# Patient Record
Sex: Male | Born: 1935
Health system: Southern US, Community
[De-identification: ages and names within clinical notes are randomized; demographics above are authoritative.]

## PROBLEM LIST (undated history)

## (undated) DIAGNOSIS — J449 Chronic obstructive pulmonary disease, unspecified: Secondary | ICD-10-CM

## (undated) DIAGNOSIS — K635 Polyp of colon: Secondary | ICD-10-CM

## (undated) DIAGNOSIS — I2510 Atherosclerotic heart disease of native coronary artery without angina pectoris: Secondary | ICD-10-CM

## (undated) DIAGNOSIS — N400 Enlarged prostate without lower urinary tract symptoms: Secondary | ICD-10-CM

## (undated) DIAGNOSIS — D649 Anemia, unspecified: Secondary | ICD-10-CM

## (undated) DIAGNOSIS — K5792 Diverticulitis of intestine, part unspecified, without perforation or abscess without bleeding: Secondary | ICD-10-CM

## (undated) DIAGNOSIS — K5790 Diverticulosis of intestine, part unspecified, without perforation or abscess without bleeding: Secondary | ICD-10-CM

## (undated) DIAGNOSIS — U071 COVID-19: Secondary | ICD-10-CM

## (undated) DIAGNOSIS — M109 Gout, unspecified: Secondary | ICD-10-CM

## (undated) DIAGNOSIS — M1990 Unspecified osteoarthritis, unspecified site: Secondary | ICD-10-CM

## (undated) DIAGNOSIS — G4730 Sleep apnea, unspecified: Secondary | ICD-10-CM

## (undated) DIAGNOSIS — K649 Unspecified hemorrhoids: Secondary | ICD-10-CM

## (undated) DIAGNOSIS — K219 Gastro-esophageal reflux disease without esophagitis: Secondary | ICD-10-CM

## (undated) DIAGNOSIS — K449 Diaphragmatic hernia without obstruction or gangrene: Secondary | ICD-10-CM

## (undated) DIAGNOSIS — Z8719 Personal history of other diseases of the digestive system: Secondary | ICD-10-CM

## (undated) DIAGNOSIS — I4891 Unspecified atrial fibrillation: Secondary | ICD-10-CM

## (undated) DIAGNOSIS — J439 Emphysema, unspecified: Secondary | ICD-10-CM

## (undated) HISTORY — DX: Personal history of other diseases of the digestive system: Z87.19

## (undated) HISTORY — DX: Gastro-esophageal reflux disease without esophagitis: K21.9

## (undated) HISTORY — DX: Unspecified hemorrhoids: K64.9

## (undated) HISTORY — PX: KIDNEY STONE SURGERY: SHX686

## (undated) HISTORY — DX: Polyp of colon: K63.5

## (undated) HISTORY — DX: Chronic obstructive pulmonary disease, unspecified: J44.9

## (undated) HISTORY — DX: Benign prostatic hyperplasia without lower urinary tract symptoms: N40.0

## (undated) HISTORY — DX: Diverticulitis of intestine, part unspecified, without perforation or abscess without bleeding: K57.92

## (undated) HISTORY — PX: HEMORROIDECTOMY: SUR656

## (undated) HISTORY — DX: Diverticulosis of intestine, part unspecified, without perforation or abscess without bleeding: K57.90

## (undated) HISTORY — DX: Atherosclerotic heart disease of native coronary artery without angina pectoris: I25.10

## (undated) HISTORY — DX: COVID-19: U07.1

## (undated) HISTORY — DX: Emphysema, unspecified: J43.9

## (undated) HISTORY — DX: Anemia, unspecified: D64.9

## (undated) HISTORY — DX: Diaphragmatic hernia without obstruction or gangrene: K44.9

## (undated) HISTORY — DX: Gout, unspecified: M10.9

## (undated) HISTORY — DX: Unspecified atrial fibrillation: I48.91

## (undated) HISTORY — DX: Unspecified osteoarthritis, unspecified site: M19.90

---

## 2011-03-05 DIAGNOSIS — J449 Chronic obstructive pulmonary disease, unspecified: Secondary | ICD-10-CM | POA: Insufficient documentation

## 2011-03-27 DIAGNOSIS — K921 Melena: Secondary | ICD-10-CM | POA: Insufficient documentation

## 2011-08-31 DIAGNOSIS — J449 Chronic obstructive pulmonary disease, unspecified: Secondary | ICD-10-CM | POA: Insufficient documentation

## 2011-08-31 DIAGNOSIS — R0600 Dyspnea, unspecified: Secondary | ICD-10-CM | POA: Insufficient documentation

## 2012-02-27 DIAGNOSIS — R21 Rash and other nonspecific skin eruption: Secondary | ICD-10-CM | POA: Insufficient documentation

## 2012-04-19 DIAGNOSIS — K222 Esophageal obstruction: Secondary | ICD-10-CM | POA: Insufficient documentation

## 2012-04-19 DIAGNOSIS — Z860100 Personal history of colon polyps, unspecified: Secondary | ICD-10-CM | POA: Insufficient documentation

## 2013-05-01 DIAGNOSIS — R109 Unspecified abdominal pain: Secondary | ICD-10-CM | POA: Insufficient documentation

## 2014-04-10 DIAGNOSIS — R1013 Epigastric pain: Secondary | ICD-10-CM | POA: Insufficient documentation

## 2014-10-19 DIAGNOSIS — K429 Umbilical hernia without obstruction or gangrene: Secondary | ICD-10-CM | POA: Insufficient documentation

## 2015-04-18 DIAGNOSIS — L309 Dermatitis, unspecified: Secondary | ICD-10-CM | POA: Insufficient documentation

## 2015-05-22 DIAGNOSIS — M1712 Unilateral primary osteoarthritis, left knee: Secondary | ICD-10-CM | POA: Insufficient documentation

## 2015-05-22 DIAGNOSIS — M7062 Trochanteric bursitis, left hip: Secondary | ICD-10-CM | POA: Insufficient documentation

## 2015-05-22 DIAGNOSIS — M19011 Primary osteoarthritis, right shoulder: Secondary | ICD-10-CM | POA: Insufficient documentation

## 2017-07-05 DIAGNOSIS — I4891 Unspecified atrial fibrillation: Secondary | ICD-10-CM | POA: Insufficient documentation

## 2017-08-26 ENCOUNTER — Ambulatory Visit (INDEPENDENT_AMBULATORY_CARE_PROVIDER_SITE_OTHER): Payer: Medicare Other | Admitting: Cardiology

## 2017-08-26 ENCOUNTER — Encounter: Payer: Self-pay | Admitting: Cardiology

## 2017-08-26 VITALS — BP 106/68 | HR 76 | Ht 65.0 in | Wt 192.0 lb

## 2017-08-26 DIAGNOSIS — I4819 Other persistent atrial fibrillation: Secondary | ICD-10-CM

## 2017-08-26 DIAGNOSIS — R079 Chest pain, unspecified: Secondary | ICD-10-CM

## 2017-08-26 DIAGNOSIS — I481 Persistent atrial fibrillation: Secondary | ICD-10-CM | POA: Diagnosis not present

## 2017-08-26 NOTE — Addendum Note (Signed)
Addended by: Baird LyonsPRICE, Renesmee Raine L on: 08/26/2017 04:06 PM   Modules accepted: Orders

## 2017-08-26 NOTE — Progress Notes (Signed)
Electrophysiology Office Note   Date:  08/26/2017   ID:  Ronnie Torres, DOB 10/12/1929, MRN [id number]  PCP:  Jim LikeWilson, Benjamin, MD  Cardiologist:   Primary Electrophysiologist:  Mihran Lebarron Jorja LoaMartin Lynnsey Barbara, MD    No chief complaint on file.    History of Present Illness: Ronnie Torres is a 84 y.o. male who is being seen today for the evaluation of atrial fibrillation at the request of [name]. Presenting today for electrophysiology evaluation.  He has a history of paroxysmal atrial fibrillation on Eliquis and amiodarone, GERD, COPD, BPH, and intermittent lower extremity edema.  His main complaint today is of weakness and fatigue.  He says that he went into atrial fibrillation a few months ago, which is when the symptoms started.  He is also been more short of breath than usual.  He has tried amiodarone, but did not stay in normal rhythm.  He is also been having some chest pain.  He has had multiple episodes in the past.  He had a Myoview which was unremarkable.  He continues to have substernal chest pain that radiates to his neck.    Today, he denies symptoms of palpitations,  orthopnea, PND, lower extremity edema, claudication, dizziness, presyncope, syncope, bleeding, or neurologic sequela. The patient is tolerating medications without difficulties.    Past Medical History:  Diagnosis Date  . Acid reflux   . Arthritis   . COPD (chronic obstructive pulmonary disease) (HCC)   . Emphysema lung (HCC)   . Hiatal hernia    History reviewed. No pertinent surgical history.   Current Outpatient Medications  Medication Sig Dispense Refill  . albuterol (PROVENTIL HFA;VENTOLIN HFA) 108 (90 Base) MCG/ACT inhaler Inhale 2 puffs into the lungs every 4 (four) hours as needed.    Marland Kitchen. apixaban (ELIQUIS) 5 MG TABS tablet Take 1 tablet by mouth 2 (two) times daily.    . budesonide-formoterol (SYMBICORT) 160-4.5 MCG/ACT inhaler Inhale 2 puffs into the lungs 2 (two) times daily.    . ferrous sulfate 325 (65  FE) MG tablet Take 1 tablet by mouth daily.    . furosemide (LASIX) 40 MG tablet Take 1 tablet by mouth daily.  1  . gabapentin (NEURONTIN) 100 MG capsule Take 1 capsule by mouth 2 (two) times daily.    . meclizine (ANTIVERT) 12.5 MG tablet Take 12.5 mg by mouth 3 (three) times daily as needed for dizziness.    . metoprolol tartrate (LOPRESSOR) 25 MG tablet Take 0.5 tablets by mouth 2 (two) times daily.    . midodrine (PROAMATINE) 2.5 MG tablet Take 1 tablet by mouth 2 (two) times daily.    Marland Kitchen. omeprazole (PRILOSEC) 20 MG capsule Take 1 capsule by mouth 2 (two) times daily.    . tamsulosin (FLOMAX) 0.4 MG CAPS capsule Take 1 capsule by mouth 2 (two) times daily.     No current facility-administered medications for this visit.     Allergies:   Shellfish allergy; Sulfa antibiotics; and Trazodone and nefazodone   Social History:  The patient  reports that he quit smoking about 44 years ago. He has never used smokeless tobacco. He reports that he does not drink alcohol or use drugs.   Family History:  The patient's family history includes Atrial fibrillation in his brother; CVA in his father; Cancer in his brother; Dementia in his sister and sister; Other in his mother; Pancreatic cancer in his brother.    ROS:  Please see the history of present illness.   Otherwise, review  of systems is positive for leg swelling, palpitations, hearing loss, shortness of breath, back pain, muscle pain, balance problems, rash, easy bruising.   All other systems are reviewed and negative.    PHYSICAL EXAM: VS:  BP 106/68   Pulse 76   Ht 5\' 5"  (1.651 m)   Wt 192 lb (87.1 kg)   SpO2 94%   BMI 31.95 kg/m  , BMI Body mass index is 31.95 kg/m. GEN: Well nourished, well developed, in no acute distress  HEENT: normal  Neck: no JVD, carotid bruits, or masses Cardiac: iRRR; no murmurs, rubs, or gallops,no edema  Respiratory:  clear to auscultation bilaterally, normal work of breathing GI: soft, nontender,  nondistended, + BS MS: no deformity or atrophy  Skin: warm and dry Neuro:  Strength and sensation are intact Psych: euthymic mood, full affect  EKG:  EKG is ordered today. Personal review of the ekg ordered shows atrial fibrillation, rate 76, PVCs multifocal    Recent Labs: No results found for requested labs within last 8760 hours.    Lipid Panel  No results found for: CHOL, TRIG, HDL, CHOLHDL, VLDL, LDLCALC, LDLDIRECT   Wt Readings from Last 3 Encounters:  08/26/17 192 lb (87.1 kg)      Other studies Reviewed: Additional studies/ records that were reviewed today include: Myoview 06/29/2017 Review of the above records today demonstrates:  No inducible ischemia No wall motion abnormalities Normal left ventricular function   ASSESSMENT AND PLAN:  1.  Persistent atrial fibrillation: Has tried amiodarone in the past which did not convert him to sinus rhythm.  At this point, it does not appear that he has had an echocardiogram.  [name] order one today.  He is also weak, fatigue, and short of breath which could be due to his atrial fibrillation.  He [name] likely require rhythm control.  We [name] try with dofetilide pending his echo.  Continue Eliquis  This patients CHA2DS2-VASc Score and unadjusted Ischemic Stroke Rate (% per year) is equal to 2.2 % stroke rate/year from a score of 2  Above score calculated as 1 point each if present [CHF, HTN, DM, Vascular=MI/PAD/Aortic Plaque, Age if 65-74, or Male] Above score calculated as 2 points each if present [Age > 75, or Stroke/TIA/TE]   2.  Chest pain: Currently unclear as to the cause.  He has had a negative Myoview but does continue to have significant symptoms that do sound to be somewhat cardiac in nature.  [name] order a coronary CT.    Current medicines are reviewed at length with the patient today.   The patient does not have concerns regarding his medicines.  The following changes were made today:  none  Labs/ tests ordered  today include:  Orders Placed This Encounter  Procedures  . CT CORONARY MORPH W/CTA COR W/SCORE W/CA W/CM &/OR WO/CM  . CT CORONARY FRACTIONAL FLOW RESERVE DATA PREP  . CT CORONARY FRACTIONAL FLOW RESERVE FLUID ANALYSIS  . EKG 12-Lead  . ECHOCARDIOGRAM COMPLETE   Case discussed with PCP  Disposition:   FU with Jaqueline Uber 3 months  Signed, Latoria Dry Jorja Loa, MD  08/26/2017 3:08 PM     [facility] [address] [place] [address] [phone] (office) [phone] (fax)

## 2017-08-26 NOTE — Addendum Note (Signed)
Addended by: Baird LyonsPRICE, Tamey Wanek L on: 08/26/2017 03:32 PM   Modules accepted: Orders

## 2017-08-26 NOTE — Patient Instructions (Addendum)
Medication Instructions:  Your physician recommends that you continue on your current medications as directed. Please refer to the Current Medication list given to you today.  * If you need a refill on your cardiac medications before your next appointment, please call your pharmacy.   Labwork: None ordered  Testing/Procedures: Your physician has requested that you have cardiac CT. Cardiac computed tomography (CT) is a painless test that uses an x-ray machine to take clear, detailed pictures of your heart. For further information please visit [email or web address]. Please follow instructions below located under special instructions area  Your physician has requested that you have an echocardiogram. Echocardiography is a painless test that uses sound waves to create images of your heart. It provides your doctor with information about the size and shape of your heart and how well your heart's chambers and valves are working. This procedure takes approximately one hour. There are no restrictions for this procedure.  Follow-Up: Your physician recommends that you schedule a follow-up appointment in: 3 months with Dr. Elberta Fortisamnitz.  *Please note that any paperwork needing to be filled out by the provider will need to be addressed at the front desk prior to seeing the provider. Please note that any FMLA, disability or other documents regarding health condition is subject to a $25.00 charge that must be received prior to completion of paperwork in the form of a money order or check.  Thank you for choosing [facility]!!   Dory HornSherri Gloriana Piltz, RN [phone]  Any Other Special Instructions Will Be Listed Below (If Applicable).   Please arrive at the [facility] main entrance of [facility] at xx:xx AM (30-45 minutes prior to test start time)  [facility] [address] [place], [place] [address] [phone]  Proceed to the [facility] Radiology Department (First  Floor).  Please follow these instructions carefully (unless otherwise directed):  Hold all erectile dysfunction medications at least 48 hours prior to test.  On the Night Before the Test: . Drink plenty of water. . Do not consume any caffeinated/decaffeinated beverages or chocolate 12 hours prior to your test. . Do not take any antihistamines 12 hours prior to your test. . If you take Metformin do not take 24 hours prior to test. . If the patient has contrast allergy: ? Patient will need a prescription for Prednisone and very clear instructions (as follows): 1. Prednisone 50 mg - take 13 hours prior to test 2. Take another Prednisone 50 mg 7 hours prior to test 3. Take another Prednisone 50 mg 1 hour prior to test 4. Take Benadryl 50 mg 1 hour prior to test . Patient must complete all four doses of above prophylactic medications. . Patient will need a ride after test due to Benadryl.  On the Day of the Test: . Drink plenty of water. Do not drink any water within one hour of the test. . Do not eat any food 4 hours prior to the test. . You may take your regular medications prior to the test. . Take 1 whole tablet (25 mg total) of Lopressor the morning of this testing. Marland Kitchen. HOLD Furosemide morning of the test.  After the Test: . Drink plenty of water. . After receiving IV contrast, you may experience a mild flushed feeling. This is normal. . On occasion, you may experience a mild rash up to 24 hours after the test. This is not dangerous. If this occurs, you can take Benadryl 25 mg and increase your fluid intake. .Marland Kitchen  If you experience trouble breathing, this can be serious. If it is severe call 911 IMMEDIATELY. If it is mild, please call our office. . If you take any of these medications: Glipizide/Metformin, Avandament, Glucavance, please do not take 48 hours after completing test.     Cardiac CT Angiogram A cardiac CT angiogram is a procedure to look at the heart and the area around  the heart. It may be done to help find the cause of chest pains or other symptoms of heart disease. During this procedure, a large X-ray machine, called a CT scanner, takes detailed pictures of the heart and the surrounding area after a dye (contrast material) has been injected into blood vessels in the area. The procedure is also sometimes called a coronary CT angiogram, coronary artery scanning, or CTA. A cardiac CT angiogram allows the health care provider to see how well blood is flowing to and from the heart. The health care provider will be able to see if there are any problems, such as:  Blockage or narrowing of the coronary arteries in the heart.  Fluid around the heart.  Signs of weakness or disease in the muscles, valves, and tissues of the heart.  Tell a health care provider about:  Any allergies you have. This is especially important if you have had a previous allergic reaction to contrast dye.  All medicines you are taking, including vitamins, herbs, eye drops, creams, and over-the-counter medicines.  Any blood disorders you have.  Any surgeries you have had.  Any medical conditions you have.  Whether you are pregnant or may be pregnant.  Any anxiety disorders, chronic pain, or other conditions you have that may increase your stress or prevent you from lying still. What are the risks? Generally, this is a safe procedure. However, problems may occur, including:  Bleeding.  Infection.  Allergic reactions to medicines or dyes.  Damage to other structures or organs.  Kidney damage from the dye or contrast that is used.  Increased risk of cancer from radiation exposure. This risk is low. Talk with your health care provider about: ? The risks and benefits of testing. ? How you can receive the lowest dose of radiation.  What happens before the procedure?  Wear comfortable clothing and remove any jewelry, glasses, dentures, and hearing aids.  Follow instructions from  your health care provider about eating and drinking. This may include: ? For 12 hours before the test - avoid caffeine. This includes tea, coffee, soda, energy drinks, and diet pills. Drink plenty of water or other fluids that do not have caffeine in them. Being well-hydrated can prevent complications. ? For 4-6 hours before the test - stop eating and drinking. The contrast dye can cause nausea, but this is less likely if your stomach is empty.  Ask your health care provider about changing or stopping your regular medicines. This is especially important if you are taking diabetes medicines, blood thinners, or medicines to treat erectile dysfunction. What happens during the procedure?  Hair on your chest may need to be removed so that small sticky patches called electrodes can be placed on your chest. These will transmit information that helps to monitor your heart during the test.  An IV tube will be inserted into one of your veins.  You might be given a medicine to control your heart rate during the test. This will help to ensure that good images are obtained.  You will be asked to lie on an exam table. This  table will slide in and out of the CT machine during the procedure.  Contrast dye will be injected into the IV tube. You might feel warm, or you may get a metallic taste in your mouth.  You will be given a medicine (nitroglycerin) to relax (dilate) the arteries in your heart.  The table that you are lying on will move into the CT machine tunnel for the scan.  The person running the machine will give you instructions while the scans are being done. You may be asked to: ? Keep your arms above your head. ? Hold your breath. ? Stay very still, even if the table is moving.  When the scanning is complete, you will be moved out of the machine.  The IV tube will be removed. The procedure may vary among health care providers and hospitals. What happens after the procedure?  You might feel  warm, or you may get a metallic taste in your mouth from the contrast dye.  You may have a headache from the nitroglycerin.  After the procedure, drink water or other fluids to wash (flush) the contrast material out of your body.  Contact a health care provider if you have any symptoms of allergy to the contrast. These symptoms include: ? Shortness of breath. ? Rash or hives. ? A racing heartbeat.  Most people can return to their normal activities right after the procedure. Ask your health care provider what activities are safe for you.  It is up to you to get the results of your procedure. Ask your health care provider, or the department that is doing the procedure, when your results will be ready. Summary  A cardiac CT angiogram is a procedure to look at the heart and the area around the heart. It may be done to help find the cause of chest pains or other symptoms of heart disease.  During this procedure, a large X-ray machine, called a CT scanner, takes detailed pictures of the heart and the surrounding area after a dye (contrast material) has been injected into blood vessels in the area.  Ask your health care provider about changing or stopping your regular medicines before the procedure. This is especially important if you are taking diabetes medicines, blood thinners, or medicines to treat erectile dysfunction.  After the procedure, drink water or other fluids to wash (flush) the contrast material out of your body. This information is not intended to replace advice given to you by your health care provider. Make sure you discuss any questions you have with your health care provider. Document Released: 12/26/2007 Document Revised: 12/02/2015 Document Reviewed: 12/02/2015 Elsevier Interactive Patient Education  2017 [employer].

## 2017-08-31 ENCOUNTER — Other Ambulatory Visit (HOSPITAL_COMMUNITY): Payer: Medicare Other

## 2017-09-03 ENCOUNTER — Other Ambulatory Visit: Payer: Self-pay | Admitting: *Deleted

## 2017-09-03 DIAGNOSIS — I4819 Other persistent atrial fibrillation: Secondary | ICD-10-CM

## 2017-09-03 DIAGNOSIS — I209 Angina pectoris, unspecified: Secondary | ICD-10-CM

## 2017-09-03 DIAGNOSIS — R072 Precordial pain: Secondary | ICD-10-CM

## 2017-09-08 ENCOUNTER — Telehealth: Payer: Self-pay | Admitting: *Deleted

## 2017-09-08 NOTE — Telephone Encounter (Signed)
lmtcb

## 2017-09-08 NOTE — Telephone Encounter (Signed)
Ronnie Torres, Ronnie Torres  Abdurahman Rugg L, RN        Schedule for 10-01-17 @ 1:30 for Ct and 3:00 for the Echo   Patient is going to need Labs - Last labs were done 7-18 [facility] Phone # [phone] /Fx [phone] Dr. Jill AlexandersBen Wilson   I told patient daughter Ms. Kizzie BaneHughes that he might need labs and you will let her know  [phone].

## 2017-09-09 NOTE — Telephone Encounter (Signed)
Informed dtr, Misty StanleyLisa, that pt needed BMET prior to CT. She will speak w/ pt about getting done at PCP soon and having result faxed to our office.

## 2017-09-10 ENCOUNTER — Other Ambulatory Visit (HOSPITAL_COMMUNITY): Payer: Medicare Other

## 2017-09-23 ENCOUNTER — Telehealth: Payer: Self-pay | Admitting: Cardiology

## 2017-09-23 NOTE — Telephone Encounter (Signed)
New Message:     Pt is going to have his blood work done on  09/28/17 at the PCP Office.

## 2017-09-24 NOTE — Telephone Encounter (Signed)
Called to follow up with dtr that I didn't need to fax any order to their office.  She tells me that I don't need to send any orders.

## 2017-10-01 ENCOUNTER — Ambulatory Visit (HOSPITAL_COMMUNITY)
Admission: RE | Admit: 2017-10-01 | Discharge: 2017-10-01 | Disposition: A | Discharge status: Home / Self Care | Payer: Medicare Other | Source: Ambulatory Visit | Attending: Cardiology | Admitting: Cardiology

## 2017-10-01 DIAGNOSIS — I481 Persistent atrial fibrillation: Secondary | ICD-10-CM | POA: Diagnosis present

## 2017-10-01 DIAGNOSIS — R072 Precordial pain: Secondary | ICD-10-CM | POA: Insufficient documentation

## 2017-10-01 DIAGNOSIS — K449 Diaphragmatic hernia without obstruction or gangrene: Secondary | ICD-10-CM | POA: Diagnosis not present

## 2017-10-01 DIAGNOSIS — I4819 Other persistent atrial fibrillation: Secondary | ICD-10-CM

## 2017-10-01 DIAGNOSIS — J449 Chronic obstructive pulmonary disease, unspecified: Secondary | ICD-10-CM | POA: Diagnosis not present

## 2017-10-01 DIAGNOSIS — I361 Nonrheumatic tricuspid (valve) insufficiency: Secondary | ICD-10-CM | POA: Insufficient documentation

## 2017-10-01 DIAGNOSIS — I700 Atherosclerosis of aorta: Secondary | ICD-10-CM | POA: Insufficient documentation

## 2017-10-01 LAB — ECHOCARDIOGRAM COMPLETE

## 2017-10-01 MED ORDER — METOPROLOL TARTRATE 5 MG/5ML IV SOLN
INTRAVENOUS | Status: AC
  Administered 2017-10-01: 5 mg
  Filled 2017-10-01: qty 20

## 2017-10-01 MED ORDER — IOPAMIDOL (ISOVUE-370) INJECTION 76%
100.0000 mL | Freq: Once | INTRAVENOUS | Status: AC | PRN
  Administered 2017-10-01: 80 mL via INTRAVENOUS

## 2017-10-01 MED ORDER — NITROGLYCERIN 0.4 MG SL SUBL
SUBLINGUAL_TABLET | SUBLINGUAL | Status: AC
  Administered 2017-10-01: 0.8 mg
  Filled 2017-10-01: qty 2

## 2017-10-01 NOTE — Progress Notes (Signed)
Ct complete. Patient denies feeling dizzy or lightheaded. BP: 84/64. Patients feet elevated. Patient eating cookies and having coffee.

## 2017-10-01 NOTE — Progress Notes (Signed)
Patient stated he feels normal. Denies any complaints. Patient d/c and taken our via wheelchair to family. Family with patient.

## 2017-10-01 NOTE — Progress Notes (Signed)
  Echocardiogram 2D Echocardiogram has been performed.  Ronnie Torres F 10/01/2017, 3:55 PM

## 2017-10-05 ENCOUNTER — Telehealth: Payer: Self-pay | Admitting: Cardiology

## 2017-10-05 NOTE — Telephone Encounter (Signed)
dtr Informed of echo & CT results (DPR on file). Scheduled to discuss further on 9/30 w/ WC

## 2017-10-05 NOTE — Telephone Encounter (Signed)
New Message   Patients daughter is calling to obtain the results of the patients echocardiogram and CT Please call

## 2017-10-25 ENCOUNTER — Encounter: Payer: Self-pay | Admitting: *Deleted

## 2017-10-25 ENCOUNTER — Ambulatory Visit (INDEPENDENT_AMBULATORY_CARE_PROVIDER_SITE_OTHER): Payer: Medicare Other | Admitting: Cardiology

## 2017-10-25 ENCOUNTER — Encounter: Payer: Self-pay | Admitting: Cardiology

## 2017-10-25 VITALS — BP 118/64 | HR 81 | Ht 65.0 in | Wt 190.0 lb

## 2017-10-25 DIAGNOSIS — I2510 Atherosclerotic heart disease of native coronary artery without angina pectoris: Secondary | ICD-10-CM | POA: Diagnosis not present

## 2017-10-25 DIAGNOSIS — Z01812 Encounter for preprocedural laboratory examination: Secondary | ICD-10-CM | POA: Diagnosis not present

## 2017-10-25 DIAGNOSIS — I4819 Other persistent atrial fibrillation: Secondary | ICD-10-CM

## 2017-10-25 DIAGNOSIS — I481 Persistent atrial fibrillation: Secondary | ICD-10-CM

## 2017-10-25 NOTE — Patient Instructions (Addendum)
Medication Instructions:  Your physician recommends that you continue on your current medications as directed. Please refer to the Current Medication list given to you today.  * If you need a refill on your cardiac medications before your next appointment, please call your pharmacy.   Labwork: Pre procedure labs today: BMET & CBC *We will only notify you of abnormal results, otherwise continue current treatment plan.  Testing/Procedures: Your physician has requested that you have a cardiac catheterization. Cardiac catheterization is used to diagnose and/or treat various heart conditions. Doctors may recommend this procedure for a number of different reasons. The most common reason is to evaluate chest pain. Chest pain can be a symptom of coronary artery disease (CAD), and cardiac catheterization can show whether plaque is narrowing or blocking your heart's arteries. This procedure is also used to evaluate the valves, as well as measure the blood flow and oxygen levels in different parts of your heart. For further information please visit [email or web address] Please review the instructions below.   Follow-Up: Your physician recommends that you schedule a follow-up appointment with Dr. Elberta Fortis after your catheterization on 11/02/17   *Please note that any paperwork needing to be filled out by the provider will need to be addressed at the front desk prior to seeing the provider. Please note that any FMLA, disability or other documents regarding health condition is subject to a $25.00 charge that must be received prior to completion of paperwork in the form of a money order or check.  Thank you for choosing [facility]!!   Dory Horn, RN [phone]  Any Other Special Instructions Will Be Listed Below (If Applicable).     [facility] CARDIOVASCULAR DIVISION [facility] ST OFFICE [address] [place], [address] [place] [address] Dept:  [phone] Loc: [phone]  Amous Crewe  10/25/2017  You are scheduled for a Cardiac Catheterization on Tuesday, October 8 with Dr. Tonny Bollman.  1. Please arrive at the [facility] (Main Entrance A) at [facility]: [address] [place], [place] [address] at 5:30 AM (This time is two hours before your procedure to ensure your preparation). Free valet parking service is available.   Special note: Every effort is made to have your procedure done on time. Please understand that emergencies sometimes delay scheduled procedures.  2. Diet: Do not eat solid foods after midnight.  The patient may have clear liquids until 5am upon the day of the procedure.  3. Labs: You will need to have blood drawn on Monday, September 30 at [facility]. [address], [place]  Open: 7:30am - 5pm    Phone: [phone]. You do not need to be fasting.  4. Medication instructions in preparation for your procedure:   Contrast Allergy: No  Stop taking Eliquis (Apixiban) on Saturday, October 5. - take your last dose that evening  Stop taking, Lasix (Furosemide)  on Tuesday, October 5.   On the morning of your procedure, take Aspirin and any morning medicines NOT listed above.  You may use sips of wayour ter.  5. Plan for one night stay--bring personal belongings. 6. Bring a current list of your medications and current insurance cards. 7. You MUST have a responsible person to drive you home. 8. Someone MUST be with you the first 24 hours after you arrive home or your discharge will be delayed. 9. Please wear clothes that are easy to get on and off and wear slip-on  shoes.  Thank you for allowing [place] to care for you!   -- [facility]

## 2017-10-25 NOTE — Addendum Note (Signed)
Addended by: Tonita Phoenix on: 10/25/2017 03:10 PM   Modules accepted: Orders

## 2017-10-25 NOTE — H&P (View-Only) (Signed)
Electrophysiology Office Note   Date:  10/25/2017   ID:  Ronnie Torres, DOB Dec 23, 1929, MRN [id number]  PCP:  Jim Like, MD  Cardiologist:   Primary Electrophysiologist:  Will Jorja Loa, MD    No chief complaint on file.    History of Present Illness: Ronnie Torres is a 84 y.o. male who is being seen today for the evaluation of atrial fibrillation at the request of Ronnie Torres. Presenting today for electrophysiology evaluation.  He has a history of paroxysmal atrial fibrillation on Eliquis and amiodarone, GERD, COPD, BPH, and intermittent lower extremity edema.  His main complaint today is of weakness and fatigue.  He says that he went into atrial fibrillation a few months ago, which is when the symptoms started.  He is also been more short of breath than usual.  He has tried amiodarone, but did not stay in normal rhythm.  He is also been having some chest pain.  He has had multiple episodes in the past.  He had a Myoview which was unremarkable.  He continues to have substernal chest pain that radiates to his neck.  Today, denies symptoms of shortness of breath, orthopnea, PND, lower extremity edema, claudication, dizziness, presyncope, syncope, bleeding, or neurologic sequela. The patient is tolerating medications without difficulties.  He continues to have chest pain and fatigue.  His chest pain is the same as it was prior to his CT scan.  CT scan shows significant coronary artery disease with occlusion of his LAD and collaterals distally.  He also continues to have fatigue that he attributes to his atrial fibrillation.  Past Medical History:  Diagnosis Date  . Acid reflux   . Arthritis   . COPD (chronic obstructive pulmonary disease) (HCC)   . Emphysema lung (HCC)   . Hiatal hernia    History reviewed. No pertinent surgical history.   Current Outpatient Medications  Medication Sig Dispense Refill  . albuterol (PROVENTIL HFA;VENTOLIN HFA) 108 (90 Base) MCG/ACT inhaler Inhale  2 puffs into the lungs every 4 (four) hours as needed.    Marland Kitchen apixaban (ELIQUIS) 5 MG TABS tablet Take 1 tablet by mouth 2 (two) times daily.    . budesonide-formoterol (SYMBICORT) 160-4.5 MCG/ACT inhaler Inhale 2 puffs into the lungs 2 (two) times daily.    . ferrous sulfate 325 (65 FE) MG tablet Take 1 tablet by mouth daily.    . furosemide (LASIX) 40 MG tablet Take 1 tablet by mouth daily.  1  . gabapentin (NEURONTIN) 100 MG capsule Take 1 capsule by mouth 2 (two) times daily.    . meclizine (ANTIVERT) 12.5 MG tablet Take 12.5 mg by mouth 3 (three) times daily as needed for dizziness.    . metoprolol tartrate (LOPRESSOR) 25 MG tablet Take 0.5 tablets by mouth 2 (two) times daily.    . midodrine (PROAMATINE) 2.5 MG tablet Take 1 tablet by mouth 2 (two) times daily.    Marland Kitchen omeprazole (PRILOSEC) 20 MG capsule Take 1 capsule by mouth 2 (two) times daily.    . tamsulosin (FLOMAX) 0.4 MG CAPS capsule Take 1 capsule by mouth 2 (two) times daily.     No current facility-administered medications for this visit.     Allergies:   Shellfish allergy; Tape; Sulfa antibiotics; and Trazodone and nefazodone   Social History:  The patient  reports that he quit smoking about 44 years ago. He has never used smokeless tobacco. He reports that he does not drink alcohol or use drugs.   Family  History:  The patient's family history includes Atrial fibrillation in his brother; CVA in his father; Cancer in his brother; Dementia in his sister and sister; Other in his mother; Pancreatic cancer in his brother.   ROS:  Please see the history of present illness.   Otherwise, review of systems is positive for none.   All other systems are reviewed and negative.   PHYSICAL EXAM: VS:  BP 118/64   Pulse 81   Ht 5\' 5"  (1.651 m)   Wt 190 lb (86.2 kg)   SpO2 96%   BMI 31.62 kg/m  , BMI Body mass index is 31.62 kg/m. GEN: Well nourished, well developed, in no acute distress  HEENT: normal  Neck: no JVD, carotid bruits, or  masses Cardiac: iRRR; no murmurs, rubs, or gallops,no edema  Respiratory:  clear to auscultation bilaterally, normal work of breathing GI: soft, nontender, nondistended, + BS MS: no deformity or atrophy  Skin: warm and dry Neuro:  Strength and sensation are intact Psych: euthymic mood, full affect  EKG:  EKG is not ordered today. Personal review of the ekg ordered 08/26/17 shows AF, PVCs   Recent Labs: No results found for requested labs within last 8760 hours.    Lipid Panel  No results found for: CHOL, TRIG, HDL, CHOLHDL, VLDL, LDLCALC, LDLDIRECT   Wt Readings from Last 3 Encounters:  10/25/17 190 lb (86.2 kg)  08/26/17 192 lb (87.1 kg)      Other studies Reviewed: Additional studies/ records that were reviewed today include: Myoview 06/29/2017 Review of the above records today demonstrates:  No inducible ischemia No wall motion abnormalities Normal left ventricular function  TTE 10/01/17 - Left ventricle: The cavity size was normal. There was mild   concentric hypertrophy. Systolic function was mildly reduced. The   estimated ejection fraction was in the range of 45% to 50%.   Diffuse hypokinesis. - Aortic valve: Transvalvular velocity was within the normal range.   There was no stenosis. There was no regurgitation. - Mitral valve: Moderately calcified annulus. Calcification.   Transvalvular velocity was within the normal range. There was no   evidence for stenosis. There was trivial regurgitation. - Left atrium: The atrium was moderately dilated. - Right ventricle: The cavity size was normal. Wall thickness was   normal. Systolic function was normal. - Tricuspid valve: There was mild regurgitation. - Pulmonary arteries: Systolic pressure was within the normal   range. PA peak pressure: 18 mm Hg (S).  Coronary CTA 1. Coronary artery calcium score 284 Agatston units, placing the patient in the 35th percentile for age and gender.  2. The proximal LAD is occluded after  2 small diagonals. There is some filling of the distal LAD by collaterals.  ASSESSMENT AND PLAN:  1.  Persistent atrial fibrillation: Echo shows a moderately dilated left atrium.  He is currently on Eliquis.  He will likely require admission for Tikosyn in the future.  He does have coronary disease and are planning left heart catheterization.  Will plan for admission once he is back on his anticoagulation.  This patients CHA2DS2-VASc Score and unadjusted Ischemic Stroke Rate (% per year) is equal to 2.2 % stroke rate/year from a score of 2  Above score calculated as 1 point each if present [CHF, HTN, DM, Vascular=MI/PAD/Aortic Plaque, Age if 65-74, or Male] Above score calculated as 2 points each if present [Age > 75, or Stroke/TIA/TE]   2.  Coronary artery disease: Found on coronary CT with an occluded LAD  after 2 small diagonals with some filling of the distal LAD by collaterals.  At this point, I feel that he needs left heart catheterization.  Risks and benefits were discussed and the patient has agreed to the procedure.  The patient understands that risks include but are not limited to stroke (1 in 1000), death (1 in 1000), kidney failure [usually temporary] (1 in 500), bleeding (1 in 200), allergic reaction [possibly serious] (1 in 200), and agrees to proceed.    Current medicines are reviewed at length with the patient today.   The patient does not have concerns regarding his medicines.  The following changes were made today:  none  Labs/ tests ordered today include:  Orders Placed This Encounter  Procedures  . Basic metabolic panel  . CBC     Disposition:   FU with Will [name] 1.5 months  Signed, Will Jorja Loa, MD  10/25/2017 2:58 PM     [facility] [address] [place] [address] [phone] (office) [phone] (fax)

## 2017-10-25 NOTE — Progress Notes (Signed)
 Electrophysiology Office Note   Date:  10/25/2017   ID:  Ronnie Torres, DOB 01/07/1930, MRN [id number]  PCP:  Wilson, Benjamin, MD  Cardiologist:   Primary Electrophysiologist:  Carneshia Raker Martin Iyanla Eilers, MD    No chief complaint on file.    History of Present Illness: Ronnie Torres is a 84 y.o. male who is being seen today for the evaluation of atrial fibrillation at the request of Laura Glick. Presenting today for electrophysiology evaluation.  He has a history of paroxysmal atrial fibrillation on Eliquis and amiodarone, GERD, COPD, BPH, and intermittent lower extremity edema.  His main complaint today is of weakness and fatigue.  He says that he went into atrial fibrillation a few months ago, which is when the symptoms started.  He is also been more short of breath than usual.  He has tried amiodarone, but did not stay in normal rhythm.  He is also been having some chest pain.  He has had multiple episodes in the past.  He had a Myoview which was unremarkable.  He continues to have substernal chest pain that radiates to his neck.  Today, denies symptoms of shortness of breath, orthopnea, PND, lower extremity edema, claudication, dizziness, presyncope, syncope, bleeding, or neurologic sequela. The patient is tolerating medications without difficulties.  He continues to have chest pain and fatigue.  His chest pain is the same as it was prior to his CT scan.  CT scan shows significant coronary artery disease with occlusion of his LAD and collaterals distally.  He also continues to have fatigue that he attributes to his atrial fibrillation.  Past Medical History:  Diagnosis Date  . Acid reflux   . Arthritis   . COPD (chronic obstructive pulmonary disease) (HCC)   . Emphysema lung (HCC)   . Hiatal hernia    History reviewed. No pertinent surgical history.   Current Outpatient Medications  Medication Sig Dispense Refill  . albuterol (PROVENTIL HFA;VENTOLIN HFA) 108 (90 Base) MCG/ACT inhaler Inhale  2 puffs into the lungs every 4 (four) hours as needed.    . apixaban (ELIQUIS) 5 MG TABS tablet Take 1 tablet by mouth 2 (two) times daily.    . budesonide-formoterol (SYMBICORT) 160-4.5 MCG/ACT inhaler Inhale 2 puffs into the lungs 2 (two) times daily.    . ferrous sulfate 325 (65 FE) MG tablet Take 1 tablet by mouth daily.    . furosemide (LASIX) 40 MG tablet Take 1 tablet by mouth daily.  1  . gabapentin (NEURONTIN) 100 MG capsule Take 1 capsule by mouth 2 (two) times daily.    . meclizine (ANTIVERT) 12.5 MG tablet Take 12.5 mg by mouth 3 (three) times daily as needed for dizziness.    . metoprolol tartrate (LOPRESSOR) 25 MG tablet Take 0.5 tablets by mouth 2 (two) times daily.    . midodrine (PROAMATINE) 2.5 MG tablet Take 1 tablet by mouth 2 (two) times daily.    . omeprazole (PRILOSEC) 20 MG capsule Take 1 capsule by mouth 2 (two) times daily.    . tamsulosin (FLOMAX) 0.4 MG CAPS capsule Take 1 capsule by mouth 2 (two) times daily.     No current facility-administered medications for this visit.     Allergies:   Shellfish allergy; Tape; Sulfa antibiotics; and Trazodone and nefazodone   Social History:  The patient  reports that he quit smoking about 44 years ago. He has never used smokeless tobacco. He reports that he does not drink alcohol or use drugs.   Family   History:  The patient's family history includes Atrial fibrillation in his brother; CVA in his father; Cancer in his brother; Dementia in his sister and sister; Other in his mother; Pancreatic cancer in his brother.   ROS:  Please see the history of present illness.   Otherwise, review of systems is positive for none.   All other systems are reviewed and negative.   PHYSICAL EXAM: VS:  BP 118/64   Pulse 81   Ht 5' 5" (1.651 m)   Wt 190 lb (86.2 kg)   SpO2 96%   BMI 31.62 kg/m  , BMI Body mass index is 31.62 kg/m. GEN: Well nourished, well developed, in no acute distress  HEENT: normal  Neck: no JVD, carotid bruits, or  masses Cardiac: iRRR; no murmurs, rubs, or gallops,no edema  Respiratory:  clear to auscultation bilaterally, normal work of breathing GI: soft, nontender, nondistended, + BS MS: no deformity or atrophy  Skin: warm and dry Neuro:  Strength and sensation are intact Psych: euthymic mood, full affect  EKG:  EKG is not ordered today. Personal review of the ekg ordered 08/26/17 shows AF, PVCs   Recent Labs: No results found for requested labs within last 8760 hours.    Lipid Panel  No results found for: CHOL, TRIG, HDL, CHOLHDL, VLDL, LDLCALC, LDLDIRECT   Wt Readings from Last 3 Encounters:  10/25/17 190 lb (86.2 kg)  08/26/17 192 lb (87.1 kg)      Other studies Reviewed: Additional studies/ records that were reviewed today include: Myoview 06/29/2017 Review of the above records today demonstrates:  No inducible ischemia No wall motion abnormalities Normal left ventricular function  TTE 10/01/17 - Left ventricle: The cavity size was normal. There was mild   concentric hypertrophy. Systolic function was mildly reduced. The   estimated ejection fraction was in the range of 45% to 50%.   Diffuse hypokinesis. - Aortic valve: Transvalvular velocity was within the normal range.   There was no stenosis. There was no regurgitation. - Mitral valve: Moderately calcified annulus. Calcification.   Transvalvular velocity was within the normal range. There was no   evidence for stenosis. There was trivial regurgitation. - Left atrium: The atrium was moderately dilated. - Right ventricle: The cavity size was normal. Wall thickness was   normal. Systolic function was normal. - Tricuspid valve: There was mild regurgitation. - Pulmonary arteries: Systolic pressure was within the normal   range. PA peak pressure: 18 mm Hg (S).  Coronary CTA 1. Coronary artery calcium score 284 Agatston units, placing the patient in the 35th percentile for age and gender.  2. The proximal LAD is occluded after  2 small diagonals. There is some filling of the distal LAD by collaterals.  ASSESSMENT AND PLAN:  1.  Persistent atrial fibrillation: Echo shows a moderately dilated left atrium.  He is currently on Eliquis.  He Camari Quintanilla likely require admission for Tikosyn in the future.  He does have coronary disease and are planning left heart catheterization.  Laquandra Carrillo plan for admission once he is back on his anticoagulation.  This patients CHA2DS2-VASc Score and unadjusted Ischemic Stroke Rate (% per year) is equal to 2.2 % stroke rate/year from a score of 2  Above score calculated as 1 point each if present [CHF, HTN, DM, Vascular=MI/PAD/Aortic Plaque, Age if 65-74, or Male] Above score calculated as 2 points each if present [Age > 75, or Stroke/TIA/TE]   2.  Coronary artery disease: Found on coronary CT with an occluded LAD   after 2 small diagonals with some filling of the distal LAD by collaterals.  At this point, I feel that he needs left heart catheterization.  Risks and benefits were discussed and the patient has agreed to the procedure.  The patient understands that risks include but are not limited to stroke (1 in 1000), death (1 in 1000), kidney failure [usually temporary] (1 in 500), bleeding (1 in 200), allergic reaction [possibly serious] (1 in 200), and agrees to proceed.    Current medicines are reviewed at length with the patient today.   The patient does not have concerns regarding his medicines.  The following changes were made today:  none  Labs/ tests ordered today include:  Orders Placed This Encounter  Procedures  . Basic metabolic panel  . CBC     Disposition:   FU with Nyeli Holtmeyer 1.5 months  Signed, Jazir Newey Martin Krystol Rocco, MD  10/25/2017 2:58 PM     [facility] [address] [place] [address] [phone] (office) [phone] (fax) 

## 2017-10-26 LAB — CBC
HEMATOCRIT: 42.1 % (ref 37.5–51.0)
HEMOGLOBIN: 13.8 g/dL (ref 13.0–17.7)
MCH: 27.4 pg (ref 26.6–33.0)
MCHC: 32.8 g/dL (ref 31.5–35.7)
MCV: 84 fL (ref 79–97)
Platelets: 128 10*3/uL — ABNORMAL LOW (ref 150–450)
RBC: 5.03 x10E6/uL (ref 4.14–5.80)
RDW: 14.7 % (ref 12.3–15.4)
WBC: 7.7 10*3/uL (ref 3.4–10.8)

## 2017-10-26 LAB — BASIC METABOLIC PANEL
BUN/Creatinine Ratio: 16 (ref 10–24)
BUN: 20 mg/dL (ref 8–27)
CALCIUM: 9.8 mg/dL (ref 8.6–10.2)
CO2: 26 mmol/L (ref 20–29)
CREATININE: 1.22 mg/dL (ref 0.76–1.27)
Chloride: 106 mmol/L (ref 96–106)
GFR calc Af Amer: 62 mL/min/{1.73_m2} (ref >59)
GFR, EST NON AFRICAN AMERICAN: 53 mL/min/{1.73_m2} — AB (ref >59)
Glucose: 82 mg/dL (ref 65–99)
Potassium: 4.2 mmol/L (ref 3.5–5.2)
Sodium: 145 mmol/L — ABNORMAL HIGH (ref 134–144)

## 2017-11-01 ENCOUNTER — Telehealth: Payer: Self-pay | Admitting: *Deleted

## 2017-11-01 NOTE — Telephone Encounter (Signed)
Pt contacted pre-catheterization scheduled at [facility] for: Tuesday November 02, 2017 7:30 AM Verified arrival time and place: [facility] Main Entrance A at: 5:30 AM  No solid food after midnight prior to cath, clear liquids until 5 AM day of procedure. Verified allergies in Epic-shellfish allergy listed, verified pt has had and tolerated contrast in the past.  Hold: Eliquis-10/31/17 until post procedure. Furosemide-day before and day of procedure.  Except hold medications AM meds can be  taken pre-cath with sip of water including: ASA 81 mg  Confirmed patient has responsible person to drive home post procedure and for 24 hours after you arrive home: yes  I discussed instructions with patient's daughter, [name]), she verbalized understanding, thanked me for call.

## 2017-11-02 ENCOUNTER — Encounter (HOSPITAL_COMMUNITY)
Admission: RE | Disposition: A | Discharge status: Home / Self Care | Payer: Self-pay | Source: Ambulatory Visit | Attending: Cardiovascular Disease

## 2017-11-02 ENCOUNTER — Ambulatory Visit (HOSPITAL_COMMUNITY)
Admission: RE | Admit: 2017-11-02 | Discharge: 2017-11-02 | Disposition: A | Discharge status: Home / Self Care | Payer: Medicare Other | Source: Ambulatory Visit | Attending: Cardiovascular Disease | Admitting: Cardiovascular Disease

## 2017-11-02 ENCOUNTER — Encounter (HOSPITAL_COMMUNITY): Payer: Self-pay | Admitting: Cardiovascular Disease

## 2017-11-02 ENCOUNTER — Telehealth: Payer: Self-pay | Admitting: Cardiology

## 2017-11-02 DIAGNOSIS — Z882 Allergy status to sulfonamides status: Secondary | ICD-10-CM | POA: Diagnosis not present

## 2017-11-02 DIAGNOSIS — Z87891 Personal history of nicotine dependence: Secondary | ICD-10-CM | POA: Diagnosis not present

## 2017-11-02 DIAGNOSIS — M1990 Unspecified osteoarthritis, unspecified site: Secondary | ICD-10-CM | POA: Diagnosis not present

## 2017-11-02 DIAGNOSIS — Z7951 Long term (current) use of inhaled steroids: Secondary | ICD-10-CM | POA: Diagnosis not present

## 2017-11-02 DIAGNOSIS — J449 Chronic obstructive pulmonary disease, unspecified: Secondary | ICD-10-CM | POA: Diagnosis not present

## 2017-11-02 DIAGNOSIS — N400 Enlarged prostate without lower urinary tract symptoms: Secondary | ICD-10-CM | POA: Insufficient documentation

## 2017-11-02 DIAGNOSIS — Z79899 Other long term (current) drug therapy: Secondary | ICD-10-CM | POA: Diagnosis not present

## 2017-11-02 DIAGNOSIS — I2582 Chronic total occlusion of coronary artery: Secondary | ICD-10-CM | POA: Diagnosis not present

## 2017-11-02 DIAGNOSIS — I4819 Other persistent atrial fibrillation: Secondary | ICD-10-CM | POA: Insufficient documentation

## 2017-11-02 DIAGNOSIS — I10 Essential (primary) hypertension: Secondary | ICD-10-CM | POA: Insufficient documentation

## 2017-11-02 DIAGNOSIS — Z7901 Long term (current) use of anticoagulants: Secondary | ICD-10-CM | POA: Diagnosis not present

## 2017-11-02 DIAGNOSIS — Z823 Family history of stroke: Secondary | ICD-10-CM | POA: Diagnosis not present

## 2017-11-02 DIAGNOSIS — Z91013 Allergy to seafood: Secondary | ICD-10-CM | POA: Insufficient documentation

## 2017-11-02 DIAGNOSIS — E119 Type 2 diabetes mellitus without complications: Secondary | ICD-10-CM | POA: Diagnosis not present

## 2017-11-02 DIAGNOSIS — R9431 Abnormal electrocardiogram [ECG] [EKG]: Secondary | ICD-10-CM | POA: Insufficient documentation

## 2017-11-02 DIAGNOSIS — I25119 Atherosclerotic heart disease of native coronary artery with unspecified angina pectoris: Secondary | ICD-10-CM | POA: Insufficient documentation

## 2017-11-02 DIAGNOSIS — Z8249 Family history of ischemic heart disease and other diseases of the circulatory system: Secondary | ICD-10-CM | POA: Insufficient documentation

## 2017-11-02 DIAGNOSIS — K219 Gastro-esophageal reflux disease without esophagitis: Secondary | ICD-10-CM | POA: Insufficient documentation

## 2017-11-02 DIAGNOSIS — Z888 Allergy status to other drugs, medicaments and biological substances status: Secondary | ICD-10-CM | POA: Diagnosis not present

## 2017-11-02 HISTORY — PX: CORONARY ANGIOGRAPHY: CATH118303

## 2017-11-02 SURGERY — CORONARY ANGIOGRAPHY (CATH LAB)
Anesthesia: LOCAL

## 2017-11-02 MED ORDER — LIDOCAINE HCL (PF) 1 % IJ SOLN
INTRAMUSCULAR | Status: AC
  Filled 2017-11-02: qty 30

## 2017-11-02 MED ORDER — SODIUM CHLORIDE 0.9% FLUSH: 3.0000 mL | INTRAVENOUS | Status: DC | PRN

## 2017-11-02 MED ORDER — ACETAMINOPHEN 325 MG PO TABS: 650.0000 mg | ORAL_TABLET | ORAL | Status: DC | PRN

## 2017-11-02 MED ORDER — SODIUM CHLORIDE 0.9 % WEIGHT BASED INFUSION
3.0000 mL/kg/h | INTRAVENOUS | Status: AC
  Administered 2017-11-02: 3 mL/kg/h via INTRAVENOUS

## 2017-11-02 MED ORDER — HEPARIN SODIUM (PORCINE) 1000 UNIT/ML IJ SOLN
INTRAMUSCULAR | Status: DC | PRN
  Administered 2017-11-02: 4000 [IU] via INTRAVENOUS

## 2017-11-02 MED ORDER — HEPARIN (PORCINE) IN NACL 1000-0.9 UT/500ML-% IV SOLN
INTRAVENOUS | Status: DC | PRN
  Administered 2017-11-02 (×2): 500 mL

## 2017-11-02 MED ORDER — SODIUM CHLORIDE 0.9 % IV SOLN: 250.0000 mL | INTRAVENOUS | Status: DC | PRN

## 2017-11-02 MED ORDER — SODIUM CHLORIDE 0.9% FLUSH: 3.0000 mL | Freq: Two times a day (BID) | INTRAVENOUS | Status: DC

## 2017-11-02 MED ORDER — VERAPAMIL HCL 2.5 MG/ML IV SOLN
INTRAVENOUS | Status: DC | PRN
  Administered 2017-11-02: 10 mL via INTRA_ARTERIAL

## 2017-11-02 MED ORDER — VERAPAMIL HCL 2.5 MG/ML IV SOLN
INTRAVENOUS | Status: AC
  Filled 2017-11-02: qty 2

## 2017-11-02 MED ORDER — HEPARIN (PORCINE) IN NACL 1000-0.9 UT/500ML-% IV SOLN
INTRAVENOUS | Status: AC
  Filled 2017-11-02: qty 1000

## 2017-11-02 MED ORDER — IOHEXOL 350 MG/ML SOLN
INTRAVENOUS | Status: DC | PRN
  Administered 2017-11-02: 45 mL via INTRA_ARTERIAL

## 2017-11-02 MED ORDER — SODIUM CHLORIDE 0.9 % WEIGHT BASED INFUSION: 1.0000 mL/kg/h | INTRAVENOUS | Status: DC

## 2017-11-02 MED ORDER — MIDAZOLAM HCL 2 MG/2ML IJ SOLN
INTRAMUSCULAR | Status: AC
  Filled 2017-11-02: qty 2

## 2017-11-02 MED ORDER — ONDANSETRON HCL 4 MG/2ML IJ SOLN: 4.0000 mg | Freq: Four times a day (QID) | INTRAMUSCULAR | Status: DC | PRN

## 2017-11-02 MED ORDER — LIDOCAINE HCL (PF) 1 % IJ SOLN
INTRAMUSCULAR | Status: DC | PRN
  Administered 2017-11-02: 2 mL via INTRADERMAL

## 2017-11-02 MED ORDER — ASPIRIN 81 MG PO CHEW: 81.0000 mg | CHEWABLE_TABLET | Freq: Once | ORAL | Status: DC

## 2017-11-02 MED ORDER — MIDAZOLAM HCL 2 MG/2ML IJ SOLN
INTRAMUSCULAR | Status: DC | PRN
  Administered 2017-11-02: 1 mg via INTRAVENOUS

## 2017-11-02 SURGICAL SUPPLY — 10 items
CATH 5FR JL3.5 JR4 ANG PIG MP (CATHETERS) ×2 IMPLANT
DEVICE RAD COMP TR BAND LRG (VASCULAR PRODUCTS) ×2 IMPLANT
GLIDESHEATH SLEND SS 6F .021 (SHEATH) ×2 IMPLANT
GUIDEWIRE INQWIRE 1.5J.035X260 (WIRE) ×1 IMPLANT
INQWIRE 1.5J .035X260CM (WIRE) ×2
KIT HEART LEFT (KITS) ×2 IMPLANT
PACK CARDIAC CATHETERIZATION (CUSTOM PROCEDURE TRAY) ×2 IMPLANT
TRANSDUCER W/STOPCOCK (MISCELLANEOUS) ×2 IMPLANT
TUBING CIL FLEX 10 FLL-RA (TUBING) ×2 IMPLANT
WIRE HI TORQ VERSACORE-J 145CM (WIRE) ×2 IMPLANT

## 2017-11-02 NOTE — Discharge Instructions (Signed)

## 2017-11-02 NOTE — Telephone Encounter (Signed)
New Message:    Daughter said pt had Cath today. Please call if possible, she have questions about his medicine.

## 2017-11-02 NOTE — Interval H&P Note (Signed)
Cath Lab Visit (complete for each Cath Lab visit)  Clinical Evaluation Leading to the Procedure:   ACS: No.  Non-ACS:    Anginal Classification: CCS II  Anti-ischemic medical therapy: Minimal Therapy (1 class of medications)  Non-Invasive Test Results: Low-risk stress test findings: cardiac mortality <1%/year  Prior CABG: No previous CABG      History and Physical Interval Note:  11/02/2017 6:38 AM  Ronnie Torres  has presented today for surgery, with the diagnosis of cad  The various methods of treatment have been discussed with the patient and family. After consideration of risks, benefits and other options for treatment, the patient has consented to  Procedure(s): LEFT HEART CATH AND CORONARY ANGIOGRAPHY (N/A) as a surgical intervention .  The patient's history has been reviewed, patient examined, no change in status, stable for surgery.  I have reviewed the patient's chart and labs.  Questions were answered to the patient's satisfaction.     Tonny Bollman

## 2017-11-03 NOTE — Telephone Encounter (Signed)
Spoke w/ dtr.   Dtr wasn't at d/c yesterday, post cath, so she is not sure of 2 medications instructions - Lasix & Eliquis. Advised that he may resume his Lasix and Eliquis today, per d/c instructions. Lisa verbalizes understanding and appreciative of the call.

## 2017-11-16 ENCOUNTER — Ambulatory Visit (INDEPENDENT_AMBULATORY_CARE_PROVIDER_SITE_OTHER): Payer: Medicare Other | Admitting: Cardiology

## 2017-11-16 ENCOUNTER — Encounter: Payer: Self-pay | Admitting: Cardiology

## 2017-11-16 VITALS — BP 122/74 | HR 78 | Ht 64.0 in | Wt 193.0 lb

## 2017-11-16 DIAGNOSIS — I25118 Atherosclerotic heart disease of native coronary artery with other forms of angina pectoris: Secondary | ICD-10-CM

## 2017-11-16 DIAGNOSIS — R600 Localized edema: Secondary | ICD-10-CM

## 2017-11-16 DIAGNOSIS — I2510 Atherosclerotic heart disease of native coronary artery without angina pectoris: Secondary | ICD-10-CM | POA: Diagnosis not present

## 2017-11-16 DIAGNOSIS — I4819 Other persistent atrial fibrillation: Secondary | ICD-10-CM | POA: Diagnosis not present

## 2017-11-16 NOTE — Progress Notes (Signed)
Electrophysiology Office Note   Date:  11/16/2017   ID:  Ronnie Torres, DOB 09-Jun-1929, MRN [id number]  PCP:  Ronnie Like, MD  Cardiologist:   Primary Electrophysiologist:  Alece Koppel Jorja Loa, MD    No chief complaint on file.    History of Present Illness: Ronnie Torres is a 84 y.o. male who is being seen today for the evaluation of atrial fibrillation at the request of Ronnie Torres. Presenting today for electrophysiology evaluation.  He has a history of paroxysmal atrial fibrillation on Eliquis and amiodarone, GERD, COPD, BPH, and intermittent lower extremity edema.  His main complaint today is of weakness and fatigue.  He says that he went into atrial fibrillation a few months ago, which is when the symptoms started.  He is also been more short of breath than usual.  He has tried amiodarone, but did not stay in normal rhythm.  He is also been having some chest pain.  He has had multiple episodes in the past.  He had a Myoview which was unremarkable.  He continues to have substernal chest pain that radiates to his neck.  He had coronary angiography on 11/02/2017.  This showed a proximal to mid LAD lesion that was 100% stenosed.  It was thought at that time due to his minimal symptoms that a CTO intervention was not necessary.  Today, denies symptoms of palpitations, chest pain, shortness of breath, orthopnea, PND, lower extremity edema, claudication, dizziness, presyncope, syncope, bleeding, or neurologic sequela. The patient is tolerating medications without difficulties.  Overall he is doing well.  He remains in atrial fibrillation.  He continues to have intermittent episodes of chest pain that occur mainly when he is lying down.  He does have a hiatal hernia and his daughter feels that it may be due to that.  Otherwise he does have issues of weakness and fatigue that he attributes to atrial fibrillation.  Past Medical History:  Diagnosis Date  . Acid reflux   . Arthritis   . COPD  (chronic obstructive pulmonary disease) (HCC)   . Emphysema lung (HCC)   . Hiatal hernia    Past Surgical History:  Procedure Laterality Date  . CORONARY ANGIOGRAPHY N/A 11/02/2017   Procedure: CORONARY ANGIOGRAPHY (CATH LAB);  Surgeon: Ronnie Bollman, MD;  Location: [facility] INVASIVE CV LAB;  Service: Cardiovascular;  Laterality: N/A;     Current Outpatient Medications  Medication Sig Dispense Refill  . albuterol (PROVENTIL HFA;VENTOLIN HFA) 108 (90 Base) MCG/ACT inhaler Inhale 2 puffs into the lungs every 4 (four) hours as needed for wheezing or shortness of breath.     Marland Kitchen apixaban (ELIQUIS) 5 MG TABS tablet Take 5 mg by mouth 2 (two) times daily.     . budesonide-formoterol (SYMBICORT) 160-4.5 MCG/ACT inhaler Inhale 2 puffs into the lungs 2 (two) times daily.    . ferrous sulfate 325 (65 FE) MG tablet Take 325 mg by mouth daily.     . furosemide (LASIX) 40 MG tablet Take 40 mg by mouth daily.   1  . gabapentin (NEURONTIN) 100 MG capsule Take 100 mg by mouth 2 (two) times daily.     . meclizine (ANTIVERT) 12.5 MG tablet Take 12.5 mg by mouth 3 (three) times daily as needed for dizziness.    . metoprolol tartrate (LOPRESSOR) 25 MG tablet Take 12.5 mg by mouth 2 (two) times daily.     . midodrine (PROAMATINE) 2.5 MG tablet Take 2.5 mg by mouth 2 (two) times daily.     Marland Kitchen  omeprazole (PRILOSEC) 20 MG capsule Take 20 mg by mouth 2 (two) times daily.     . sertraline (ZOLOFT) 25 MG tablet Take 25 mg by mouth daily.    . tamsulosin (FLOMAX) 0.4 MG CAPS capsule Take 0.4 mg by mouth 2 (two) times daily.      No current facility-administered medications for this visit.     Allergies:   Shellfish allergy; Tape; Sulfa antibiotics; and Trazodone and nefazodone   Social History:  The patient  reports that he quit smoking about 44 years ago. He has never used smokeless tobacco. He reports that he does not drink alcohol or use drugs.   Family History:  The patient's family history includes Atrial  fibrillation in his brother; CVA in his father; Cancer in his brother; Dementia in his sister and sister; Other in his mother; Pancreatic cancer in his brother.   ROS:  Please see the history of present illness.   Otherwise, review of systems is positive for leg swelling.   All other systems are reviewed and negative.   PHYSICAL EXAM: VS:  BP 122/74   Pulse 78   Ht 5\' 4"  (1.626 m)   Wt 193 lb (87.5 kg)   BMI 33.13 kg/m  , BMI Body mass index is 33.13 kg/m. GEN: Well nourished, well developed, in no acute distress  HEENT: normal  Neck: no JVD, carotid bruits, or masses Cardiac: iRRR; no murmurs, rubs, or gallops, 2+ edema  Respiratory:  clear to auscultation bilaterally, normal work of breathing GI: soft, nontender, nondistended, + BS MS: no deformity or atrophy  Skin: warm and dry Neuro:  Strength and sensation are intact Psych: euthymic mood, full affect  EKG:  EKG is ordered today. Personal review of the ekg ordered shows atrial fibrillation, rate 78, QTc 401 ms  Recent Labs: 10/25/2017: BUN 20; Creatinine, Ser 1.22; Hemoglobin 13.8; Platelets 128; Potassium 4.2; Sodium 145    Lipid Panel  No results found for: CHOL, TRIG, HDL, CHOLHDL, VLDL, LDLCALC, LDLDIRECT   Wt Readings from Last 3 Encounters:  11/16/17 193 lb (87.5 kg)  11/02/17 190 lb (86.2 kg)  10/25/17 190 lb (86.2 kg)      Other studies Reviewed: Additional studies/ records that were reviewed today include: Myoview 06/29/2017 Review of the above records today demonstrates:  No inducible ischemia No wall motion abnormalities Normal left ventricular function  TTE 10/01/17 - Left ventricle: The cavity size was normal. There was mild   concentric hypertrophy. Systolic function was mildly reduced. The   estimated ejection fraction was in the range of 45% to 50%.   Diffuse hypokinesis. - Aortic valve: Transvalvular velocity was within the normal range.   There was no stenosis. There was no regurgitation. -  Mitral valve: Moderately calcified annulus. Calcification.   Transvalvular velocity was within the normal range. There was no   evidence for stenosis. There was trivial regurgitation. - Left atrium: The atrium was moderately dilated. - Right ventricle: The cavity size was normal. Wall thickness was   normal. Systolic function was normal. - Tricuspid valve: There was mild regurgitation. - Pulmonary arteries: Systolic pressure was within the normal   range. PA peak pressure: 18 mm Hg (S).  LHC 11/02/17  Ost 2nd Mrg to 2nd Mrg lesion is 30% stenosed.  Prox LAD to Mid LAD lesion is 100% stenosed.   1.  Total occlusion of the LAD just after the first diagonal branch 2.  Minor nonobstructive disease involving the left main, left circumflex, and  RCA   ASSESSMENT AND PLAN:  1.  Persistent atrial fibrillation: Echo with a moderately dilated left atrium.  He is currently on Eliquis.  He would likely feel better in sinus rhythm.  We Calum Cormier thus plan for admission for dofetilide.  This patients CHA2DS2-VASc Score and unadjusted Ischemic Stroke Rate (% per year) is equal to 3.2 % stroke rate/year from a score of 3  Above score calculated as 1 point each if present [CHF, HTN, DM, Vascular=MI/PAD/Aortic Plaque, Age if 65-74, or Male] Above score calculated as 2 points each if present [Age > 75, or Stroke/TIA/TE]   2.  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  He does have intermittent chest pain, though it could be due to his hiatal hernia and reflux.  Due to his low blood pressures, it does not appear that he Davis Ambrosini tolerate any nitrates.  We Datra Clary continue with current management.  3.  Lower extremity edema: His legs are quite tight today.  I Kariann Wecker increase his Lasix to twice daily for the next 3 days.  Current medicines are reviewed at length with the patient today.   The patient does not have concerns regarding his  medicines.  The following changes were made today: None  Labs/ tests ordered today include:  Orders Placed This Encounter  Procedures  . EKG 12-Lead     Disposition:   FU with Earlee Herald 3 months  Signed, Sharai Overbay Jorja Loa, MD  11/16/2017 4:49 PM     [facility] [address] [place] [address] [phone] (office) [phone] (fax)

## 2017-11-16 NOTE — Patient Instructions (Addendum)
Medication Instructions:  Your physician has recommended you make the following change in your medication:  1.  INCREASE Lasix to 40 mg twice daily for the next 3 days, then return to normal dosing of 40 mg once a day  If you need a refill on your cardiac medications before your next appointment, please call your pharmacy.   Lab work: None ordered  Testing/Procedures: None ordered   Follow-Up: Your physician recommends that you schedule a follow-up appointment in: 1-2 weeks with Rudi Coco, NP in the AFib clinic.  Their office will contact you to arrange this appointment  Any Other Special Instructions Will Be Listed Below (If Applicable). Dofetilide capsules What is this medicine? DOFETILIDE (doe FET il ide) is an antiarrhythmic drug. It helps make your heart beat regularly. This medicine also helps to slow rapid heartbeats. This medicine may be used for other purposes; ask your health care provider or pharmacist if you have questions. COMMON BRAND NAME(S): Tikosyn What should I tell my health care provider before I take this medicine? They need to know if you have any of these conditions: -heart disease -history of low levels of potassium or magnesium -kidney disease -liver disease -an unusual or allergic reaction to dofetilide, other medicines, foods, dyes, or preservatives -pregnant or trying to get pregnant -breast-feeding How should I use this medicine? Take this medicine by mouth with a glass of water. Follow the directions on the prescription label. You can take this medicine with or without food. Do not drink grapefruit juice with this medicine. Take your doses at regular intervals. Do not take your medicine more often than directed. Do not stop taking this medicine suddenly. This may cause serious, heart-related side effects. Your doctor will tell you how much medicine to take. If your doctor wants you to stop the medicine, the dose will be slowly lowered over time to  avoid any side effects. Talk to your pediatrician regarding the use of this medicine in children. Special care may be needed. Overdosage: If you think you have taken too much of this medicine contact a poison control center or emergency room at once. NOTE: This medicine is only for you. Do not share this medicine with others. What if I miss a dose? If you miss a dose, take it as soon as you can. If it is almost time for your next dose, take only that dose. Do not take double or extra doses. What may interact with this medicine? Do not take this medicine with any of the following medications: -cimetidine -dolutegravir -hydrochlorothiazide alone or in combination with other medicines -isavuconazonium -ketoconazole -megestrol -other medicines that prolong the QT interval (cause an abnormal heart rhythm) -prochlorperazine -trimethoprim alone or in combination with sulfamethoxazole -verapamil This medicine may also interact with the following medications: -amiloride -certain antidepressants like fluvoxamine or paroxetine -certain antiviral medicines for HIV or AIDS like atazanavir or darunavir -certain medicines for fungal infections like clotrimazole or miconazole -digoxin -diltiazem -dronabinol, THC -grapefruit juice -metformin -nefazodone -triamterene -zafirlukast This list may not describe all possible interactions. Give your health care provider a list of all the medicines, herbs, non-prescription drugs, or dietary supplements you use. Also tell them if you smoke, drink alcohol, or use illegal drugs. Some items may interact with your medicine. What should I watch for while using this medicine? Visit your doctor or health care professional for regular checks on your progress. Wear a medical ID bracelet or chain, and carry a card that describes your disease and details of  your medicine and dosage times. Check your heart rate and blood pressure regularly while you are taking this  medicine. Ask your doctor or health care professional what your heart rate and blood pressure should be, and when you should contact him or her. Your doctor or health care professional also may schedule regular tests to check your progress. You will be started on this medicine in a specialized facility for at least three days. You will be monitored to find the right dose of medicine for you. It is very important that you take your medicine exactly as prescribed when you leave the hospital. The correct dosing of this medicine is very important to treat your condition and prevent possible serious side effects. What side effects may I notice from receiving this medicine? Side effects that you should report to your doctor or health care professional as soon as possible: -allergic reactions like skin rash, itching or hives, swelling of the face, lips, or tongue -breathing problems -dizziness -fast or rapid beating of the heart -feeling faint or lightheaded -swelling of the ankles -unusually weak or tired -vomiting Side effects that usually do not require medical attention (report to your doctor or health care professional if they continue or are bothersome): -cough -diarrhea -difficulty sleeping -headache -nausea -stomach pain This list may not describe all possible side effects. Call your doctor for medical advice about side effects. You may report side effects to FDA at 1-800-FDA-1088. Where should I keep my medicine? Keep out of the reach of children. Store at room temperature between 15 and 30 degrees C (59 and 86 degrees F). Protect the medicine from moisture or humidity. Keep container tightly closed. Throw away any unused medicine after the expiration date. NOTE: This sheet is a summary. It may not cover all possible information. If you have questions about this medicine, talk to your doctor, pharmacist, or health care provider.  2018 Elsevier/Gold Standard (2015-11-25 81:19:14)

## 2017-11-18 ENCOUNTER — Telehealth: Payer: Self-pay | Admitting: Pharmacist

## 2017-11-18 NOTE — Telephone Encounter (Signed)
Medication list reviewed in anticipation of upcoming Tikosyn initiation. Patient is not taking any contraindicated medications, however he does take sertraline which is QTc prolonging. Will need to monitor QTc closely during Tikosyn initiation.  Patient is anticoagulated on Eliquis 5mg  BID on the appropriate dose. Please ensure that patient has not missed any anticoagulation doses in the 3 weeks prior to Tikosyn initiation.   Patient will need to be counseled to avoid use of Benadryl while on Tikosyn and in the 2-3 days prior to Tikosyn initiation.

## 2017-11-29 ENCOUNTER — Encounter (HOSPITAL_COMMUNITY): Payer: Self-pay | Admitting: Nurse Practitioner

## 2017-11-29 ENCOUNTER — Other Ambulatory Visit: Payer: Self-pay

## 2017-11-29 ENCOUNTER — Inpatient Hospital Stay (HOSPITAL_COMMUNITY)
Admission: AD | Admit: 2017-11-29 | Discharge: 2017-12-02 | DRG: 310 | Disposition: A | Discharge status: Home / Self Care | Payer: Medicare Other | Attending: Cardiology | Admitting: Cardiology

## 2017-11-29 ENCOUNTER — Ambulatory Visit (HOSPITAL_COMMUNITY)
Admission: RE | Admit: 2017-11-29 | Discharge: 2017-11-29 | Disposition: A | Discharge status: Home / Self Care | Payer: Medicare Other | Source: Ambulatory Visit | Attending: Nurse Practitioner | Admitting: Nurse Practitioner

## 2017-11-29 VITALS — BP 116/68 | HR 77 | Ht 64.0 in | Wt 195.6 lb

## 2017-11-29 DIAGNOSIS — Z882 Allergy status to sulfonamides status: Secondary | ICD-10-CM

## 2017-11-29 DIAGNOSIS — Z87891 Personal history of nicotine dependence: Secondary | ICD-10-CM

## 2017-11-29 DIAGNOSIS — Z91013 Allergy to seafood: Secondary | ICD-10-CM | POA: Diagnosis not present

## 2017-11-29 DIAGNOSIS — J439 Emphysema, unspecified: Secondary | ICD-10-CM | POA: Diagnosis not present

## 2017-11-29 DIAGNOSIS — K219 Gastro-esophageal reflux disease without esophagitis: Secondary | ICD-10-CM | POA: Diagnosis not present

## 2017-11-29 DIAGNOSIS — K449 Diaphragmatic hernia without obstruction or gangrene: Secondary | ICD-10-CM | POA: Diagnosis present

## 2017-11-29 DIAGNOSIS — I25118 Atherosclerotic heart disease of native coronary artery with other forms of angina pectoris: Secondary | ICD-10-CM | POA: Diagnosis not present

## 2017-11-29 DIAGNOSIS — M1990 Unspecified osteoarthritis, unspecified site: Secondary | ICD-10-CM | POA: Diagnosis present

## 2017-11-29 DIAGNOSIS — I4819 Other persistent atrial fibrillation: Secondary | ICD-10-CM | POA: Diagnosis not present

## 2017-11-29 DIAGNOSIS — I2582 Chronic total occlusion of coronary artery: Secondary | ICD-10-CM | POA: Diagnosis not present

## 2017-11-29 DIAGNOSIS — Z823 Family history of stroke: Secondary | ICD-10-CM | POA: Diagnosis not present

## 2017-11-29 DIAGNOSIS — Z888 Allergy status to other drugs, medicaments and biological substances status: Secondary | ICD-10-CM | POA: Diagnosis not present

## 2017-11-29 DIAGNOSIS — Z7901 Long term (current) use of anticoagulants: Secondary | ICD-10-CM | POA: Diagnosis not present

## 2017-11-29 DIAGNOSIS — Z79899 Other long term (current) drug therapy: Secondary | ICD-10-CM

## 2017-11-29 DIAGNOSIS — R600 Localized edema: Secondary | ICD-10-CM | POA: Diagnosis not present

## 2017-11-29 DIAGNOSIS — Z800 Family history of malignant neoplasm of digestive organs: Secondary | ICD-10-CM

## 2017-11-29 HISTORY — DX: Sleep apnea, unspecified: G47.30

## 2017-11-29 LAB — BASIC METABOLIC PANEL
Anion gap: 3 — ABNORMAL LOW (ref 5–15)
BUN: 14 mg/dL (ref 8–23)
CO2: 25 mmol/L (ref 22–32)
CREATININE: 1.19 mg/dL (ref 0.61–1.24)
Calcium: 9.2 mg/dL (ref 8.9–10.3)
Chloride: 112 mmol/L — ABNORMAL HIGH (ref 98–111)
GFR calc Af Amer: >60 mL/min (ref >60)
GFR, EST NON AFRICAN AMERICAN: 53 mL/min — AB (ref >60)
GLUCOSE: 94 mg/dL (ref 70–99)
POTASSIUM: 3.9 mmol/L (ref 3.5–5.1)
SODIUM: 140 mmol/L (ref 135–145)

## 2017-11-29 LAB — MAGNESIUM: MAGNESIUM: 2 mg/dL (ref 1.7–2.4)

## 2017-11-29 MED ORDER — MOMETASONE FURO-FORMOTEROL FUM 200-5 MCG/ACT IN AERO
2.0000 | INHALATION_SPRAY | Freq: Two times a day (BID) | RESPIRATORY_TRACT | Status: DC
  Administered 2017-11-29 – 2017-12-02 (×6): 2 via RESPIRATORY_TRACT
  Filled 2017-11-29: qty 8.8

## 2017-11-29 MED ORDER — APIXABAN 5 MG PO TABS
5.0000 mg | ORAL_TABLET | Freq: Two times a day (BID) | ORAL | Status: DC
  Administered 2017-11-29 – 2017-12-02 (×6): 5 mg via ORAL
  Filled 2017-11-29 (×6): qty 1

## 2017-11-29 MED ORDER — GABAPENTIN 100 MG PO CAPS
100.0000 mg | ORAL_CAPSULE | Freq: Two times a day (BID) | ORAL | Status: DC
  Administered 2017-11-29 – 2017-12-02 (×6): 100 mg via ORAL
  Filled 2017-11-29 (×6): qty 1

## 2017-11-29 MED ORDER — SERTRALINE HCL 50 MG PO TABS
25.0000 mg | ORAL_TABLET | Freq: Every day | ORAL | Status: DC
  Administered 2017-11-30 – 2017-12-02 (×3): 25 mg via ORAL
  Filled 2017-11-29 (×3): qty 1

## 2017-11-29 MED ORDER — DOFETILIDE 250 MCG PO CAPS
250.0000 ug | ORAL_CAPSULE | Freq: Two times a day (BID) | ORAL | Status: DC
  Administered 2017-11-29 – 2017-12-02 (×6): 250 ug via ORAL
  Filled 2017-11-29 (×6): qty 1

## 2017-11-29 MED ORDER — TAMSULOSIN HCL 0.4 MG PO CAPS
0.4000 mg | ORAL_CAPSULE | Freq: Two times a day (BID) | ORAL | Status: DC
  Administered 2017-11-29 – 2017-12-02 (×6): 0.4 mg via ORAL
  Filled 2017-11-29 (×6): qty 1

## 2017-11-29 MED ORDER — SODIUM CHLORIDE 0.9% FLUSH
3.0000 mL | Freq: Two times a day (BID) | INTRAVENOUS | Status: DC
  Administered 2017-11-29 – 2017-12-01 (×6): 3 mL via INTRAVENOUS

## 2017-11-29 MED ORDER — MECLIZINE HCL 25 MG PO TABS: 12.5000 mg | ORAL_TABLET | Freq: Three times a day (TID) | ORAL | Status: DC | PRN

## 2017-11-29 MED ORDER — POTASSIUM CHLORIDE CRYS ER 20 MEQ PO TBCR
20.0000 meq | EXTENDED_RELEASE_TABLET | Freq: Once | ORAL | Status: AC
  Administered 2017-11-29: 20 meq via ORAL
  Filled 2017-11-29: qty 1

## 2017-11-29 MED ORDER — METOPROLOL TARTRATE 12.5 MG HALF TABLET
12.5000 mg | ORAL_TABLET | Freq: Two times a day (BID) | ORAL | Status: DC
  Administered 2017-11-29 – 2017-12-02 (×6): 12.5 mg via ORAL
  Filled 2017-11-29 (×6): qty 1

## 2017-11-29 MED ORDER — ALBUTEROL SULFATE (2.5 MG/3ML) 0.083% IN NEBU: 3.0000 mL | INHALATION_SOLUTION | RESPIRATORY_TRACT | Status: DC | PRN

## 2017-11-29 MED ORDER — MIDODRINE HCL 5 MG PO TABS
2.5000 mg | ORAL_TABLET | Freq: Two times a day (BID) | ORAL | Status: DC
  Administered 2017-11-29 – 2017-12-02 (×6): 2.5 mg via ORAL
  Filled 2017-11-29 (×6): qty 1

## 2017-11-29 MED ORDER — PANTOPRAZOLE SODIUM 40 MG PO TBEC
40.0000 mg | DELAYED_RELEASE_TABLET | Freq: Every day | ORAL | Status: DC
  Administered 2017-11-30 – 2017-12-02 (×3): 40 mg via ORAL
  Filled 2017-11-29 (×3): qty 1

## 2017-11-29 MED ORDER — SODIUM CHLORIDE 0.9 % IV SOLN
250.0000 mL | INTRAVENOUS | Status: DC | PRN
  Administered 2017-12-01: 13:00:00 via INTRAVENOUS

## 2017-11-29 MED ORDER — FUROSEMIDE 40 MG PO TABS
40.0000 mg | ORAL_TABLET | Freq: Every day | ORAL | Status: DC
  Administered 2017-11-30 – 2017-12-02 (×3): 40 mg via ORAL
  Filled 2017-11-29 (×3): qty 1

## 2017-11-29 MED ORDER — SODIUM CHLORIDE 0.9% FLUSH: 3.0000 mL | INTRAVENOUS | Status: DC | PRN

## 2017-11-29 NOTE — Care Management (Signed)
#   7.   S/[facility] @  [facility] # [id number]  1. TIKOSYN   125 MCG   25 MCG  500 MCG BID COVER-  NONE FORMULARY PRIOR APPROVAL- YES # [id number]  2. DOFETILIDE   125 MCG   250 MCG  500 MCG BID COVER- YES [employer]- $ 17.68 FOR EACH PRESCRIPTION   TIER- 4 DRUG PRIOR APPROVAL- NO  PREFERRED PHARMACY : YES [facility] $ 17.68  AND 90 DAY SUPPLY RETAIL $ 52.81 [facility] - $ 61.53 90 DAY SUPPLY FOR  M/O $ 159.11

## 2017-11-29 NOTE — Progress Notes (Signed)
Primary Care Physician: Jim Like, MD Referring Physician:Dr. Molinda Bailiff Ronnie Torres is a 84 y.o. male with a h/o CAD, LHC, 10/8, showing complete occlusion of the LAD, COPD/emphysema and persitent afib, that is in the afib clinic for tikosyn admit. He states no interruption in anticoagualtion for at least 3 weeks. He did however, took tylenol PM for sleep last night. I checked with Margaretmary Dys, PharmD, as usually we do not like benadryl within 48-72 hours of admission. She felt it would be acceptable to continue  for plans for admission with qtc today at 402 ms. He is also on low dose Zoloft that can also be qtc prolonging but again baseline qtc is well in the range to start Tikosyn. He took amiodarone in the past, but this was discontinued  the end of June 2019.  Today, he denies symptoms of palpitations, chest pain, shortness of breath, orthopnea, PND, lower extremity edema, dizziness, presyncope, syncope, or neurologic sequela. The patient is tolerating medications without difficulties and is otherwise without complaint today.   Past Medical History:  Diagnosis Date  . Acid reflux   . Arthritis   . COPD (chronic obstructive pulmonary disease) (HCC)   . Emphysema lung (HCC)   . Hiatal hernia    Past Surgical History:  Procedure Laterality Date  . CORONARY ANGIOGRAPHY N/A 11/02/2017   Procedure: CORONARY ANGIOGRAPHY (CATH LAB);  Surgeon: Tonny Bollman, MD;  Location: [facility] INVASIVE CV LAB;  Service: Cardiovascular;  Laterality: N/A;    Current Outpatient Medications  Medication Sig Dispense Refill  . albuterol (PROVENTIL HFA;VENTOLIN HFA) 108 (90 Base) MCG/ACT inhaler Inhale 2 puffs into the lungs every 4 (four) hours as needed for wheezing or shortness of breath.     Marland Kitchen apixaban (ELIQUIS) 5 MG TABS tablet Take 5 mg by mouth 2 (two) times daily.     . budesonide-formoterol (SYMBICORT) 160-4.5 MCG/ACT inhaler Inhale 2 puffs into the lungs 2 (two) times daily.    . furosemide  (LASIX) 40 MG tablet Take 40 mg by mouth daily.   1  . gabapentin (NEURONTIN) 100 MG capsule Take 100 mg by mouth 2 (two) times daily.     . meclizine (ANTIVERT) 12.5 MG tablet Take 12.5 mg by mouth 3 (three) times daily as needed for dizziness.    . metoprolol tartrate (LOPRESSOR) 25 MG tablet Take 12.5 mg by mouth 2 (two) times daily.     . midodrine (PROAMATINE) 2.5 MG tablet Take 2.5 mg by mouth 2 (two) times daily.     Marland Kitchen omeprazole (PRILOSEC) 20 MG capsule Take 20 mg by mouth 2 (two) times daily.     . sertraline (ZOLOFT) 25 MG tablet Take 25 mg by mouth daily.    . tamsulosin (FLOMAX) 0.4 MG CAPS capsule Take 0.4 mg by mouth 2 (two) times daily.      No current facility-administered medications for this encounter.     Allergies  Allergen Reactions  . Shellfish Allergy Hives  . Tape Other (See Comments)    "takes skin off"  . Sulfa Antibiotics Rash  . Trazodone And Nefazodone Rash    Social History   Socioeconomic History  . Marital status: Married    Spouse name: Not on file  . Number of children: Not on file  . Years of education: Not on file  . Highest education level: Not on file  Occupational History  . Not on file  Social Needs  . Financial resource strain: Not on file  .  Food insecurity:    Worry: Not on file    Inability: Not on file  . Transportation needs:    Medical: Not on file    Non-medical: Not on file  Tobacco Use  . Smoking status: Former Smoker    Last attempt to quit: 1975    Years since quitting: 44.8  . Smokeless tobacco: Never Used  Substance and Sexual Activity  . Alcohol use: Never    Frequency: Never  . Drug use: Never  . Sexual activity: Not on file  Lifestyle  . Physical activity:    Days per week: Not on file    Minutes per session: Not on file  . Stress: Not on file  Relationships  . Social connections:    Talks on phone: Not on file    Gets together: Not on file    Attends religious service: Not on file    Active member of  club or organization: Not on file    Attends meetings of clubs or organizations: Not on file    Relationship status: Not on file  . Intimate partner violence:    Fear of current or ex partner: Not on file    Emotionally abused: Not on file    Physically abused: Not on file    Forced sexual activity: Not on file  Other Topics Concern  . Not on file  Social History Narrative  . Not on file    Family History  Problem Relation Age of Onset  . Other Mother        died in childbirth  . CVA Father   . Dementia Sister   . Pancreatic cancer Brother   . Cancer Brother   . Atrial fibrillation Brother   . Dementia Sister     ROS- All systems are reviewed and negative except as per the HPI above  Physical Exam: Vitals:   11/29/17 1019  BP: 116/68  Pulse: 77  Weight: 88.7 kg  Height: 5\' 4"  (1.626 m)   Wt Readings from Last 3 Encounters:  11/29/17 88.7 kg  11/16/17 87.5 kg  11/02/17 86.2 kg    Labs: Lab Results  Component Value Date   NA 145 (H) 10/25/2017   K 4.2 10/25/2017   CL 106 10/25/2017   CO2 26 10/25/2017   GLUCOSE 82 10/25/2017   BUN 20 10/25/2017   CREATININE 1.22 10/25/2017   CALCIUM 9.8 10/25/2017   No results found for: INR No results found for: CHOL, HDL, LDLCALC, TRIG   GEN- The patient is well appearing, alert and oriented x 3 today.   Head- normocephalic, atraumatic Eyes-  Sclera clear, conjunctiva pink Ears- hearing intact Oropharynx- clear Neck- supple, no JVP Lymph- no cervical lymphadenopathy Lungs- Clear to ausculation bilaterally, normal work of breathing Heart- irregular rate and rhythm, no murmurs, rubs or gallops, PMI not laterally displaced GI- soft, NT, ND, + BS Extremities- no clubbing, cyanosis, or edema MS- no significant deformity or atrophy Skin- no rash or lesion Psych- euthymic mood, full affect Neuro- strength and sensation are intact  EKG- afib at 77 bpm, qrs int at 78 bpm, qtc 402 ms Epic records reviewed Echo-Study  Conclusions  - Left ventricle: The cavity size was normal. There was mild   concentric hypertrophy. Systolic function was mildly reduced. The   estimated ejection fraction was in the range of 45% to 50%.   Diffuse hypokinesis. - Aortic valve: Transvalvular velocity was within the normal range.   There was no stenosis. There  was no regurgitation. - Mitral valve: Moderately calcified annulus. Calcification.   Transvalvular velocity was within the normal range. There was no   evidence for stenosis. There was trivial regurgitation. - Left atrium: The atrium was moderately dilated. - Right ventricle: The cavity size was normal. Wall thickness was   normal. Systolic function was normal. - Tricuspid valve: There was mild regurgitation. - Pulmonary arteries: Systolic pressure was within the normal   range. PA peak pressure: 18 mm Hg (S).  LHC-10/8-Ost 2nd Mrg to 2nd Mrg lesion is 30% stenosed.  Prox LAD to Mid LAD lesion is 100% stenosed.   1.  Total occlusion of the LAD just after the first diagonal branch 2.  Minor nonobstructive disease involving the left main, left circumflex, and RCA  Recommendation: Consider CTO-PCI of the LAD.  There is a well-formed epicardial collateral from the obtuse marginal branch to the LAD and the length of the total occlusion appears to be fairly short.  Will refer to Dr Eldridge Dace or [place].  Resume apixaban tomorrow.    Assessment and Plan: 1. Persistent  afib Pending admission today for Tikosyn load General precautions for Tikosyn discussed Did take tylenol pm last night and is on low dose Sertraline both can be qtc prolonging, but qtc is very acceptable today at 402 ms Has not missed any anticoagulation for at least 3 weeks Has been on amiodarone in the past but appears to be off drug x 4 months Can afford drug  Bmet/mag shows acceptable levels of K+ at 3.9 and mag at 2.0 to start tikosyn but SCr cal at 55.75 indicating starting dose of 250 mcg of  tikosyn  To 6E 13  Lupita Leash C. Matthew Folks [facility] [address] [place], [place] [address] [phone]

## 2017-11-29 NOTE — Progress Notes (Signed)
Late Entry: Nursing notified by CCMD that patient had VT alarm at 2019. Pt sitting in chair, pt denies any c/o.  RRT on department rounding and viewed strip with artifact finding.  Cards MD on call paged to review strip.  Will cont to monitor pt closely.

## 2017-11-29 NOTE — Progress Notes (Signed)
Pharmacy Review for Dofetilide (Tikosyn) Initiation  Admit Complaint: 84 y.o. male admitted 11/29/2017 with atrial fibrillation to be initiated on dofetilide.   Assessment:  Patient Exclusion Criteria: If any screening criteria checked as "Yes", then  patient  should NOT receive dofetilide until criteria item is corrected. If "Yes" please indicate correction plan.  YES  NO Patient  Exclusion Criteria Correction Plan  []  [x]  Baseline QTc interval is greater than or equal to 440 msec. IF above YES box checked dofetilide contraindicated unless patient has ICD; then may proceed if QTc 500-550 msec or with known ventricular conduction abnormalities may proceed with QTc 550-600 msec. QTc =  402   []  [x]  Magnesium level is less than 1.8 mEq/l : Last magnesium:  Lab Results  Component Value Date   MG 2.0 11/29/2017         [x]  []  Potassium level is less than 4 mEq/l : Last potassium:  Lab Results  Component Value Date   K 3.9 11/29/2017       Giving Kdur  X 1  []  [x]  Patient is known or suspected to have a digoxin level greater than 2 ng/ml: No results found for: DIGOXIN    []  [x]  Creatinine clearance less than 20 ml/min (calculated using Cockcroft-Gault, actual body weight and serum creatinine): Estimated Creatinine Clearance: 44.6 mL/min (by C-G formula based on SCr of 1.19 mg/dL).    [x]  []  Patient has received drugs known to prolong the QT intervals within the last 48 hours (phenothiazines, tricyclics or tetracyclic antidepressants, erythromycin, H-1 antihistamines, cisapride, fluoroquinolones, azithromycin). Drugs not listed above may have an, as yet, undetected potential to prolong the QT interval, updated information on QT prolonging agents is available at this website:QT prolonging agents Took Tylenol PM x 1 dose 11/3, on low dose sertraline > these have been ok'd per EP and outpatient Rx with close QTc monitoring  []  [x]  Patient received a dose of hydrochlorothiazide (Oretic)  alone or in any combination including triamterene (Dyazide, Maxzide) in the last 48 hours.   []  [x]  Patient received a medication known to increase dofetilide plasma concentrations prior to initial dofetilide dose:  . Trimethoprim (Primsol, Proloprim) in the last 36 hours . Verapamil (Calan, Verelan) in the last 36 hours or a sustained release dose in the last 72 hours . Megestrol (Megace) in the last 5 days  . Cimetidine (Tagamet) in the last 6 hours . Ketoconazole (Nizoral) in the last 24 hours . Itraconazole (Sporanox) in the last 48 hours  . Prochlorperazine (Compazine) in the last 36 hours    []  [x]  Patient is known to have a history of torsades de pointes; congenital or acquired long QT syndromes.   []  [x]  Patient has received a Class 1 antiarrhythmic with less than 2 half-lives since last dose. (Disopyramide, Quinidine, Procainamide, Lidocaine, Mexiletine, Flecainide, Propafenone)   []  [x]  Patient has received amiodarone therapy in the past 3 months or amiodarone level is greater than 0.3 ng/ml. Amio d/c'd in June 2019   Patient has been appropriately anticoagulated with apixaban.  Ordering provider was confirmed at [email or web address] if they are not listed on the [facility] Authorized Prescribers list.  Goal of Therapy: Follow renal function, electrolytes, potential drug interactions, and dose adjustment. Provide education and 1 week supply at discharge.  Plan:  [x]   Physician selected initial dose within range recommended for patients level of renal function - will monitor for response.  []   Physician selected initial dose outside of range recommended for patients  level of renal function - will discuss if the dose should be altered at this time.   Select One Calculated CrCl  Dose q12h  []  > 60 ml/min 500 mcg  [x]  40-60 ml/min 250 mcg  []  20-40 ml/min 125 mcg   2. Follow up QTc after the first 5 doses, renal function, electrolytes (K & Mg) daily x 3     days, dose adjustment,  success of initiation and facilitate 1 week discharge supply as     clinically indicated.  3. Initiate Tikosyn education video (Call [phone] and ask for Tikosyn Video # 116).  4. Place Enrollment Form on the chart for discharge supply of dofetilide.   Babs Bertin P 12:59 PM 11/29/2017

## 2017-11-29 NOTE — Progress Notes (Signed)
Patient has home CPAP at bedside set up. Patient states he does not need assistance from RT. Cords are in good condition.

## 2017-11-30 LAB — BASIC METABOLIC PANEL
ANION GAP: 5 (ref 5–15)
BUN: 15 mg/dL (ref 8–23)
CALCIUM: 9.1 mg/dL (ref 8.9–10.3)
CO2: 27 mmol/L (ref 22–32)
Chloride: 109 mmol/L (ref 98–111)
Creatinine, Ser: 1.24 mg/dL (ref 0.61–1.24)
GFR calc non Af Amer: 51 mL/min — ABNORMAL LOW (ref >60)
GFR, EST AFRICAN AMERICAN: 59 mL/min — AB (ref >60)
Glucose, Bld: 89 mg/dL (ref 70–99)
Potassium: 4.4 mmol/L (ref 3.5–5.1)
SODIUM: 141 mmol/L (ref 135–145)

## 2017-11-30 LAB — MAGNESIUM: Magnesium: 2.2 mg/dL (ref 1.7–2.4)

## 2017-11-30 NOTE — H&P (Signed)
Ronnie Torres is a 84 y.o. male with a h/o CAD, LHC, 10/8, showing complete occlusion of the LAD, COPD/emphysema and persitent afib, that is in the afib clinic for tikosyn admit. He states no interruption in anticoagualtion for at least 3 weeks. He did however, took tylenol PM for sleep last night. I checked with Margaretmary Dys, PharmD, as usually we do not like benadryl within 48-72 hours of admission. She felt it would be acceptable to continue  for plans for admission with qtc today at 402 ms. He is also on low dose Zoloft that can also be qtc prolonging but again baseline qtc is well in the range to start Tikosyn. He took amiodarone in the past, but this was discontinued  the end of June 2019.  Today, he denies symptoms of palpitations, chest pain, shortness of breath, orthopnea, PND, lower extremity edema, dizziness, presyncope, syncope, or neurologic sequela. The patient is tolerating medications without difficulties and is otherwise without complaint today.       Past Medical History:  Diagnosis Date  . Acid reflux   . Arthritis   . COPD (chronic obstructive pulmonary disease) (HCC)   . Emphysema lung (HCC)   . Hiatal hernia         Past Surgical History:  Procedure Laterality Date  . CORONARY ANGIOGRAPHY N/A 11/02/2017   Procedure: CORONARY ANGIOGRAPHY (CATH LAB);  Surgeon: Tonny Bollman, MD;  Location: [facility] INVASIVE CV LAB;  Service: Cardiovascular;  Laterality: N/A;          Current Outpatient Medications  Medication Sig Dispense Refill  . albuterol (PROVENTIL HFA;VENTOLIN HFA) 108 (90 Base) MCG/ACT inhaler Inhale 2 puffs into the lungs every 4 (four) hours as needed for wheezing or shortness of breath.     Marland Kitchen apixaban (ELIQUIS) 5 MG TABS tablet Take 5 mg by mouth 2 (two) times daily.     . budesonide-formoterol (SYMBICORT) 160-4.5 MCG/ACT inhaler Inhale 2 puffs into the lungs 2 (two) times daily.    . furosemide (LASIX) 40 MG tablet Take 40 mg by mouth daily.   1  .  gabapentin (NEURONTIN) 100 MG capsule Take 100 mg by mouth 2 (two) times daily.     . meclizine (ANTIVERT) 12.5 MG tablet Take 12.5 mg by mouth 3 (three) times daily as needed for dizziness.    . metoprolol tartrate (LOPRESSOR) 25 MG tablet Take 12.5 mg by mouth 2 (two) times daily.     . midodrine (PROAMATINE) 2.5 MG tablet Take 2.5 mg by mouth 2 (two) times daily.     Marland Kitchen omeprazole (PRILOSEC) 20 MG capsule Take 20 mg by mouth 2 (two) times daily.     . sertraline (ZOLOFT) 25 MG tablet Take 25 mg by mouth daily.    . tamsulosin (FLOMAX) 0.4 MG CAPS capsule Take 0.4 mg by mouth 2 (two) times daily.      No current facility-administered medications for this encounter.          Allergies  Allergen Reactions  . Shellfish Allergy Hives  . Tape Other (See Comments)    "takes skin off"  . Sulfa Antibiotics Rash  . Trazodone And Nefazodone Rash    Social History        Socioeconomic History  . Marital status: Married    Spouse name: Not on file  . Number of children: Not on file  . Years of education: Not on file  . Highest education level: Not on file  Occupational History  . Not on file  Social Needs  . Financial resource strain: Not on file  . Food insecurity:    Worry: Not on file    Inability: Not on file  . Transportation needs:    Medical: Not on file    Non-medical: Not on file  Tobacco Use  . Smoking status: Former Smoker    Last attempt to quit: 1975    Years since quitting: 44.8  . Smokeless tobacco: Never Used  Substance and Sexual Activity  . Alcohol use: Never    Frequency: Never  . Drug use: Never  . Sexual activity: Not on file  Lifestyle  . Physical activity:    Days per week: Not on file    Minutes per session: Not on file  . Stress: Not on file  Relationships  . Social connections:    Talks on phone: Not on file    Gets together: Not on file    Attends religious service: Not on file    Active  member of club or organization: Not on file    Attends meetings of clubs or organizations: Not on file    Relationship status: Not on file  . Intimate partner violence:    Fear of current or ex partner: Not on file    Emotionally abused: Not on file    Physically abused: Not on file    Forced sexual activity: Not on file  Other Topics Concern  . Not on file  Social History Narrative  . Not on file         Family History  Problem Relation Age of Onset  . Other Mother        died in childbirth  . CVA Father   . Dementia Sister   . Pancreatic cancer Brother   . Cancer Brother   . Atrial fibrillation Brother   . Dementia Sister     ROS- All systems are reviewed and negative except as per the HPI above  Physical Exam: Vitals:   11/29/17 1019  BP: 116/68  Pulse: 77  Weight: 88.7 kg  Height: 5\' 4"  (1.626 m)      Wt Readings from Last 3 Encounters:  11/29/17 88.7 kg  11/16/17 87.5 kg  11/02/17 86.2 kg    Labs: RecentLabs       Lab Results  Component Value Date   NA 145 (H) 10/25/2017   K 4.2 10/25/2017   CL 106 10/25/2017   CO2 26 10/25/2017   GLUCOSE 82 10/25/2017   BUN 20 10/25/2017   CREATININE 1.22 10/25/2017   CALCIUM 9.8 10/25/2017     RecentLabs  No results found for: INR   RecentLabs  No results found for: CHOL, HDL, LDLCALC, TRIG     GEN- The patient is well appearing, alert and oriented x 3 today.   Head- normocephalic, atraumatic Eyes-  Sclera clear, conjunctiva pink Ears- hearing intact Oropharynx- clear Neck- supple, no JVP Lymph- no cervical lymphadenopathy Lungs- Clear to ausculation bilaterally, normal work of breathing Heart- irregular rate and rhythm, no murmurs, rubs or gallops, PMI not laterally displaced GI- soft, NT, ND, + BS Extremities- no clubbing, cyanosis, or edema MS- no significant deformity or atrophy Skin- no rash or lesion Psych- euthymic mood, full affect Neuro-  strength and sensation are intact  EKG- afib at 77 bpm, qrs int at 78 bpm, qtc 402 ms Epic records reviewed Echo-Study Conclusions  - Left ventricle: The cavity size was normal. There was mild concentric hypertrophy. Systolic function was mildly reduced.  The estimated ejection fraction was in the range of 45% to 50%. Diffuse hypokinesis. - Aortic valve: Transvalvular velocity was within the normal range. There was no stenosis. There was no regurgitation. - Mitral valve: Moderately calcified annulus. Calcification. Transvalvular velocity was within the normal range. There was no evidence for stenosis. There was trivial regurgitation. - Left atrium: The atrium was moderately dilated. - Right ventricle: The cavity size was normal. Wall thickness was normal. Systolic function was normal. - Tricuspid valve: There was mild regurgitation. - Pulmonary arteries: Systolic pressure was within the normal range. PA peak pressure: 18 mm Hg (S).  LHC-10/8-Ost 2nd Mrg to 2nd Mrg lesion is 30% stenosed.  Prox LAD to Mid LAD lesion is 100% stenosed.  1. Total occlusion of the LAD just after the first diagonal branch 2. Minor nonobstructive disease involving the left main, left circumflex, and RCA  Recommendation: Consider CTO-PCI of the LAD. There is a well-formed epicardial collateral from the obtuse marginal branch to the LAD and the length of the total occlusion appears to be fairly short. Will refer to Dr Eldridge Dace or [place].  Resume apixaban tomorrow.    Assessment and Plan: 1. Persistent  afib Pending admission today for Tikosyn load General precautions for Tikosyn discussed Did take tylenol pm last night and is on low dose Sertraline both can be qtc prolonging, but qtc is very acceptable today at 402 ms Has not missed any anticoagulation for at least 3 weeks Has been on amiodarone in the past but appears to be off drug x 4 months Can afford drug  Bmet/mag  shows acceptable levels of K+ at 3.9 and mag at 2.0 to start tikosyn but SCr cal at 55.75 indicating starting dose of 250 mcg of tikosyn  To 6E 13  Lupita Leash [facility] [address] [place], [place] [address] [phone]  EP Attending  Patient seen and examined. Agree with above. The patient presents for initiation of dofetilide. QT and electrolytes are ok.   Leonia Reeves.D.

## 2017-11-30 NOTE — Progress Notes (Signed)
pts had two 2 second pauses at 01:21 and 03:12. Will continue to monitor pt.

## 2017-11-30 NOTE — Progress Notes (Signed)
Progress Note  Patient Name: Ronnie Torres Date of Encounter: 11/30/2017  Primary Cardiologist: Ronnie Torres  Subjective   Overall feeling well without complaint.  QTC remains stable.  Inpatient Medications    Scheduled Meds: . apixaban  5 mg Oral BID  . dofetilide  250 mcg Oral BID  . furosemide  40 mg Oral Daily  . gabapentin  100 mg Oral BID  . metoprolol tartrate  12.5 mg Oral BID  . midodrine  2.5 mg Oral BID  . mometasone-formoterol  2 puff Inhalation BID  . pantoprazole  40 mg Oral Daily  . sertraline  25 mg Oral Daily  . sodium chloride flush  3 mL Intravenous Q12H  . tamsulosin  0.4 mg Oral BID   Continuous Infusions: . sodium chloride     PRN Meds: sodium chloride, albuterol, meclizine, sodium chloride flush   Vital Signs    Vitals:   11/29/17 1931 11/29/17 2025 11/30/17 0622 11/30/17 0624  BP:  (!) 145/80 (!) 134/92   Pulse:  81 75   Resp:  (!) 21    Temp:  98.2 F (36.8 C) (!) 97.5 F (36.4 C)   TempSrc:  Oral Oral   SpO2: 97% 99% 97%   Weight:    86.7 kg  Height:        Intake/Output Summary (Last 24 hours) at 11/30/2017 0749 Last data filed at 11/29/2017 2142 Gross per 24 hour  Intake 603 ml  Output -  Net 603 ml   Filed Weights   11/29/17 1202 11/30/17 0624  Weight: 88.1 kg 86.7 kg    Telemetry    Atrial fibrillation- Personally Reviewed  ECG    Atrial fibrillation- Personally Reviewed  Physical Exam   GEN: No acute distress.   Neck: No JVD Cardiac: iRRR, no murmurs, rubs, or gallops.  Respiratory: Clear to auscultation bilaterally. GI: Soft, nontender, non-distended  MS: No edema; No deformity. Neuro:  Nonfocal  Psych: Normal affect   Labs    Chemistry Recent Labs  Lab 11/29/17 1045 11/30/17 0415  NA 140 141  K 3.9 4.4  CL 112* 109  CO2 25 27  GLUCOSE 94 89  BUN 14 15  CREATININE 1.19 1.24  CALCIUM 9.2 9.1  GFRNONAA 53* 51*  GFRAA >60 59*  ANIONGAP 3* 5     HematologyNo results for input(s): WBC, RBC, HGB,  HCT, MCV, MCH, MCHC, RDW, PLT in the last 168 hours.  Cardiac EnzymesNo results for input(s): TROPONINI in the last 168 hours. No results for input(s): TROPIPOC in the last 168 hours.   BNPNo results for input(s): BNP, PROBNP in the last 168 hours.   DDimer No results for input(s): DDIMER in the last 168 hours.   Radiology    No results found.  Cardiac Studies   TTE 10/01/17 - Left ventricle: The cavity size was normal. There was mild   concentric hypertrophy. Systolic function was mildly reduced. The   estimated ejection fraction was in the range of 45% to 50%.   Diffuse hypokinesis. - Aortic valve: Transvalvular velocity was within the normal range.   There was no stenosis. There was no regurgitation. - Mitral valve: Moderately calcified annulus. Calcification.   Transvalvular velocity was within the normal range. There was no   evidence for stenosis. There was trivial regurgitation. - Left atrium: The atrium was moderately dilated. - Right ventricle: The cavity size was normal. Wall thickness was   normal. Systolic function was normal. - Tricuspid valve: There was mild  regurgitation. - Pulmonary arteries: Systolic pressure was within the normal   range. PA peak pressure: 18 mm Hg (S).  LHC 11/02/17  Ost 2nd Mrg to 2nd Mrg lesion is 30% stenosed.  Prox LAD to Mid LAD lesion is 100% stenosed. 1.  Total occlusion of the LAD just after the first diagonal branch 2.  Minor nonobstructive disease involving the left main, left circumflex, and RCA  Patient Profile     84 y.o. male with history of persistent atrial fibrillation, GERD, COPD, BPH who presents to the hospital for dofetilide load.  Has tried amiodarone and failed.  This is likely his last chance of normal rhythm.  Assessment & Plan    1.  Persistent atrial fibrillation: Admitted for dofetilide load.  QTC remains stable.  We Ronnie Torres continue at current dose.  Potassium and magnesium at goal.  2.  Coronary artery disease  with chronic stable angina: Left heart cath with chronically occluded LAD.  No current chest pain.  3.  Lower extremity edema: We Ronnie Torres give IV Lasix, 1 dose today.  For questions or updates, please contact Ronnie Torres Please consult [email or web address] for contact info under        Signed, Ronnie Krotz Jorja Loa, MD  11/30/2017, 7:49 AM

## 2017-12-01 ENCOUNTER — Other Ambulatory Visit: Payer: Self-pay

## 2017-12-01 ENCOUNTER — Encounter (HOSPITAL_COMMUNITY)
Admission: AD | Disposition: A | Discharge status: Home / Self Care | Payer: Self-pay | Source: Home / Self Care | Attending: Cardiology

## 2017-12-01 ENCOUNTER — Inpatient Hospital Stay (HOSPITAL_COMMUNITY): Payer: Medicare Other | Admitting: Anesthesiology

## 2017-12-01 ENCOUNTER — Encounter (HOSPITAL_COMMUNITY): Payer: Self-pay

## 2017-12-01 HISTORY — PX: CARDIOVERSION: SHX1299

## 2017-12-01 LAB — BASIC METABOLIC PANEL
Anion gap: 4 — ABNORMAL LOW (ref 5–15)
BUN: 14 mg/dL (ref 8–23)
CHLORIDE: 109 mmol/L (ref 98–111)
CO2: 28 mmol/L (ref 22–32)
CREATININE: 1.2 mg/dL (ref 0.61–1.24)
Calcium: 9.4 mg/dL (ref 8.9–10.3)
GFR calc Af Amer: >60 mL/min (ref >60)
GFR calc non Af Amer: 53 mL/min — ABNORMAL LOW (ref >60)
GLUCOSE: 94 mg/dL (ref 70–99)
Potassium: 4 mmol/L (ref 3.5–5.1)
SODIUM: 141 mmol/L (ref 135–145)

## 2017-12-01 LAB — MAGNESIUM: Magnesium: 2.1 mg/dL (ref 1.7–2.4)

## 2017-12-01 SURGERY — CARDIOVERSION
Anesthesia: General

## 2017-12-01 MED ORDER — PROPOFOL 10 MG/ML IV BOLUS
INTRAVENOUS | Status: DC | PRN
  Administered 2017-12-01: 20 mg via INTRAVENOUS
  Administered 2017-12-01: 10 mg via INTRAVENOUS
  Administered 2017-12-01: 40 mg via INTRAVENOUS

## 2017-12-01 MED ORDER — LIDOCAINE 2% (20 MG/ML) 5 ML SYRINGE
INTRAMUSCULAR | Status: DC | PRN
  Administered 2017-12-01: 100 mg via INTRAVENOUS

## 2017-12-01 NOTE — Anesthesia Preprocedure Evaluation (Addendum)
Anesthesia Evaluation  Patient identified by MRN, date of birth, ID band Patient awake    Reviewed: Allergy & Precautions, H&P , NPO status , Patient's Chart, lab work & pertinent test results  Airway Mallampati: II  TM Distance: >3 FB Neck ROM: Full    Dental no notable dental hx. (+) Upper Dentures, Lower Dentures, Dental Advisory Given   Pulmonary COPD,  COPD inhaler, former smoker,    Pulmonary exam normal breath sounds clear to auscultation       Cardiovascular + angina + CAD  + dysrhythmias Atrial Fibrillation  Rhythm:Irregular Rate:Normal     Neuro/Psych negative neurological ROS  negative psych ROS   GI/Hepatic Neg liver ROS, hiatal hernia, GERD  Medicated and Controlled,  Endo/Other  negative endocrine ROS  Renal/GU negative Renal ROS  negative genitourinary   Musculoskeletal  (+) Arthritis , Osteoarthritis,    Abdominal   Peds  Hematology negative hematology ROS (+)   Anesthesia Other Findings   Reproductive/Obstetrics negative OB ROS                            Anesthesia Physical Anesthesia Plan  ASA: III  Anesthesia Plan: General   Post-op Pain Management:    Induction: Intravenous  PONV Risk Score and Plan: 2 and Treatment may vary due to age or medical condition  Airway Management Planned: Mask  Additional Equipment:   Intra-op Plan:   Post-operative Plan:   Informed Consent: I have reviewed the patients History and Physical, chart, labs and discussed the procedure including the risks, benefits and alternatives for the proposed anesthesia with the patient or authorized representative who has indicated his/her understanding and acceptance.   Dental advisory given  Plan Discussed with: CRNA  Anesthesia Plan Comments:         Anesthesia Quick Evaluation

## 2017-12-01 NOTE — Anesthesia Postprocedure Evaluation (Signed)
Anesthesia Post Note  Patient: Ronnie Torres  Procedure(s) Performed: CARDIOVERSION (N/A )     Anesthesia Post Evaluation  Last Vitals:  Vitals:   12/01/17 1530 12/01/17 2019  BP: 98/68 115/75  Pulse:  71  Resp:  12  Temp:  36.7 C  SpO2:  99%    Last Pain:  Vitals:   12/01/17 2019  TempSrc: Oral  PainSc:                  Carrisa Keller

## 2017-12-01 NOTE — Transfer of Care (Signed)
Immediate Anesthesia Transfer of Care Note  Patient: Ronnie Torres  Procedure(s) Performed: CARDIOVERSION (N/A )  Patient Location: Endoscopy Unit  Anesthesia Type:MAC  Level of Consciousness: drowsy and responds to stimulation  Airway & Oxygen Therapy: Patient Spontanous Breathing  Post-op Assessment: Report given to RN  Post vital signs: Reviewed and stable  Last Vitals:  Vitals Value Taken Time  BP    Temp    Pulse    Resp    SpO2      Last Pain:  Vitals:   12/01/17 1255  TempSrc: Oral  PainSc: 0-No pain      Patients Stated Pain Goal: 0 (17/49/44 9675)  Complications: No apparent anesthesia complications

## 2017-12-01 NOTE — Progress Notes (Signed)
Patient places self on home CPAP. Does not need assistance.

## 2017-12-01 NOTE — Progress Notes (Signed)
RN gave pt dulera. RT will continue to monitor.

## 2017-12-01 NOTE — CV Procedure (Signed)
    Electrical Cardioversion Procedure Note Ronnie Torres [id number] January 17, 1930  Procedure: Electrical Cardioversion Indications:  Atrial Fibrillation  Time Out: Verified patient identification, verified procedure,medications/allergies/relevent history reviewed, required imaging and test results available.  Performed  Procedure Details  The patient was NPO after midnight. Anesthesia was administered at the beside  by anesthesia service with propofol.  Cardioversion was performed with synchronized biphasic defibrillation via AP pads with 120, 200, 200 joules.  3 attempt(s) were performed.  The patient did not maintain sinus rhythm. Had a few beats of NSR but quickly transformed into AFIB. The patient tolerated the procedure well   IMPRESSION:  Unsuccessful cardioversion on Tikosyn.   Mark Skains 12/01/2017, 1:17 PM

## 2017-12-01 NOTE — Interval H&P Note (Signed)
History and Physical Interval Note:  12/01/2017 1:02 PM  Ronnie Torres  has presented today for surgery, with the diagnosis of a fib, tikostyn admit  The various methods of treatment have been discussed with the patient and family. After consideration of risks, benefits and other options for treatment, the patient has consented to  Procedure(s): CARDIOVERSION (N/A) as a surgical intervention .  The patient's history has been reviewed, patient examined, no change in status, stable for surgery.  I have reviewed the patient's chart and labs.  Questions were answered to the patient's satisfaction.     [employer]

## 2017-12-01 NOTE — Care Management Note (Addendum)
Case Management Note  Patient Details  Name: Ronnie Torres MRN: [id number] Date of Birth: 01-15-1930  Subjective/Objective:  Pt presented for Tikosyn Load! PTA from home with support of wife and daughter. Pt's co pay came back at $17.68. Daughter states that patient is in the donut hole and it may cost more.                    Action/Plan: CM discussed with daughter- patient having difficulty affording medications. [facility] information provided to patient and patient may need to follow up with A fib clinic to see if they can assist with patient assistance application. If patient continues on Tikosyn- pt is agreeable to get Rx's from [facility]. Daughter had questions about Eliquis and cost as well. Patient was given the 30 day free Eliquis Card- he may need additional Rx to go with it.Daughter not sure if he has utilized in the past.   Expected Discharge Date:                  Expected Discharge Plan:  Home/Self Care  In-House Referral:  NA  Discharge planning Services  CM Consult, Medication Assistance  Post Acute Care Choice:  NA Choice offered to:  NA  DME Arranged:  N/A DME Agency:  NA  HH Arranged:  NA HH Agency:  NA  Status of Service:  Completed, signed off  If discussed at Long Length of Stay Meetings, dates discussed:    Additional Comments: 1011 12-02-17 Tomi Bamberger, RN, BSN [phone] CM did speak with Kennyth Arnold in the At[facility] Fib Clinic-to work with patient in regards to patient assistance application since in the donut hole. No further needs from CM at this time.  Gala Lewandowsky, RN 12/01/2017, 4:21 PM

## 2017-12-01 NOTE — Progress Notes (Signed)
Progress Note  Patient Name: Ronnie Torres Date of Encounter: 12/01/2017  Primary Cardiologist: Elberta Fortis  Subjective   Feeling well. QTc stable. Remains in atrial fibrillation  Inpatient Medications    Scheduled Meds: . apixaban  5 mg Oral BID  . dofetilide  250 mcg Oral BID  . furosemide  40 mg Oral Daily  . gabapentin  100 mg Oral BID  . metoprolol tartrate  12.5 mg Oral BID  . midodrine  2.5 mg Oral BID  . mometasone-formoterol  2 puff Inhalation BID  . pantoprazole  40 mg Oral Daily  . sertraline  25 mg Oral Daily  . sodium chloride flush  3 mL Intravenous Q12H  . tamsulosin  0.4 mg Oral BID   Continuous Infusions: . sodium chloride     PRN Meds: sodium chloride, albuterol, meclizine, sodium chloride flush   Vital Signs    Vitals:   11/30/17 0624 11/30/17 0819 11/30/17 2010 12/01/17 0633  BP:   123/74 (!) 143/85  Pulse:   82 81  Resp:   20 18  Temp:   98.4 F (36.9 C) 98.4 F (36.9 C)  TempSrc:   Oral Oral  SpO2:  94% 96% 98%  Weight: 86.7 kg   83.4 kg  Height:        Intake/Output Summary (Last 24 hours) at 12/01/2017 1610 Last data filed at 11/30/2017 1853 Gross per 24 hour  Intake 720 ml  Output -  Net 720 ml   Filed Weights   11/29/17 1202 11/30/17 0624 12/01/17 9604  Weight: 88.1 kg 86.7 kg 83.4 kg    Telemetry    Atrial fibrillation- Personally Reviewed  ECG    Atrial fibrillation- Personally Reviewed  Physical Exam   GEN: Well nourished, well developed, in no acute distress  HEENT: normal  Neck: no JVD, carotid bruits, or masses Cardiac: irregular; no murmurs, rubs, or gallops,no edema  Respiratory:  clear to auscultation bilaterally, normal work of breathing GI: soft, nontender, nondistended, + BS MS: no deformity or atrophy  Skin: warm and dry Neuro:  Strength and sensation are intact Psych: euthymic mood, full affect   Labs    Chemistry Recent Labs  Lab 11/29/17 1045 11/30/17 0415 12/01/17 0425  NA 140 141 141  K  3.9 4.4 4.0  CL 112* 109 109  CO2 25 27 28   GLUCOSE 94 89 94  BUN 14 15 14   CREATININE 1.19 1.24 1.20  CALCIUM 9.2 9.1 9.4  GFRNONAA 53* 51* 53*  GFRAA >60 59* >60  ANIONGAP 3* 5 4*     HematologyNo results for input(s): WBC, RBC, HGB, HCT, MCV, MCH, MCHC, RDW, PLT in the last 168 hours.  Cardiac EnzymesNo results for input(s): TROPONINI in the last 168 hours. No results for input(s): TROPIPOC in the last 168 hours.   BNPNo results for input(s): BNP, PROBNP in the last 168 hours.   DDimer No results for input(s): DDIMER in the last 168 hours.   Radiology    No results found.  Cardiac Studies   TTE 10/01/17 - Left ventricle: The cavity size was normal. There was mild   concentric hypertrophy. Systolic function was mildly reduced. The   estimated ejection fraction was in the range of 45% to 50%.   Diffuse hypokinesis. - Aortic valve: Transvalvular velocity was within the normal range.   There was no stenosis. There was no regurgitation. - Mitral valve: Moderately calcified annulus. Calcification.   Transvalvular velocity was within the normal range. There was  no   evidence for stenosis. There was trivial regurgitation. - Left atrium: The atrium was moderately dilated. - Right ventricle: The cavity size was normal. Wall thickness was   normal. Systolic function was normal. - Tricuspid valve: There was mild regurgitation. - Pulmonary arteries: Systolic pressure was within the normal   range. PA peak pressure: 18 mm Hg (S).  LHC 11/02/17  Ost 2nd Mrg to 2nd Mrg lesion is 30% stenosed.  Prox LAD to Mid LAD lesion is 100% stenosed. 1.  Total occlusion of the LAD just after the first diagonal branch 2.  Minor nonobstructive disease involving the left main, left circumflex, and RCA  Patient Profile     84 y.o. male with history of persistent atrial fibrillation, GERD, COPD, BPH who presents to the hospital for dofetilide load.  Has tried amiodarone and failed.  This is likely  his last chance of normal rhythm.  Assessment & Plan    1.  Persistent atrial fibrillation: Tolerating tikosyn with stable QTc. Goal K>4, Mg>2. Plan for cardioversion today.  2.  Coronary artery disease with chronic stable angina: chronically occluded LAD. No current chest pain  3.  Lower extremity edema: post lasix. Reassess tomorrow for dosing need at home  For questions or updates, please contact [facility] Please consult [email or web address] for contact info under        Signed, Isamu Trammel Jorja Loa, MD  12/01/2017, 8:07 AM

## 2017-12-01 NOTE — H&P (View-Only) (Signed)
 Progress Note  Patient Name: Ronnie Torres Date of Encounter: 12/01/2017  Primary Cardiologist: Tasmia Blumer  Subjective   Feeling well. QTc stable. Remains in atrial fibrillation  Inpatient Medications    Scheduled Meds: . apixaban  5 mg Oral BID  . dofetilide  250 mcg Oral BID  . furosemide  40 mg Oral Daily  . gabapentin  100 mg Oral BID  . metoprolol tartrate  12.5 mg Oral BID  . midodrine  2.5 mg Oral BID  . mometasone-formoterol  2 puff Inhalation BID  . pantoprazole  40 mg Oral Daily  . sertraline  25 mg Oral Daily  . sodium chloride flush  3 mL Intravenous Q12H  . tamsulosin  0.4 mg Oral BID   Continuous Infusions: . sodium chloride     PRN Meds: sodium chloride, albuterol, meclizine, sodium chloride flush   Vital Signs    Vitals:   11/30/17 0624 11/30/17 0819 11/30/17 2010 12/01/17 0633  BP:   123/74 (!) 143/85  Pulse:   82 81  Resp:   20 18  Temp:   98.4 F (36.9 C) 98.4 F (36.9 C)  TempSrc:   Oral Oral  SpO2:  94% 96% 98%  Weight: 86.7 kg   83.4 kg  Height:        Intake/Output Summary (Last 24 hours) at 12/01/2017 0807 Last data filed at 11/30/2017 1853 Gross per 24 hour  Intake 720 ml  Output -  Net 720 ml   Filed Weights   11/29/17 1202 11/30/17 0624 12/01/17 0633  Weight: 88.1 kg 86.7 kg 83.4 kg    Telemetry    Atrial fibrillation- Personally Reviewed  ECG    Atrial fibrillation- Personally Reviewed  Physical Exam   GEN: Well nourished, well developed, in no acute distress  HEENT: normal  Neck: no JVD, carotid bruits, or masses Cardiac: irregular; no murmurs, rubs, or gallops,no edema  Respiratory:  clear to auscultation bilaterally, normal work of breathing GI: soft, nontender, nondistended, + BS MS: no deformity or atrophy  Skin: warm and dry Neuro:  Strength and sensation are intact Psych: euthymic mood, full affect   Labs    Chemistry Recent Labs  Lab 11/29/17 1045 11/30/17 0415 12/01/17 0425  NA 140 141 141  K  3.9 4.4 4.0  CL 112* 109 109  CO2 25 27 28  GLUCOSE 94 89 94  BUN 14 15 14  CREATININE 1.19 1.24 1.20  CALCIUM 9.2 9.1 9.4  GFRNONAA 53* 51* 53*  GFRAA >60 59* >60  ANIONGAP 3* 5 4*     HematologyNo results for input(s): WBC, RBC, HGB, HCT, MCV, MCH, MCHC, RDW, PLT in the last 168 hours.  Cardiac EnzymesNo results for input(s): TROPONINI in the last 168 hours. No results for input(s): TROPIPOC in the last 168 hours.   BNPNo results for input(s): BNP, PROBNP in the last 168 hours.   DDimer No results for input(s): DDIMER in the last 168 hours.   Radiology    No results found.  Cardiac Studies   TTE 10/01/17 - Left ventricle: The cavity size was normal. There was mild   concentric hypertrophy. Systolic function was mildly reduced. The   estimated ejection fraction was in the range of 45% to 50%.   Diffuse hypokinesis. - Aortic valve: Transvalvular velocity was within the normal range.   There was no stenosis. There was no regurgitation. - Mitral valve: Moderately calcified annulus. Calcification.   Transvalvular velocity was within the normal range. There was   no   evidence for stenosis. There was trivial regurgitation. - Left atrium: The atrium was moderately dilated. - Right ventricle: The cavity size was normal. Wall thickness was   normal. Systolic function was normal. - Tricuspid valve: There was mild regurgitation. - Pulmonary arteries: Systolic pressure was within the normal   range. PA peak pressure: 18 mm Hg (S).  LHC 11/02/17  Ost 2nd Mrg to 2nd Mrg lesion is 30% stenosed.  Prox LAD to Mid LAD lesion is 100% stenosed. 1.  Total occlusion of the LAD just after the first diagonal branch 2.  Minor nonobstructive disease involving the left main, left circumflex, and RCA  Patient Profile     84 y.o. male with history of persistent atrial fibrillation, GERD, COPD, BPH who presents to the hospital for dofetilide load.  Has tried amiodarone and failed.  This is likely  his last chance of normal rhythm.  Assessment & Plan    1.  Persistent atrial fibrillation: Tolerating tikosyn with stable QTc. Goal K>4, Mg>2. Plan for cardioversion today.  2.  Coronary artery disease with chronic stable angina: chronically occluded LAD. No current chest pain  3.  Lower extremity edema: post lasix. Reassess tomorrow for dosing need at home  For questions or updates, please contact [facility] Please consult [email or web address] for contact info under        Signed, Kassadie Pancake Martin Pattijo Juste, MD  12/01/2017, 8:07 AM    

## 2017-12-02 LAB — BASIC METABOLIC PANEL
ANION GAP: 8 (ref 5–15)
BUN: 17 mg/dL (ref 8–23)
CO2: 25 mmol/L (ref 22–32)
Calcium: 9.3 mg/dL (ref 8.9–10.3)
Chloride: 107 mmol/L (ref 98–111)
Creatinine, Ser: 1.35 mg/dL — ABNORMAL HIGH (ref 0.61–1.24)
GFR calc Af Amer: 53 mL/min — ABNORMAL LOW (ref >60)
GFR, EST NON AFRICAN AMERICAN: 46 mL/min — AB (ref >60)
GLUCOSE: 98 mg/dL (ref 70–99)
Potassium: 3.9 mmol/L (ref 3.5–5.1)
Sodium: 140 mmol/L (ref 135–145)

## 2017-12-02 LAB — MAGNESIUM: Magnesium: 2.1 mg/dL (ref 1.7–2.4)

## 2017-12-02 MED ORDER — POTASSIUM CHLORIDE CRYS ER 20 MEQ PO TBCR
20.0000 meq | EXTENDED_RELEASE_TABLET | ORAL | Status: AC
  Administered 2017-12-02 (×2): 20 meq via ORAL
  Filled 2017-12-02 (×2): qty 1

## 2017-12-02 MED ORDER — DOFETILIDE 250 MCG PO CAPS: 250.0000 ug | ORAL_CAPSULE | Freq: Two times a day (BID) | ORAL | 1 refills | Status: DC

## 2017-12-02 MED ORDER — APIXABAN 5 MG PO TABS: 5.0000 mg | ORAL_TABLET | Freq: Two times a day (BID) | ORAL | 0 refills | Status: DC

## 2017-12-02 MED FILL — TIKOSYN 250 MCG CAPS: 250 | 7 days supply | Qty: 14 | Fill #0

## 2017-12-02 NOTE — Discharge Summary (Addendum)
ELECTROPHYSIOLOGY PROCEDURE DISCHARGE SUMMARY    Patient ID: Ronnie Torres,  MRN: [id number], DOB/AGE: 07/06/1929 84 y.o.  Admit date: 11/29/2017 Discharge date: 12/02/2017  Primary Care Physician: Jim Like, MD Electrophysiologist: [facility]  Primary Discharge Diagnosis:  1.  Persistent atrial fibrillation status post Tikosyn loading this admission  Secondary Discharge Diagnosis:  1.  COPD 2.  Arthritis 3.  Emphysema 4.  GERD  Allergies  Allergen Reactions  . Shellfish Allergy Hives  . Tape Other (See Comments)    "takes skin off"  . Sulfa Antibiotics Rash  . Trazodone And Nefazodone Rash     Procedures This Admission:  1.  Tikosyn loading 2.  Direct current cardioversion on 12/01/17 by Dr Anne Fu which successfully restored SR but the patient had ERAF.  There were no early apparent complications.   Brief HPI: Lathan Gieselman is a 84 y.o. male with a past medical history as noted above.  They were referred to EP in the outpatient setting for treatment options of atrial fibrillation.  Risks, benefits, and alternatives to Tikosyn were reviewed with the patient who wished to proceed.    Hospital Course:  The patient was admitted and Tikosyn was initiated.  Renal function and electrolytes were followed during the hospitalization.  Their QTc remained stable.  On 12/01/17 they underwent direct current cardioversion which restored sinus rhythm but the patient developed ERAF.  They were monitored until discharge on telemetry which demonstrated atrial fibrillation.  On the day of discharge, they were examined by Dr Elberta Fortis who considered them stable for discharge to home.  Follow-up has been arranged with AF clinic in 1 week. If still in AF at that time, plan to try repeat DCCV.  If repeat cardioversion fails, could consider pacemaker and AVN ablation - rates are elevated with any activity.   Physical Exam: Vitals:   12/01/17 2019 12/02/17 0446 12/02/17 0709 12/02/17 0827  BP:  115/75 110/79  104/65  Pulse: 71 79 72   Resp: 12 15 16    Temp: 98 F (36.7 C) 97.6 F (36.4 C)    TempSrc: Oral Oral    SpO2: 99% 99% 97%   Weight:  83.7 kg    Height:        GEN- The patient is elderly appearing, alert and oriented x 3 today.   HEENT: normocephalic, atraumatic; sclera clear, conjunctiva pink; hearing intact; oropharynx clear; neck supple  Lungs- Clear to ausculation bilaterally, normal work of breathing.  No wheezes, rales, rhonchi Heart- Irregular rate and rhythm  GI- soft, non-tender, non-distended, bowel sounds present  Extremities- no clubbing, cyanosis, or edema  MS- no significant deformity or atrophy Skin- warm and dry, no rash or lesion Psych- euthymic mood, full affect Neuro- strength and sensation are intact   Labs:   Lab Results  Component Value Date   WBC 7.7 10/25/2017   HGB 13.8 10/25/2017   HCT 42.1 10/25/2017   MCV 84 10/25/2017   PLT 128 (L) 10/25/2017    Recent Labs  Lab 12/02/17 0350  NA 140  K 3.9  CL 107  CO2 25  BUN 17  CREATININE 1.35*  CALCIUM 9.3  GLUCOSE 98     Discharge Medications:  Allergies as of 12/02/2017      Reactions   Shellfish Allergy Hives   Tape Other (See Comments)   "takes skin off"   Sulfa Antibiotics Rash   Trazodone And Nefazodone Rash      Medication List    TAKE these medications  albuterol 108 (90 Base) MCG/ACT inhaler Commonly known as:  PROVENTIL HFA;VENTOLIN HFA Inhale 2 puffs into the lungs every 4 (four) hours as needed for wheezing or shortness of breath.   apixaban 5 MG Tabs tablet Commonly known as:  ELIQUIS Take 5 mg by mouth 2 (two) times daily.   dofetilide 250 MCG capsule Commonly known as:  TIKOSYN Take 1 capsule (250 mcg total) by mouth 2 (two) times daily.   furosemide 40 MG tablet Commonly known as:  LASIX Take 40 mg by mouth every morning.   gabapentin 100 MG capsule Commonly known as:  NEURONTIN Take 100 mg by mouth 2 (two) times daily.   metoprolol  tartrate 25 MG tablet Commonly known as:  LOPRESSOR Take 12.5 mg by mouth 2 (two) times daily.   midodrine 2.5 MG tablet Commonly known as:  PROAMATINE Take 2.5 mg by mouth 2 (two) times daily.   omeprazole 20 MG capsule Commonly known as:  PRILOSEC Take 20 mg by mouth 2 (two) times daily.   SENOKOT S 8.6-50 MG tablet Generic drug:  senna-docusate Take 1 tablet by mouth at bedtime as needed for mild constipation.   sertraline 25 MG tablet Commonly known as:  ZOLOFT Take 25 mg by mouth at bedtime.   STOPAIN 8 % Liqd Generic drug:  Menthol (Topical Analgesic) Apply 1 application topically daily as needed (joint pain).   SYMBICORT 160-4.5 MCG/ACT inhaler Generic drug:  budesonide-formoterol Inhale 2 puffs into the lungs 2 (two) times daily.   tamsulosin 0.4 MG Caps capsule Commonly known as:  FLOMAX Take 0.4 mg by mouth 2 (two) times daily.   triamcinolone cream 0.1 % Commonly known as:  KENALOG Apply 1 application topically daily as needed (rash on arms).       Disposition:   Follow-up Information    [facility] Follow up on 12/10/2017.   Specialty:  Cardiology Why:  at [facility] information: [address] [id number] Wilhemina Bonito [place] [address] [phone]          Duration of Discharge Encounter: Greater than 30 minutes including physician time.  Signed, Gypsy Balsam, NP 12/02/2017 11:15 AM   I have seen and examined this patient with Gypsy Balsam.  Agree with above, note added to reflect my findings.  On exam, iRRR, no murmurs, lungs clear.  Patient admitted for dofetilide loading.  QTC is remained stable.  He had an attempted cardioversion and remained in sinus rhythm for 5 seconds.  As his QTC is stable, Dotsie Gillette plan for discharge today.  He Cletis Muma follow-up in A. fib clinic in 1 week.  Should he remain in atrial fibrillation, would arrange for cardioversion.  If he does not convert to sinus rhythm, he may be a  pacemaker implant with AV nodal ablation candidate.  [place] Gustafson M. Lary Eckardt MD 12/02/2017 2:39 PM

## 2017-12-02 NOTE — Care Management Important Message (Signed)
Important Message  Patient Details  Name: Ronnie Torres MRN: [id number] Date of Birth: 1929-02-06   Medicare Important Message Given:  Yes    Jenee Spaugh P Jonathon Tan 12/02/2017, 4:04 PM

## 2017-12-02 NOTE — Progress Notes (Signed)
Discharge instructions reviewed with pt and daughter. They do not have any questions. Tikosyn supply at bedside.

## 2017-12-02 NOTE — Discharge Instructions (Signed)

## 2017-12-04 ENCOUNTER — Encounter (HOSPITAL_COMMUNITY): Payer: Self-pay | Admitting: Cardiology

## 2017-12-10 ENCOUNTER — Ambulatory Visit (HOSPITAL_COMMUNITY)
Admission: RE | Admit: 2017-12-10 | Discharge: 2017-12-10 | Disposition: A | Discharge status: Home / Self Care | Payer: Medicare Other | Source: Ambulatory Visit | Attending: Nurse Practitioner | Admitting: Nurse Practitioner

## 2017-12-10 ENCOUNTER — Other Ambulatory Visit (HOSPITAL_COMMUNITY): Payer: Self-pay | Admitting: *Deleted

## 2017-12-10 ENCOUNTER — Encounter (HOSPITAL_COMMUNITY): Payer: Self-pay | Admitting: Nurse Practitioner

## 2017-12-10 VITALS — BP 112/64 | HR 71 | Ht 64.0 in | Wt 188.2 lb

## 2017-12-10 DIAGNOSIS — I4819 Other persistent atrial fibrillation: Secondary | ICD-10-CM | POA: Diagnosis present

## 2017-12-10 DIAGNOSIS — Z79899 Other long term (current) drug therapy: Secondary | ICD-10-CM | POA: Diagnosis not present

## 2017-12-10 DIAGNOSIS — K219 Gastro-esophageal reflux disease without esophagitis: Secondary | ICD-10-CM | POA: Diagnosis not present

## 2017-12-10 DIAGNOSIS — G4730 Sleep apnea, unspecified: Secondary | ICD-10-CM | POA: Diagnosis not present

## 2017-12-10 DIAGNOSIS — K449 Diaphragmatic hernia without obstruction or gangrene: Secondary | ICD-10-CM | POA: Insufficient documentation

## 2017-12-10 DIAGNOSIS — Z882 Allergy status to sulfonamides status: Secondary | ICD-10-CM | POA: Diagnosis not present

## 2017-12-10 DIAGNOSIS — Z7901 Long term (current) use of anticoagulants: Secondary | ICD-10-CM | POA: Insufficient documentation

## 2017-12-10 DIAGNOSIS — Z87891 Personal history of nicotine dependence: Secondary | ICD-10-CM | POA: Diagnosis not present

## 2017-12-10 DIAGNOSIS — Z888 Allergy status to other drugs, medicaments and biological substances status: Secondary | ICD-10-CM | POA: Diagnosis not present

## 2017-12-10 DIAGNOSIS — J449 Chronic obstructive pulmonary disease, unspecified: Secondary | ICD-10-CM | POA: Diagnosis not present

## 2017-12-10 LAB — BASIC METABOLIC PANEL
Anion gap: 7 (ref 5–15)
BUN: 19 mg/dL (ref 8–23)
CO2: 27 mmol/L (ref 22–32)
Calcium: 9.5 mg/dL (ref 8.9–10.3)
Chloride: 105 mmol/L (ref 98–111)
Creatinine, Ser: 1.24 mg/dL (ref 0.61–1.24)
GFR calc Af Amer: 59 mL/min — ABNORMAL LOW (ref >60)
GFR calc non Af Amer: 51 mL/min — ABNORMAL LOW (ref >60)
GLUCOSE: 92 mg/dL (ref 70–99)
POTASSIUM: 3.6 mmol/L (ref 3.5–5.1)
Sodium: 139 mmol/L (ref 135–145)

## 2017-12-10 LAB — MAGNESIUM: Magnesium: 2 mg/dL (ref 1.7–2.4)

## 2017-12-10 MED ORDER — POTASSIUM CHLORIDE CRYS ER 20 MEQ PO TBCR: EXTENDED_RELEASE_TABLET | ORAL | 0 refills | Status: DC

## 2017-12-10 NOTE — Progress Notes (Signed)
Primary Care Physician: Jim Like, MD Referring Physician:Dr. Molinda Bailiff Borgerding is a 84 y.o. male with a h/o CAD, LHC, 10/8, showing complete occlusion of the LAD, COPD/emphysema and persitent afib, that is in the afib clinic for tikosyn admit. He states no interruption in anticoagualtion for at least 3 weeks. He did however, took tylenol PM for sleep last night. I checked with Margaretmary Dys, PharmD, as usually we do not like benadryl within 48-72 hours of admission. She felt it would be acceptable to continue  for plans for admission with qtc today at 402 ms. He is also on low dose Zoloft that can also be qtc prolonging but again baseline qtc is well in the range to start Tikosyn. He took amiodarone in the past, but this was discontinued  the end of June 2019.  F/u in afib clinic after Tikosyn loading last week. Unfortunately,he had successful cardioversion but had ERAF. He was discharged in afib. Today, in the Afib clinic, by EKG and rhythm strip appears to be in SR but with frequent PAC's. He feels to be slightly less short of breath with exertion.   Today, he denies symptoms of palpitations, chest pain, shortness of breath, orthopnea, PND, lower extremity edema, dizziness, presyncope, syncope, or neurologic sequela. The patient is tolerating medications without difficulties and is otherwise without complaint today.   Past Medical History:  Diagnosis Date  . Acid reflux   . Arthritis   . COPD (chronic obstructive pulmonary disease) (HCC)   . Emphysema lung (HCC)   . Hiatal hernia   . Sleep apnea    Past Surgical History:  Procedure Laterality Date  . CARDIOVERSION N/A 12/01/2017   Procedure: CARDIOVERSION;  Surgeon: Jake Bathe, MD;  Location: [facility] ENDOSCOPY;  Service: Cardiovascular;  Laterality: N/A;  . CORONARY ANGIOGRAPHY N/A 11/02/2017   Procedure: CORONARY ANGIOGRAPHY (CATH LAB);  Surgeon: Tonny Bollman, MD;  Location: [facility] INVASIVE CV LAB;  Service: Cardiovascular;   Laterality: N/A;    Current Outpatient Medications  Medication Sig Dispense Refill  . albuterol (PROVENTIL HFA;VENTOLIN HFA) 108 (90 Base) MCG/ACT inhaler Inhale 2 puffs into the lungs every 4 (four) hours as needed for wheezing or shortness of breath.     Marland Kitchen apixaban (ELIQUIS) 5 MG TABS tablet Take 1 tablet (5 mg total) by mouth 2 (two) times daily. 60 tablet 0  . budesonide-formoterol (SYMBICORT) 160-4.5 MCG/ACT inhaler Inhale 2 puffs into the lungs 2 (two) times daily.    Marland Kitchen dofetilide (TIKOSYN) 250 MCG capsule Take 1 capsule (250 mcg total) by mouth 2 (two) times daily. 180 capsule 1  . furosemide (LASIX) 40 MG tablet Take 40 mg by mouth every morning.   1  . gabapentin (NEURONTIN) 100 MG capsule Take 100 mg by mouth 2 (two) times daily.     . Menthol, Topical Analgesic, (STOPAIN) 8 % LIQD Apply 1 application topically daily as needed (joint pain).    . metoprolol tartrate (LOPRESSOR) 25 MG tablet Take 12.5 mg by mouth 2 (two) times daily.     . midodrine (PROAMATINE) 2.5 MG tablet Take 2.5 mg by mouth 2 (two) times daily.     Marland Kitchen omeprazole (PRILOSEC) 20 MG capsule Take 20 mg by mouth 2 (two) times daily.     Marland Kitchen senna-docusate (SENOKOT S) 8.6-50 MG tablet Take 1 tablet by mouth at bedtime as needed for mild constipation.    . sertraline (ZOLOFT) 25 MG tablet Take 25 mg by mouth at bedtime.     Marland Kitchen  tamsulosin (FLOMAX) 0.4 MG CAPS capsule Take 0.4 mg by mouth 2 (two) times daily.     Marland Kitchen. triamcinolone cream (KENALOG) 0.1 % Apply 1 application topically daily as needed (rash on arms).     No current facility-administered medications for this encounter.     Allergies  Allergen Reactions  . Shellfish Allergy Hives  . Tape Other (See Comments)    "takes skin off"  . Sulfa Antibiotics Rash  . Trazodone And Nefazodone Rash    Social History   Socioeconomic History  . Marital status: Married    Spouse name: Not on file  . Number of children: Not on file  . Years of education: Not on file  .  Highest education level: Not on file  Occupational History  . Not on file  Social Needs  . Financial resource strain: Not on file  . Food insecurity:    Worry: Not on file    Inability: Not on file  . Transportation needs:    Medical: Not on file    Non-medical: Not on file  Tobacco Use  . Smoking status: Former Smoker    Last attempt to quit: 1975    Years since quitting: 44.9  . Smokeless tobacco: Never Used  Substance and Sexual Activity  . Alcohol use: Never    Frequency: Never  . Drug use: Never  . Sexual activity: Not on file  Lifestyle  . Physical activity:    Days per week: Not on file    Minutes per session: Not on file  . Stress: Not on file  Relationships  . Social connections:    Talks on phone: Not on file    Gets together: Not on file    Attends religious service: Not on file    Active member of club or organization: Not on file    Attends meetings of clubs or organizations: Not on file    Relationship status: Not on file  . Intimate partner violence:    Fear of current or ex partner: Not on file    Emotionally abused: Not on file    Physically abused: Not on file    Forced sexual activity: Not on file  Other Topics Concern  . Not on file  Social History Narrative  . Not on file    Family History  Problem Relation Age of Onset  . Other Mother        died in childbirth  . CVA Father   . Dementia Sister   . Pancreatic cancer Brother   . Cancer Brother   . Atrial fibrillation Brother   . Dementia Sister     ROS- All systems are reviewed and negative except as per the HPI above  Physical Exam: Vitals:   12/10/17 0913  BP: 112/64  Pulse: 71  Weight: 85.4 kg  Height: 5\' 4"  (1.626 m)   Wt Readings from Last 3 Encounters:  12/10/17 85.4 kg  12/02/17 83.7 kg  11/29/17 88.7 kg    Labs: Lab Results  Component Value Date   NA 140 12/02/2017   K 3.9 12/02/2017   CL 107 12/02/2017   CO2 25 12/02/2017   GLUCOSE 98 12/02/2017   BUN 17  12/02/2017   CREATININE 1.35 (H) 12/02/2017   CALCIUM 9.3 12/02/2017   MG 2.1 12/02/2017   No results found for: INR No results found for: CHOL, HDL, LDLCALC, TRIG   GEN- The patient is well appearing, alert and oriented x 3 today.  Head- normocephalic, atraumatic Eyes-  Sclera clear, conjunctiva pink Ears- hearing intact Oropharynx- clear Neck- supple, no JVP Lymph- no cervical lymphadenopathy Lungs- Clear to ausculation bilaterally, normal work of breathing Heart- irregular rate and rhythm, no murmurs, rubs or gallops, PMI not laterally displaced GI- soft, NT, ND, + BS Extremities- no clubbing, cyanosis, or edema MS- no significant deformity or atrophy Skin- no rash or lesion Psych- euthymic mood, full affect Neuro- strength and sensation are intact  EKG- SR with frequent  PAC's with first degree AV block, PR int 272 ms, qrs int 72 ms, qtc 454 ms (stable) Epic records reviewed Echo-Study Conclusions  - Left ventricle: The cavity size was normal. There was mild   concentric hypertrophy. Systolic function was mildly reduced. The   estimated ejection fraction was in the range of 45% to 50%.   Diffuse hypokinesis. - Aortic valve: Transvalvular velocity was within the normal range.   There was no stenosis. There was no regurgitation. - Mitral valve: Moderately calcified annulus. Calcification.   Transvalvular velocity was within the normal range. There was no   evidence for stenosis. There was trivial regurgitation. - Left atrium: The atrium was moderately dilated. - Right ventricle: The cavity size was normal. Wall thickness was   normal. Systolic function was normal. - Tricuspid valve: There was mild regurgitation. - Pulmonary arteries: Systolic pressure was within the normal   range. PA peak pressure: 18 mm Hg (S).  LHC-10/8-Ost 2nd Mrg to 2nd Mrg lesion is 30% stenosed.  Prox LAD to Mid LAD lesion is 100% stenosed.   1.  Total occlusion of the LAD just after the  first diagonal branch 2.  Minor nonobstructive disease involving the left main, left circumflex, and RCA  Recommendation: Consider CTO-PCI of the LAD.  There is a well-formed epicardial collateral from the obtuse marginal branch to the LAD and the length of the total occlusion appears to be fairly short.  Will refer to Dr Eldridge Dace or [place].    Assessment and Plan: 1. Persistent  afib One week s/p tikosyn load, left hospital in afib Appears to be in SR today with frequent  PAC's, pt is minimally improved from exertional dyspnea General precautions for Tikosyn discussed Has not missed any anticoagulation  Bmet/mag today  To f/u with Dr. Elberta Fortis in 3 weeks  Elvina Sidle. [facility] [address] [place], [place] [address] [phone]

## 2017-12-13 ENCOUNTER — Ambulatory Visit: Payer: Medicare Other | Admitting: Cardiology

## 2017-12-21 ENCOUNTER — Telehealth (HOSPITAL_COMMUNITY): Payer: Self-pay | Admitting: *Deleted

## 2017-12-21 NOTE — Telephone Encounter (Signed)
Patient potassium through PCP office on 11/25 was 4.6 on recheck. Pt to continue on kdur 20meq a day.

## 2017-12-27 ENCOUNTER — Telehealth (HOSPITAL_COMMUNITY): Payer: Self-pay | Admitting: *Deleted

## 2017-12-27 ENCOUNTER — Other Ambulatory Visit (HOSPITAL_COMMUNITY): Payer: Self-pay | Admitting: *Deleted

## 2017-12-27 MED ORDER — POTASSIUM CHLORIDE CRYS ER 20 MEQ PO TBCR: EXTENDED_RELEASE_TABLET | ORAL | 6 refills | Status: DC

## 2017-12-27 NOTE — Telephone Encounter (Signed)
Patient approved for eliquis assistance through 01/25/18. Application #[id number]

## 2018-01-03 ENCOUNTER — Encounter: Payer: Self-pay | Admitting: *Deleted

## 2018-01-03 ENCOUNTER — Ambulatory Visit: Payer: Medicare Other | Admitting: Cardiology

## 2018-01-07 ENCOUNTER — Encounter: Payer: Self-pay | Admitting: Cardiology

## 2018-01-07 ENCOUNTER — Ambulatory Visit (INDEPENDENT_AMBULATORY_CARE_PROVIDER_SITE_OTHER): Payer: Medicare Other | Admitting: Cardiology

## 2018-01-07 VITALS — BP 110/60 | HR 71 | Ht 64.0 in | Wt 192.8 lb

## 2018-01-07 DIAGNOSIS — I4819 Other persistent atrial fibrillation: Secondary | ICD-10-CM

## 2018-01-07 DIAGNOSIS — I2510 Atherosclerotic heart disease of native coronary artery without angina pectoris: Secondary | ICD-10-CM | POA: Diagnosis not present

## 2018-01-07 DIAGNOSIS — I25118 Atherosclerotic heart disease of native coronary artery with other forms of angina pectoris: Secondary | ICD-10-CM | POA: Diagnosis not present

## 2018-01-07 MED ORDER — METOPROLOL TARTRATE 25 MG PO TABS: 12.5000 mg | ORAL_TABLET | Freq: Two times a day (BID) | ORAL | 2 refills | Status: DC

## 2018-01-07 MED ORDER — ISOSORBIDE MONONITRATE ER 30 MG PO TB24: 15.0000 mg | ORAL_TABLET | Freq: Every day | ORAL | 1 refills | Status: DC

## 2018-01-07 MED ORDER — FUROSEMIDE 40 MG PO TABS: 40.0000 mg | ORAL_TABLET | ORAL | 2 refills | Status: DC

## 2018-01-07 NOTE — Addendum Note (Signed)
Addended by: Dory HornPRICE, SHERRI L on: 01/07/2018 01:00 PM   Modules accepted: Orders

## 2018-01-07 NOTE — Progress Notes (Addendum)
Electrophysiology Office Note   Date:  01/07/2018   ID:  Ronnie Torres, DOB January 16, 1930, MRN [id number]  PCP:  Jim Like, MD  Cardiologist:   Primary Electrophysiologist:  Ashunti Schofield Jorja Loa, MD    No chief complaint on file.    History of Present Illness: Ronnie Torres is a 84 y.o. male who is being seen today for the evaluation of atrial fibrillation at the request of Atha Starks. Presenting today for electrophysiology evaluation.  He has a history of paroxysmal atrial fibrillation on Eliquis and amiodarone, GERD, COPD, BPH, and intermittent lower extremity edema.  His main complaint today is of weakness and fatigue.  He says that he went into atrial fibrillation a few months ago, which is when the symptoms started.  He is also been more short of breath than usual.  He has tried amiodarone, but did not stay in normal rhythm.  He is also been having some chest pain.  He has had multiple episodes in the past.  He had a Myoview which was unremarkable.  He continues to have substernal chest pain that radiates to his neck.  He had coronary angiography on 11/02/2017.  This showed a proximal to mid LAD lesion that was 100% stenosed.  It was thought at that time due to his minimal symptoms that a CTO intervention was not necessary.  He was hospitalized on 11/29/2017 for dofetilide load.  He left the hospital in atrial fibrillation but still on dofetilide.  He followed up in A. fib clinic in sinus rhythm.  Today, denies symptoms of palpitations, chest pain, shortness of breath, orthopnea, PND, lower extremity edema, claudication, dizziness, presyncope, syncope, bleeding, or neurologic sequela. The patient is tolerating medications without difficulties.  He does continue to complain of chest pain.  He is noted no further episodes of atrial fibrillation.  His chest pain occurs at random times.  He did see GI to see if this could be esophageal stricture versus esophageal spasm, but no medications were  prescribed.  The pain is a pressure type sensation in the center to the right side of his chest.  No exacerbating or alleviating factors.  Past Medical History:  Diagnosis Date  . Acid reflux   . Arthritis   . COPD (chronic obstructive pulmonary disease) (HCC)   . Emphysema lung (HCC)   . Hiatal hernia   . Sleep apnea    Past Surgical History:  Procedure Laterality Date  . CARDIOVERSION N/A 12/01/2017   Procedure: CARDIOVERSION;  Surgeon: Jake Bathe, MD;  Location: [facility] ENDOSCOPY;  Service: Cardiovascular;  Laterality: N/A;  . CORONARY ANGIOGRAPHY N/A 11/02/2017   Procedure: CORONARY ANGIOGRAPHY (CATH LAB);  Surgeon: Tonny Bollman, MD;  Location: [facility] INVASIVE CV LAB;  Service: Cardiovascular;  Laterality: N/A;     Current Outpatient Medications  Medication Sig Dispense Refill  . albuterol (PROVENTIL HFA;VENTOLIN HFA) 108 (90 Base) MCG/ACT inhaler Inhale 2 puffs into the lungs every 4 (four) hours as needed for wheezing or shortness of breath.     Marland Kitchen apixaban (ELIQUIS) 5 MG TABS tablet Take 1 tablet (5 mg total) by mouth 2 (two) times daily. 60 tablet 0  . budesonide-formoterol (SYMBICORT) 160-4.5 MCG/ACT inhaler Inhale 2 puffs into the lungs 2 (two) times daily.    Marland Kitchen dofetilide (TIKOSYN) 250 MCG capsule Take 1 capsule (250 mcg total) by mouth 2 (two) times daily. 180 capsule 1  . furosemide (LASIX) 40 MG tablet Take 40 mg by mouth every morning.   1  . gabapentin (  NEURONTIN) 100 MG capsule Take 100 mg by mouth 2 (two) times daily.     . Menthol, Topical Analgesic, (STOPAIN) 8 % LIQD Apply 1 application topically daily as needed (joint pain).    . metoprolol tartrate (LOPRESSOR) 25 MG tablet Take 12.5 mg by mouth 2 (two) times daily.     . midodrine (PROAMATINE) 2.5 MG tablet Take 2.5 mg by mouth 2 (two) times daily.     Marland Kitchen. omeprazole (PRILOSEC) 20 MG capsule Take 20 mg by mouth 2 (two) times daily.     . potassium chloride SA (K-DUR,KLOR-CON) 20 MEQ tablet Take 1 tablet twice a day for  3 days then reduce to 1 tablet a day 30 tablet 6  . senna-docusate (SENOKOT S) 8.6-50 MG tablet Take 1 tablet by mouth at bedtime as needed for mild constipation.    . sertraline (ZOLOFT) 25 MG tablet Take 25 mg by mouth at bedtime.     . tamsulosin (FLOMAX) 0.4 MG CAPS capsule Take 0.4 mg by mouth 2 (two) times daily.     Marland Kitchen. triamcinolone cream (KENALOG) 0.1 % Apply 1 application topically daily as needed (rash on arms).    . isosorbide mononitrate (IMDUR) 30 MG 24 hr tablet Take 0.5 tablets (15 mg total) by mouth daily. 90 tablet 1   No current facility-administered medications for this visit.     Allergies:   Shellfish allergy; Tape; Sulfa antibiotics; and Trazodone and nefazodone   Social History:  The patient  reports that he quit smoking about 44 years ago. He has never used smokeless tobacco. He reports that he does not drink alcohol or use drugs.   Family History:  The patient's family history includes Atrial fibrillation in his brother; CVA in his father; Cancer in his brother and daughter; Dementia in his sister and sister; Other in his mother; Pancreatic cancer in his brother.   ROS:  Please see the history of present illness.   Otherwise, review of systems is positive for chest pain.   All other systems are reviewed and negative.   PHYSICAL EXAM: VS:  BP 110/60   Pulse 71   Ht 5\' 4"  (1.626 m)   Wt 192 lb 12.8 oz (87.5 kg)   SpO2 98%   BMI 33.09 kg/m  , BMI Body mass index is 33.09 kg/m. GEN: Well nourished, well developed, in no acute distress  HEENT: normal  Neck: no JVD, carotid bruits, or masses Cardiac: RRR; no murmurs, rubs, or gallops,no edema  Respiratory:  clear to auscultation bilaterally, normal work of breathing GI: soft, nontender, nondistended, + BS MS: no deformity or atrophy  Skin: warm and dry Neuro:  Strength and sensation are intact Psych: euthymic mood, full affect  EKG:  EKG is ordered today. Personal review of the ekg ordered shows sinus rhythm,  PACs, PVCs, rate 71  Recent Labs: 10/25/2017: Hemoglobin 13.8; Platelets 128 12/10/2017: BUN 19; Creatinine, Ser 1.24; Magnesium 2.0; Potassium 3.6; Sodium 139    Lipid Panel  No results found for: CHOL, TRIG, HDL, CHOLHDL, VLDL, LDLCALC, LDLDIRECT   Wt Readings from Last 3 Encounters:  01/07/18 192 lb 12.8 oz (87.5 kg)  12/10/17 188 lb 3.2 oz (85.4 kg)  12/02/17 184 lb 9.6 oz (83.7 kg)      Other studies Reviewed: Additional studies/ records that were reviewed today include: Myoview 06/29/2017 Review of the above records today demonstrates:  No inducible ischemia No wall motion abnormalities Normal left ventricular function  TTE 10/01/17 - Left ventricle: The  cavity size was normal. There was mild   concentric hypertrophy. Systolic function was mildly reduced. The   estimated ejection fraction was in the range of 45% to 50%.   Diffuse hypokinesis. - Aortic valve: Transvalvular velocity was within the normal range.   There was no stenosis. There was no regurgitation. - Mitral valve: Moderately calcified annulus. Calcification.   Transvalvular velocity was within the normal range. There was no   evidence for stenosis. There was trivial regurgitation. - Left atrium: The atrium was moderately dilated. - Right ventricle: The cavity size was normal. Wall thickness was   normal. Systolic function was normal. - Tricuspid valve: There was mild regurgitation. - Pulmonary arteries: Systolic pressure was within the normal   range. PA peak pressure: 18 mm Hg (S).  LHC 11/02/17  Ost 2nd Mrg to 2nd Mrg lesion is 30% stenosed.  Prox LAD to Mid LAD lesion is 100% stenosed.   1.  Total occlusion of the LAD just after the first diagonal branch 2.  Minor nonobstructive disease involving the left main, left circumflex, and RCA   ASSESSMENT AND PLAN:  1.  Persistent atrial fibrillation: Has a moderately dilated left atrium.  Was admitted for dofetilide load on 11/29/2017.  He unfortunately  of the hospital in atrial fibrillation, but was discharged on dofetilide.  He showed up in A. fib clinic in sinus rhythm.  Feeling well without further episodes of atrial fibrillation.  This patients CHA2DS2-VASc Score and unadjusted Ischemic Stroke Rate (% per year) is equal to 3.2 % stroke rate/year from a score of 3  Above score calculated as 1 point each if present [CHF, HTN, DM, Vascular=MI/PAD/Aortic Plaque, Age if 65-74, or Male] Above score calculated as 2 points each if present [Age > 75, or Stroke/TIA/TE]  The patient is currently on dofetilide because he cannot tolerate other medications.  Rhythmol and flecainide would be a poor option as he does have coronary artery disease.  He does have baseline bradycardia and thus cannot prescribe sotalol.  Quinidine is generally used for ventricular arrhythmias.  Quinine is is not an antiarrhythmic.  Dofetilide was his only option.  2.  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  He does have a chronically occluded LAD.  It is unclear whether this could be reflux, esophageal spasm, or his coronary disease.  Due to that, we Aren Cherne plan for 15 mg of Imdur.  3.  Lower extremity edema: Normal on exam today.  No changes.  Current medicines are reviewed at length with the patient today.   The patient does not have concerns regarding his medicines.  The following changes were made today: Start Imdur  Labs/ tests ordered today include:  Orders Placed This Encounter  Procedures  . EKG 12-Lead     Disposition:   FU with Leoncio Hansen 3 months  Signed, Kionna Brier Jorja Loa, MD  01/07/2018 12:08 PM     [facility] [address] [place] [address] [phone] (office) [phone] (fax)

## 2018-01-07 NOTE — Patient Instructions (Signed)
Medication Instructions:  Your physician has recommended you make the following change in your medication:  1. START Imdur 15 mg once daily  * If you need a refill on your cardiac medications before your next appointment, please call your pharmacy.   Labwork: None ordered  Testing/Procedures: None ordered  Follow-Up: Your physician recommends that you schedule a follow-up appointment in: 3 months with Dr. Elberta Fortis.   Thank you for choosing [facility]!!   Dory Horn, RN [phone]  Any Other Special Instructions Will Be Listed Below (If Applicable).  Isosorbide Mononitrate extended-release tablets What is this medicine? ISOSORBIDE MONONITRATE (eye soe SOR bide mon oh NYE trate) is a vasodilator. It relaxes blood vessels, increasing the blood and oxygen supply to your heart. This medicine is used to prevent chest pain caused by angina. It will not help to stop an episode of chest pain. This medicine may be used for other purposes; ask your health care provider or pharmacist if you have questions. COMMON BRAND NAME(S): Imdur, Isotrate ER What should I tell my health care provider before I take this medicine? They need to know if you have any of these conditions: -previous heart attack or heart failure -an unusual or allergic reaction to isosorbide mononitrate, nitrates, other medicines, foods, dyes, or preservatives -pregnant or trying to get pregnant -breast-feeding How should I use this medicine? Take this medicine by mouth with a glass of water. Follow the directions on the prescription label. Do not crush or chew. Take your medicine at regular intervals. Do not take your medicine more often than directed. Do not stop taking this medicine except on the advice of your doctor or health care professional. Talk to your pediatrician regarding the use of this medicine in children. Special care may be needed. Overdosage: If you think you have taken too much of this medicine  contact a poison control center or emergency room at once. NOTE: This medicine is only for you. Do not share this medicine with others. What if I miss a dose? If you miss a dose, take it as soon as you can. If it is almost time for your next dose, take only that dose. Do not take double or extra doses. What may interact with this medicine? Do not take this medicine with any of the following medications: -medicines used to treat erectile dysfunction (ED) like avanafil, sildenafil, tadalafil, and vardenafil -riociguat This medicine may also interact with the following medications: -medicines for high blood pressure -other medicines for angina or heart failure This list may not describe all possible interactions. Give your health care provider a list of all the medicines, herbs, non-prescription drugs, or dietary supplements you use. Also tell them if you smoke, drink alcohol, or use illegal drugs. Some items may interact with your medicine. What should I watch for while using this medicine? Check your heart rate and blood pressure regularly while you are taking this medicine. Ask your doctor or health care professional what your heart rate and blood pressure should be and when you should contact him or her. Tell your doctor or health care professional if you feel your medicine is no longer working. You may get dizzy. Do not drive, use machinery, or do anything that needs mental alertness until you know how this medicine affects you. To reduce the risk of dizzy or fainting spells, do not sit or stand up quickly, especially if you are an older patient. Alcohol can make you more dizzy, and increase flushing and rapid heartbeats.  Avoid alcoholic drinks. Do not treat yourself for coughs, colds, or pain while you are taking this medicine without asking your doctor or health care professional for advice. Some ingredients may increase your blood pressure. What side effects may I notice from receiving this  medicine? Side effects that you should report to your doctor or health care professional as soon as possible: -bluish discoloration of lips, fingernails, or palms of hands -irregular heartbeat, palpitations -low blood pressure -nausea, vomiting -persistent headache -unusually weak or tired Side effects that usually do not require medical attention (report to your doctor or health care professional if they continue or are bothersome): -flushing of the face or neck -rash This list may not describe all possible side effects. Call your doctor for medical advice about side effects. You may report side effects to FDA at 1-800-FDA-1088. Where should I keep my medicine? Keep out of the reach of children. Store between 15 and 30 degrees C (59 and 86 degrees F). Keep container tightly closed. Throw away any unused medicine after the expiration date. NOTE: This sheet is a summary. It may not cover all possible information. If you have questions about this medicine, talk to your doctor, pharmacist, or health care provider.  2018 Elsevier/Gold Standard (2012-11-11 14:48:19)

## 2018-01-28 ENCOUNTER — Other Ambulatory Visit: Payer: Self-pay | Admitting: Cardiology

## 2018-01-28 MED ORDER — POTASSIUM CHLORIDE CRYS ER 20 MEQ PO TBCR: 20.0000 meq | EXTENDED_RELEASE_TABLET | Freq: Every day | ORAL | 3 refills | Status: DC

## 2018-01-28 MED ORDER — DOFETILIDE 250 MCG PO CAPS: 250.0000 ug | ORAL_CAPSULE | Freq: Two times a day (BID) | ORAL | 3 refills | Status: DC

## 2018-01-28 NOTE — Telephone Encounter (Signed)
Pt is requesting for their prescription to be sent to [facility] for the potassium. Please address, thank you.

## 2018-01-28 NOTE — Telephone Encounter (Signed)
New Message     *STAT* If patient is at the pharmacy, call can be transferred to refill team.   1. Which medications need to be refilled? (please list name of each medication and dose if known) Tikosyn 250mg  and Potassium 20mg   2. Which pharmacy/location (including street and city if local pharmacy) is medication to be sent to? [facility]- [phone]  3. Do they need a 30 day or 90 day supply? 90 day supply  Reference #for discount- [id number] Prior auth- [id number]

## 2018-01-31 ENCOUNTER — Telehealth: Payer: Self-pay

## 2018-01-31 NOTE — Telephone Encounter (Signed)
Follow Up:     Daughter calling , concerning pt's Tikosyn.

## 2018-01-31 NOTE — Telephone Encounter (Signed)
**Note De-Identified Taren Dymek Obfuscation** The pts daughter is advised that I did a PA this morning over the phone with [facility] and completed a tier exception form that was faxed to [place] by [facility] . She verbalized understanding and thanked mr for calling her back.

## 2018-01-31 NOTE — Telephone Encounter (Signed)
I called [facility] at [phone] and did a Dofetilide PA over the phone with Jola Baptist. Per Anola Gurney [facility] will fax [place] the determination when available. Case# [id number]

## 2018-01-31 NOTE — Telephone Encounter (Signed)
Tier Cost Sharing Exception Request Form received via fax from [facility] concerning the pts Dofetilide. I have completed the form and fax it back to [facility].

## 2018-01-31 NOTE — Telephone Encounter (Signed)
-----   Message from Baird Lyons, RN sent at 01/28/2018  6:54 PM EST ----- Regarding: [place] tier reduction Pt Tikosyn is currently tier 3. Asking if we can get the tier lowered.  Thx

## 2018-02-02 NOTE — Telephone Encounter (Signed)
**Note De-Identified  Obfuscation** I have completed another form that was faxed to [place] from [facility] concerning this tier exception for Dofetilide.  Per the form it is unlikely that a tier exception will be granted for this request because the pt must try and fail Quinine, Propafenone and Sotalol.  Awaiting decision.

## 2018-02-04 NOTE — Telephone Encounter (Signed)
New message   Patient's daughter is calling with questions about prior authorization per the previous message. Please call.

## 2018-02-04 NOTE — Telephone Encounter (Signed)
We received notification that Dofetilide coverage was denied. I spoke with Dory Horn,  Dr Elberta Fortis Nurse and she advised me to contact the Pt's Daughter and let her know that she would speak to Dr Elberta Fortis about this on Monday and let her know what he suggest.   She stated that he only has 1 week of medicine left and would like to know asap so she can order the medicine.

## 2018-02-07 NOTE — Telephone Encounter (Signed)
Spoke to dtr.   She understands we are working on tier exception. Pt has medication on hand at home.

## 2018-02-11 NOTE — Telephone Encounter (Signed)
Letter received from [facility] stating that Tikosyn is already at a tier 3 and per Medicare rules once a name brand medication has been lowered to a tier 3 it cannot be lowered any further.  I will forward this message to Dr Elberta Fortis and his nurse for advisement to the pt.

## 2018-02-15 ENCOUNTER — Telehealth: Payer: Self-pay

## 2018-02-15 NOTE — Telephone Encounter (Signed)
Letter received from [facility] stating that the pts Tikosyn tier exception was denied because the pt needs to try and fail Quinine, Propafenone, and Sotalol.  Dr Elberta Fortis has noted on each of these medications in his OV note from 01/07/2018 so we can appeal this denial.  I called [facility] and s/w Chrissy who states that she is faxing [place] an appeal form so we can appeal this denial on the pts Tikosyn.  Awaiting form.

## 2018-02-18 NOTE — Telephone Encounter (Signed)
**Note De-Identified Ercole Georg Obfuscation** Form never received so I called [facility] and did an Urgent appeal on the pts Dofetilide PA over the phone with Debbie. Per Eunice Blase we should have a determination from them within 72 hours.

## 2018-02-22 NOTE — Telephone Encounter (Signed)
Letter received from [facility] stating that they have denied this Tikosyn/Dofetilide tier exception. It states that the pt must first try all covered generic or brand name drugs used to treat the pts condition in the lower tier 2.  Tier 2 drugs are: Quinine Propafenone Sotalol  I have completed the appeal form that was attached to this denial letter anf included Dr Gershon Crane notes from OV on 01/07/2018.

## 2018-02-23 NOTE — Telephone Encounter (Signed)
I will route this message to Dr. Elberta Fortis nurse Dory Horn, RN as Larita Fife Via, LPN is out of the office today. See notes from [facility] Via, LPN.

## 2018-02-23 NOTE — Telephone Encounter (Signed)
Follow up   Pt daughter wants to know any updates on Tikosyn/Dofetilide

## 2018-02-23 NOTE — Telephone Encounter (Signed)
Spoke to Lisa.  Informed that I would look into this further and discuss w/ Lynn.   She is aware I will call her by the end of the week 

## 2018-02-23 NOTE — Telephone Encounter (Signed)
I will route this message to Dr.

## 2018-02-23 NOTE — Telephone Encounter (Signed)
Spoke to [place].  Informed that I would look into this further and discuss w/ Larita Fife.   She is aware I will call her by the end of the week

## 2018-02-25 NOTE — Telephone Encounter (Signed)
Informed dtr of options at this time.   Pt is going to continue Tikosyn for now.  They will call the office if we need to re-evaluate medication due to cost. Dtr will call us if the time comes to re-address. She appreciates all we have done concerning this situation.

## 2018-02-28 MED ORDER — DOFETILIDE 250 MCG PO CAPS: 250.0000 ug | ORAL_CAPSULE | Freq: Two times a day (BID) | ORAL | 3 refills | Status: DC

## 2018-02-28 NOTE — Addendum Note (Signed)
Addended by: Baird Lyons on: 02/28/2018 05:27 PM   Modules accepted: Orders

## 2018-02-28 NOTE — Telephone Encounter (Signed)
Ronnie Torres, would like for nurse to give her a call back to discuss some information she has found out about the medication.

## 2018-02-28 NOTE — Telephone Encounter (Signed)
Called to inform Ronnie Torres that insurance decreased Tikosyn to a Tier 1.

## 2018-03-02 NOTE — Telephone Encounter (Signed)
The pts daughter states that the pt received a letter from [facility] stating that her Dofetilide has been lowered to a tier 1. I have not received a decision on the pts Dofetilide appeal that I did weeks ago.  She states that the pts pharmacy, [facility] is telling her that we are sending in her Dofelide RX in as name brand Tikosyn but according to the pts chart we sent in the refill as generic Dofelide.  She is requesting that I call [facility] at [phone] to straighten out. I have advised her that I will try but that we never received an approval for the pts Tikosyn or Dofelide so I may not be successful.

## 2018-03-02 NOTE — Telephone Encounter (Addendum)
I called [facility] specialty pharmacy at [phone] and s/w [facility] concerning the pts dofetilide. Per Corrie Dandy the appeal on the Dofetilide that was submitted on 02/20/2018 was approved and that now the pts Dofetilide is at a tier 3.  Corrie Dandy states that the approval is good until 01/26/2019  [facility] transferred my call to [place] at [facility] pharmacy who advised me that the pts Dofelide was lowered to a tier 3 and that the cost to the pt is now $117.23 for a 90 day supply.  I have called the pts daughter and made her aware.

## 2018-03-02 NOTE — Telephone Encounter (Addendum)
Patient's daughter called today stating the insurance company is stating the [employer] code is not matching, it comes up as the brand name so they won't lower the tier. They gave her this number as a code [id number].

## 2018-04-14 ENCOUNTER — Telehealth: Payer: Self-pay | Admitting: Cardiology

## 2018-04-14 NOTE — Telephone Encounter (Signed)
Patient's daughter [name] called stating she spoke to [facility] yesterday, and her father potassium is all missed up, she is wondering if Dr. Elberta Fortis can write an order to have his potassium checked.  If the order can be faxed to his PCP's office at [phone].

## 2018-04-14 NOTE — Telephone Encounter (Signed)
I spoke to the patient's daughter who is concerned about the patient's potassium level.  She is going to try and get his potassium drawn at his PCP this afternoon or at his OV with Dr Elberta Fortis 3/20.

## 2018-04-15 ENCOUNTER — Ambulatory Visit: Payer: Medicare Other | Admitting: Cardiology

## 2018-04-15 NOTE — Telephone Encounter (Signed)
Follow up   Patient's daughter states that lab results will be faxed to office the patient went to PCP today. Please call if you have questions.

## 2018-04-15 NOTE — Telephone Encounter (Signed)
Dtr tells me that she has not spoken with PCP yet to find out what result was. Dtr will call me back once she knows what result is.

## 2018-04-22 ENCOUNTER — Telehealth: Payer: Self-pay | Admitting: Cardiology

## 2018-04-22 NOTE — Telephone Encounter (Signed)
New Message:    Daughter says pt 's hemoglobin is down to 9.2. His doctor wants him to stop his Eliquis.  Daughter is very concerned about him stopping his Eliquis, she wants to discuss this with Dr Elberta Fortis or the nurse please.

## 2018-04-25 ENCOUNTER — Telehealth: Payer: Self-pay | Admitting: Cardiology

## 2018-04-25 NOTE — Telephone Encounter (Signed)
New Message:   Patient daughter calling about her father. Please call patient daughter back.

## 2018-04-25 NOTE — Telephone Encounter (Signed)
Dtr calling to let us know that GI doctor told pt to restart Eliquis.

## 2018-07-11 ENCOUNTER — Other Ambulatory Visit: Payer: Self-pay | Admitting: Cardiology

## 2018-07-11 MED ORDER — DOFETILIDE 250 MCG PO CAPS: 250.0000 ug | ORAL_CAPSULE | Freq: Two times a day (BID) | ORAL | 1 refills | Status: DC

## 2018-07-11 NOTE — Telephone Encounter (Signed)
New Message      *STAT* If patient is at the pharmacy, call can be transferred to refill team.   1. Which medications need to be refilled? (please list name of each medication and dose if known) Tikosyn   2. Which pharmacy/location (including street and city if local pharmacy) is medication to be sent to? [facility] [address]   3. Do they need a 30 day or 90 day supply? [place]

## 2018-07-11 NOTE — Telephone Encounter (Signed)
Pt's medication was sent to pt's pharmacy as requested. Confirmation received.  °

## 2018-08-29 ENCOUNTER — Other Ambulatory Visit: Payer: Self-pay | Admitting: Cardiology

## 2018-08-30 ENCOUNTER — Telehealth (HOSPITAL_COMMUNITY): Payer: Self-pay | Admitting: *Deleted

## 2018-08-30 NOTE — Telephone Encounter (Signed)
Patient approved for eliquis assistance 08/29/18-01/26/19. Application #[id number]

## 2018-09-13 ENCOUNTER — Telehealth: Payer: Self-pay | Admitting: Cardiology

## 2018-09-13 NOTE — Telephone Encounter (Signed)
New message    Patient's daughter calling to report irregular heart "feeling".  Daughter wants to discuss changing isosorbide mononitrate (IMDUR) Offered appointment, Only wants appointment on a Monday or Friday   1) How long have you had palpitations/irregular HR/ Afib? Are you having the symptoms now?  Daughter unsure  2) Are you currently experiencing lightheadedness, SOB or CP? no  Do you have a history of afib (atrial fibrillation) or irregular heart rhythm? yes 3) Have you checked your BP or HR? (document readings if available): n/a  4) Are you experiencing any other symptoms? no

## 2018-09-13 NOTE — Telephone Encounter (Signed)
LMTCB

## 2018-09-14 NOTE — Telephone Encounter (Signed)
Dtr reports pt just told her he is having more skipped beats and he is overdue for follow up. Scheduled pt for next Friday to see Dr. Curt Bears dtr agreeable to plan.

## 2018-09-22 ENCOUNTER — Telehealth: Payer: Self-pay | Admitting: Cardiology

## 2018-09-22 NOTE — Telephone Encounter (Signed)
dtr informed ok to come up w/ pt. Added to appt notes. dtr appreciates my follow up call

## 2018-09-22 NOTE — Telephone Encounter (Signed)
New Message   Patient's daughter is calling in to get clearance to accompany the patient in his appointment on tomorrow 09/23/18 at 4:15pm with Dr Curt Bears. States that patient has a hard time hearing and she needs to accompany the patient to assist. Please give patient's daughter a call back to confirm.

## 2018-09-23 ENCOUNTER — Encounter: Payer: Self-pay | Admitting: Cardiology

## 2018-09-23 ENCOUNTER — Other Ambulatory Visit: Payer: Self-pay

## 2018-09-23 ENCOUNTER — Ambulatory Visit (INDEPENDENT_AMBULATORY_CARE_PROVIDER_SITE_OTHER): Payer: Medicare Other | Admitting: Cardiology

## 2018-09-23 VITALS — BP 100/64 | HR 71 | Ht 64.0 in | Wt 182.0 lb

## 2018-09-23 DIAGNOSIS — I4819 Other persistent atrial fibrillation: Secondary | ICD-10-CM | POA: Diagnosis not present

## 2018-09-23 MED ORDER — METOPROLOL TARTRATE 25 MG PO TABS: 25.0000 mg | ORAL_TABLET | Freq: Two times a day (BID) | ORAL | 6 refills | Status: DC

## 2018-09-23 MED ORDER — METOPROLOL TARTRATE 25 MG PO TABS: 37.5000 mg | ORAL_TABLET | Freq: Two times a day (BID) | ORAL | 6 refills | Status: DC

## 2018-09-23 NOTE — Patient Instructions (Addendum)
Medication Instructions:  Your physician has recommended you make the following change in your medication:  1. INCREASE Metoprolol to 25 mg twice a day  * If you need a refill on your cardiac medications before your next appointment, please call your pharmacy.   Labwork: None ordered  Testing/Procedures: None ordered  Follow-Up: Your physician wants you to follow-up in: 6 months with Dr. Curt Bears.  You will receive a reminder letter in the mail two months in advance. If you don't receive a letter, please call our office to schedule the follow-up appointment.  Thank you for choosing [facility]!!   Trinidad Curet, RN [phone]

## 2018-09-23 NOTE — Progress Notes (Signed)
Electrophysiology Office Note   Date:  09/23/2018   ID:  Ronnie Torres, Park MeoDOB 1929-12-18, MRN [id number]  PCP:  Jim LikeWilson, Benjamin, MD  Cardiologist:   Primary Electrophysiologist:  Minie Roadcap Jorja LoaMartin Taiga Lupinacci, MD    No chief complaint on file.    History of Present Illness: Ronnie LaurenceBobby Torres is a 85 y.o. male who is being seen today for the evaluation of atrial fibrillation at the request of [name]. Presenting today for electrophysiology evaluation.  He has a history of paroxysmal atrial fibrillation on Eliquis and amiodarone, GERD, COPD, BPH, and intermittent lower extremity edema.  His main complaint today is of weakness and fatigue.  He says that he went into atrial fibrillation a few months ago, which is when the symptoms started.  He is also been more short of breath than usual.  He has tried amiodarone, but did not stay in normal rhythm.  He is also been having some chest pain.  He has had multiple episodes in the past.  He had a Myoview which was unremarkable.  He continues to have substernal chest pain that radiates to his neck.  He had coronary angiography on 11/02/2017.  This showed a proximal to mid LAD lesion that was 100% stenosed.  It was thought at that time due to his minimal symptoms that a CTO intervention was not necessary.  He was hospitalized on 11/29/2017 for dofetilide load.  He left the hospital in atrial fibrillation but still on dofetilide.  He followed up in A. fib clinic in sinus rhythm.  Today, denies symptoms of palpitations, chest pain, shortness of breath, orthopnea, PND, lower extremity edema, claudication, dizziness, presyncope, syncope, bleeding, or neurologic sequela. The patient is tolerating medications without difficulties.  He has noted no further episodes of atrial fibrillation.  He is having a few episodic palpitations that make him take a deep breath.  These correlate with PVCs on his ECG.  Past Medical History:  Diagnosis Date  . Acid reflux   . Arthritis   . COPD  (chronic obstructive pulmonary disease) (HCC)   . Emphysema lung (HCC)   . Hiatal hernia   . Sleep apnea    Past Surgical History:  Procedure Laterality Date  . CARDIOVERSION N/A 12/01/2017   Procedure: CARDIOVERSION;  Surgeon: Jake BatheSkains, Mark C, MD;  [facility];  Service: Cardiovascular;  Laterality: N/A;  . CORONARY ANGIOGRAPHY N/A 11/02/2017   Procedure: CORONARY ANGIOGRAPHY (CATH LAB);  Surgeon: Tonny Bollmanooper, Michael, MD;  [facility];  Service: Cardiovascular;  Laterality: N/A;     Current Outpatient Medications  Medication Sig Dispense Refill  . albuterol (PROVENTIL HFA;VENTOLIN HFA) 108 (90 Base) MCG/ACT inhaler Inhale 2 puffs into the lungs every 4 (four) hours as needed for wheezing or shortness of breath.     Marland Kitchen. apixaban (ELIQUIS) 5 MG TABS tablet Take 1 tablet (5 mg total) by mouth 2 (two) times daily. 60 tablet 0  . budesonide-formoterol (SYMBICORT) 160-4.5 MCG/ACT inhaler Inhale 2 puffs into the lungs 2 (two) times daily.    Marland Kitchen. dofetilide (TIKOSYN) 250 MCG capsule Take 1 capsule (250 mcg total) by mouth 2 (two) times daily. 180 capsule 1  . ferrous sulfate 324 (65 Fe) MG TBEC Take 1 tablet by mouth daily.    . furosemide (LASIX) 40 MG tablet Take 1 tablet (40 mg total) by mouth every morning. 90 tablet 2  . gabapentin (NEURONTIN) 100 MG capsule Take 100 mg by mouth 2 (two) times daily.     . Menthol,  Topical Analgesic, (STOPAIN) 8 % LIQD Apply 1 application topically daily as needed (joint pain).    . metoprolol tartrate (LOPRESSOR) 25 MG tablet TAKE ONE-HALF TABLET BY  MOUTH TWO TIMES DAILY 90 tablet 0  . midodrine (PROAMATINE) 2.5 MG tablet Take 2.5 mg by mouth 2 (two) times daily.     Marland Kitchen omeprazole (PRILOSEC) 20 MG capsule Take 20 mg by mouth 2 (two) times daily.     . potassium chloride SA (K-DUR,KLOR-CON) 20 MEQ tablet Take 1 tablet (20 mEq total) by mouth daily. 90 tablet 3  . senna-docusate (SENOKOT S) 8.6-50 MG tablet Take 1 tablet by mouth at bedtime  as needed for mild constipation.    . sertraline (ZOLOFT) 25 MG tablet Take 25 mg by mouth at bedtime.     . tamsulosin (FLOMAX) 0.4 MG CAPS capsule Take 0.4 mg by mouth 2 (two) times daily.     Marland Kitchen triamcinolone cream (KENALOG) 0.1 % Apply 1 application topically daily as needed (rash on arms).    . isosorbide mononitrate (IMDUR) 30 MG 24 hr tablet Take 0.5 tablets (15 mg total) by mouth daily. 90 tablet 1   No current facility-administered medications for this visit.     Allergies:   Shellfish allergy, Tape, Sulfa antibiotics, and Trazodone and nefazodone   Social History:  The patient  reports that he quit smoking about 45 years ago. He has never used smokeless tobacco. He reports that he does not drink alcohol or use drugs.   Family History:  The patient's family history includes Atrial fibrillation in his brother; CVA in his father; Cancer in his brother and daughter; Dementia in his sister and sister; Other in his mother; Pancreatic cancer in his brother.   ROS:  Please see the history of present illness.   Otherwise, review of systems is positive for none.   All other systems are reviewed and negative.   PHYSICAL EXAM: VS:  BP 100/64   Pulse 71   Ht 5\' 4"  (1.626 m)   Wt 182 lb (82.6 kg)   SpO2 97%   BMI 31.24 kg/m  , BMI Body mass index is 31.24 kg/m. GEN: Well nourished, well developed, in no acute distress  HEENT: normal  Neck: no JVD, carotid bruits, or masses Cardiac: RRR; no murmurs, rubs, or gallops,no edema  Respiratory:  clear to auscultation bilaterally, normal work of breathing GI: soft, nontender, nondistended, + BS MS: no deformity or atrophy  Skin: warm and dry Neuro:  Strength and sensation are intact Psych: euthymic mood, full affect  EKG:  EKG is ordered today. Personal review of the ekg ordered shows this rhythm, first-degree AV block, rate 71, PVC  Recent Labs: 10/25/2017: Hemoglobin 13.8; Platelets 128 12/10/2017: BUN 19; Creatinine, Ser 1.24;  Magnesium 2.0; Potassium 3.6; Sodium 139    Lipid Panel  No results found for: CHOL, TRIG, HDL, CHOLHDL, VLDL, LDLCALC, LDLDIRECT   Wt Readings from Last 3 Encounters:  09/23/18 182 lb (82.6 kg)  01/07/18 192 lb 12.8 oz (87.5 kg)  12/10/17 188 lb 3.2 oz (85.4 kg)      Other studies Reviewed: Additional studies/ records that were reviewed today include: Myoview 06/29/2017 Review of the above records today demonstrates:  No inducible ischemia No wall motion abnormalities Normal left ventricular function  TTE 10/01/17 - Left ventricle: The cavity size was normal. There was mild   concentric hypertrophy. Systolic function was mildly reduced. The   estimated ejection fraction was in the range of 45% to  50%.   Diffuse hypokinesis. - Aortic valve: Transvalvular velocity was within the normal range.   There was no stenosis. There was no regurgitation. - Mitral valve: Moderately calcified annulus. Calcification.   Transvalvular velocity was within the normal range. There was no   evidence for stenosis. There was trivial regurgitation. - Left atrium: The atrium was moderately dilated. - Right ventricle: The cavity size was normal. Wall thickness was   normal. Systolic function was normal. - Tricuspid valve: There was mild regurgitation. - Pulmonary arteries: Systolic pressure was within the normal   range. PA peak pressure: 18 mm Hg (S).  LHC 11/02/17  Ost 2nd Mrg to 2nd Mrg lesion is 30% stenosed.  Prox LAD to Mid LAD lesion is 100% stenosed.   1.  Total occlusion of the LAD just after the first diagonal branch 2.  Minor nonobstructive disease involving the left main, left circumflex, and RCA   ASSESSMENT AND PLAN:  1.  Persistent atrial fibrillation: Currently on Eliquis and dofetilide.  He is in sinus rhythm.  He is having episodes of shortness of breath that sound to be due to PVCs.  We Rebekah Sprinkle therefore increase metoprolol to 37.5.  This patients CHA2DS2-VASc Score and  unadjusted Ischemic Stroke Rate (% per year) is equal to 3.2 % stroke rate/year from a score of 3  Above score calculated as 1 point each if present [CHF, HTN, DM, Vascular=MI/PAD/Aortic Plaque, Age if 65-74, or Male] Above score calculated as 2 points each if present [Age > 75, or Stroke/TIA/TE]   2.  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  Has chronic stable angina.  Has recently started Imdur.  3.  Lower extremity edema: Currently improved  Current medicines are reviewed at length with the patient today.   The patient does not have concerns regarding his medicines.  The following changes were made today: Increase metoprolol to 37.5.  Labs/ tests ordered today include:  Orders Placed This Encounter  Procedures  . EKG 12-Lead     Disposition:   FU with Carmen Tolliver 6 months  Signed, Leonides Minder Jorja Loa, MD  09/23/2018 3:33 PM     [facility] [address] [place] [address] [phone] (office) [phone] (fax)

## 2018-10-06 ENCOUNTER — Telehealth: Payer: Self-pay | Admitting: Cardiology

## 2018-10-06 NOTE — Telephone Encounter (Signed)
Pts daughter called to report that the pt was in the [place] recently with a fractured cervical spine from a fall not from a syncopal episode... at [facility].. the pt and his family were very unhappy with the care they received while he was there.   He had a neck CT/ MRI and they coincidently found the pt to have carotid artery stenosis. They have since advised him to take ASA along with his Eliquis. They feel uncomfortable with this since he has a significant history of GI bleeding.... he always has positive blood in his stools and they believe from his lower GI track.   His daughter, a nurse at [employer] [facility] [employer] is asking if Dr. Curt Bears would help her and order a carotid artery ultrasound for a more definitve diagnosis and let her know if the pt really needs the added blood thinner, ASA.   Will forward to Dr. Laroy Apple for review and recommendations.

## 2018-10-06 NOTE — Telephone Encounter (Signed)
  Daughter is calling because patient had a bad fall on 09/30/18 and he has a C2 fracture. He was discharged yesterday from [facility]. They were told at [facility] that he has some carotid stenosis. [facility] told him to start taking aspirin along with his Eliquis but Ms Ysidro Evert wants to make sure that it is safe for him to take the two together. Please advise.

## 2018-10-07 NOTE — Telephone Encounter (Signed)
Lets get the neck scan reports and if not revealing, would order carotid doppler.

## 2018-10-07 NOTE — Telephone Encounter (Signed)
Dtr tells me pt fell 9/4 went to [facility] and was transferred to [facility] on 9/5. Dtr aware of Dr. Curt Bears recommendation and that I will call her once we receive report and he reviews findings. She is agreeable to plan.

## 2018-10-08 ENCOUNTER — Other Ambulatory Visit: Payer: Self-pay | Admitting: Nurse Practitioner

## 2018-10-10 ENCOUNTER — Telehealth: Payer: Self-pay | Admitting: Cardiology

## 2018-10-10 NOTE — Telephone Encounter (Signed)
  Daughter is calling because she forgot to tell Sherri that the patient has to wear a neck brace for 3 months. He can only take it off once a day for them to check his skin and she is not sure that he can take it off to do the carotid. Please call back

## 2018-10-10 NOTE — Telephone Encounter (Signed)
Outpatient Medication Detail   Disp Refills Start End   dofetilide (TIKOSYN) 250 MCG capsule 180 capsule 3 10/10/2018    Sig: Take 1 capsule by mouth twice daily   Sent to pharmacy as: dofetilide (TIKOSYN) 250 MCG capsule   E-Prescribing Status: Receipt confirmed by pharmacy (10/10/2018 12:43 PM EDT)   Pharmacy  [facility] [place] [facility] [address]

## 2018-10-10 NOTE — Telephone Encounter (Signed)
 *  STAT* If patient is at the pharmacy, call can be transferred to refill team.   1. Which medications need to be refilled? (please list name of each medication and dose if known) dofetilide (TIKOSYN) 250 MCG capsule  2. Which pharmacy/location (including street and city if local pharmacy) is medication to be sent to? [place], [place], [place]  3. Do they need a 30 day or 90 day supply? [place]

## 2018-10-26 ENCOUNTER — Other Ambulatory Visit: Payer: Self-pay | Admitting: Cardiology

## 2018-11-06 ENCOUNTER — Emergency Department (HOSPITAL_COMMUNITY): Payer: Medicare Other

## 2018-11-06 ENCOUNTER — Encounter (HOSPITAL_COMMUNITY): Payer: Self-pay

## 2018-11-06 ENCOUNTER — Other Ambulatory Visit: Payer: Self-pay

## 2018-11-06 ENCOUNTER — Inpatient Hospital Stay (HOSPITAL_COMMUNITY)
Admission: EM | Admit: 2018-11-06 | Discharge: 2018-11-08 | DRG: 392 | Disposition: A | Discharge status: Home / Self Care | Payer: Medicare Other | Attending: Internal Medicine | Admitting: Internal Medicine

## 2018-11-06 DIAGNOSIS — Z20828 Contact with and (suspected) exposure to other viral communicable diseases: Secondary | ICD-10-CM | POA: Diagnosis present

## 2018-11-06 DIAGNOSIS — M1990 Unspecified osteoarthritis, unspecified site: Secondary | ICD-10-CM | POA: Diagnosis present

## 2018-11-06 DIAGNOSIS — Z79899 Other long term (current) drug therapy: Secondary | ICD-10-CM

## 2018-11-06 DIAGNOSIS — J439 Emphysema, unspecified: Secondary | ICD-10-CM | POA: Diagnosis present

## 2018-11-06 DIAGNOSIS — Z888 Allergy status to other drugs, medicaments and biological substances status: Secondary | ICD-10-CM

## 2018-11-06 DIAGNOSIS — N400 Enlarged prostate without lower urinary tract symptoms: Secondary | ICD-10-CM | POA: Diagnosis present

## 2018-11-06 DIAGNOSIS — Z91013 Allergy to seafood: Secondary | ICD-10-CM

## 2018-11-06 DIAGNOSIS — I482 Chronic atrial fibrillation, unspecified: Secondary | ICD-10-CM | POA: Diagnosis present

## 2018-11-06 DIAGNOSIS — Z882 Allergy status to sulfonamides status: Secondary | ICD-10-CM

## 2018-11-06 DIAGNOSIS — Z800 Family history of malignant neoplasm of digestive organs: Secondary | ICD-10-CM | POA: Diagnosis not present

## 2018-11-06 DIAGNOSIS — K5792 Diverticulitis of intestine, part unspecified, without perforation or abscess without bleeding: Principal | ICD-10-CM | POA: Diagnosis present

## 2018-11-06 DIAGNOSIS — Z8619 Personal history of other infectious and parasitic diseases: Secondary | ICD-10-CM | POA: Diagnosis not present

## 2018-11-06 DIAGNOSIS — Z91048 Other nonmedicinal substance allergy status: Secondary | ICD-10-CM | POA: Diagnosis not present

## 2018-11-06 DIAGNOSIS — K449 Diaphragmatic hernia without obstruction or gangrene: Secondary | ICD-10-CM | POA: Diagnosis present

## 2018-11-06 DIAGNOSIS — K625 Hemorrhage of anus and rectum: Secondary | ICD-10-CM | POA: Diagnosis present

## 2018-11-06 DIAGNOSIS — B356 Tinea cruris: Secondary | ICD-10-CM | POA: Diagnosis present

## 2018-11-06 DIAGNOSIS — G4730 Sleep apnea, unspecified: Secondary | ICD-10-CM | POA: Diagnosis present

## 2018-11-06 DIAGNOSIS — Z87891 Personal history of nicotine dependence: Secondary | ICD-10-CM | POA: Diagnosis not present

## 2018-11-06 DIAGNOSIS — Z7951 Long term (current) use of inhaled steroids: Secondary | ICD-10-CM | POA: Diagnosis not present

## 2018-11-06 DIAGNOSIS — I119 Hypertensive heart disease without heart failure: Secondary | ICD-10-CM | POA: Diagnosis present

## 2018-11-06 DIAGNOSIS — Z823 Family history of stroke: Secondary | ICD-10-CM

## 2018-11-06 DIAGNOSIS — S12100A Unspecified displaced fracture of second cervical vertebra, initial encounter for closed fracture: Secondary | ICD-10-CM | POA: Diagnosis present

## 2018-11-06 DIAGNOSIS — Z7901 Long term (current) use of anticoagulants: Secondary | ICD-10-CM | POA: Diagnosis not present

## 2018-11-06 DIAGNOSIS — K219 Gastro-esophageal reflux disease without esophagitis: Secondary | ICD-10-CM | POA: Diagnosis present

## 2018-11-06 DIAGNOSIS — K921 Melena: Secondary | ICD-10-CM | POA: Diagnosis present

## 2018-11-06 DIAGNOSIS — U071 COVID-19: Secondary | ICD-10-CM

## 2018-11-06 LAB — SARS CORONAVIRUS 2 BY RT PCR (HOSPITAL ORDER, PERFORMED IN ~~LOC~~ HOSPITAL LAB): SARS Coronavirus 2: POSITIVE — AB

## 2018-11-06 LAB — URINALYSIS, ROUTINE W REFLEX MICROSCOPIC
Bilirubin Urine: NEGATIVE
Glucose, UA: NEGATIVE mg/dL
Hgb urine dipstick: NEGATIVE
Ketones, ur: NEGATIVE mg/dL
Leukocytes,Ua: NEGATIVE
Nitrite: NEGATIVE
Protein, ur: NEGATIVE mg/dL
Specific Gravity, Urine: 1.019 (ref 1.005–1.030)
pH: 6 (ref 5.0–8.0)

## 2018-11-06 LAB — PROTIME-INR
INR: 1.3 — ABNORMAL HIGH (ref 0.8–1.2)
Prothrombin Time: 16.3 seconds — ABNORMAL HIGH (ref 11.4–15.2)

## 2018-11-06 LAB — COMPREHENSIVE METABOLIC PANEL
ALT: 13 U/L (ref 0–44)
AST: 19 U/L (ref 15–41)
Albumin: 3.4 g/dL — ABNORMAL LOW (ref 3.5–5.0)
Alkaline Phosphatase: 61 U/L (ref 38–126)
Anion gap: 10 (ref 5–15)
BUN: 14 mg/dL (ref 8–23)
CO2: 24 mmol/L (ref 22–32)
Calcium: 9.6 mg/dL (ref 8.9–10.3)
Chloride: 107 mmol/L (ref 98–111)
Creatinine, Ser: 1.21 mg/dL (ref 0.61–1.24)
GFR calc Af Amer: >60 mL/min (ref >60)
GFR calc non Af Amer: 54 mL/min — ABNORMAL LOW (ref >60)
Glucose, Bld: 95 mg/dL (ref 70–99)
Potassium: 4.1 mmol/L (ref 3.5–5.1)
Sodium: 141 mmol/L (ref 135–145)
Total Bilirubin: 0.8 mg/dL (ref 0.3–1.2)
Total Protein: 6 g/dL — ABNORMAL LOW (ref 6.5–8.1)

## 2018-11-06 LAB — CBC
HCT: 41.1 % (ref 39.0–52.0)
Hemoglobin: 12.9 g/dL — ABNORMAL LOW (ref 13.0–17.0)
MCH: 27.6 pg (ref 26.0–34.0)
MCHC: 31.4 g/dL (ref 30.0–36.0)
MCV: 87.8 fL (ref 80.0–100.0)
Platelets: 119 10*3/uL — ABNORMAL LOW (ref 150–400)
RBC: 4.68 MIL/uL (ref 4.22–5.81)
RDW: 15.3 % (ref 11.5–15.5)
WBC: 5.8 10*3/uL (ref 4.0–10.5)
nRBC: 0 % (ref 0.0–0.2)

## 2018-11-06 LAB — ABO/RH: ABO/RH(D): B NEG

## 2018-11-06 LAB — TYPE AND SCREEN
ABO/RH(D): B NEG
Antibody Screen: NEGATIVE

## 2018-11-06 LAB — I-STAT CREATININE, ED: Creatinine, Ser: 1.2 mg/dL (ref 0.61–1.24)

## 2018-11-06 LAB — POC OCCULT BLOOD, ED: Fecal Occult Bld: POSITIVE — AB

## 2018-11-06 MED ORDER — FUROSEMIDE 40 MG PO TABS
40.0000 mg | ORAL_TABLET | ORAL | Status: DC
  Administered 2018-11-07: 40 mg via ORAL
  Filled 2018-11-06: qty 1

## 2018-11-06 MED ORDER — METOPROLOL TARTRATE 25 MG PO TABS
25.0000 mg | ORAL_TABLET | Freq: Two times a day (BID) | ORAL | Status: DC
  Administered 2018-11-07 – 2018-11-08 (×4): 25 mg via ORAL
  Filled 2018-11-06 (×4): qty 1

## 2018-11-06 MED ORDER — ONDANSETRON HCL 4 MG/2ML IJ SOLN: 4.0000 mg | Freq: Four times a day (QID) | INTRAMUSCULAR | Status: DC | PRN

## 2018-11-06 MED ORDER — IOHEXOL 300 MG/ML  SOLN
100.0000 mL | Freq: Once | INTRAMUSCULAR | Status: AC | PRN
  Administered 2018-11-06: 100 mL via INTRAVENOUS

## 2018-11-06 MED ORDER — GABAPENTIN 100 MG PO CAPS
100.0000 mg | ORAL_CAPSULE | Freq: Two times a day (BID) | ORAL | Status: DC
  Administered 2018-11-07 – 2018-11-08 (×4): 100 mg via ORAL
  Filled 2018-11-06 (×4): qty 1

## 2018-11-06 MED ORDER — MOMETASONE FURO-FORMOTEROL FUM 200-5 MCG/ACT IN AERO
2.0000 | INHALATION_SPRAY | Freq: Two times a day (BID) | RESPIRATORY_TRACT | Status: DC
  Administered 2018-11-07 – 2018-11-08 (×3): 2 via RESPIRATORY_TRACT
  Filled 2018-11-06: qty 8.8

## 2018-11-06 MED ORDER — ONDANSETRON HCL 4 MG PO TABS: 4.0000 mg | ORAL_TABLET | Freq: Four times a day (QID) | ORAL | Status: DC | PRN

## 2018-11-06 MED ORDER — DOFETILIDE 250 MCG PO CAPS
250.0000 ug | ORAL_CAPSULE | Freq: Two times a day (BID) | ORAL | Status: DC
  Administered 2018-11-07 – 2018-11-08 (×4): 250 ug via ORAL
  Filled 2018-11-06 (×4): qty 1

## 2018-11-06 MED ORDER — SODIUM CHLORIDE 0.9 % IV BOLUS
1000.0000 mL | Freq: Once | INTRAVENOUS | Status: AC
  Administered 2018-11-06: 1000 mL via INTRAVENOUS

## 2018-11-06 MED ORDER — POTASSIUM CHLORIDE CRYS ER 20 MEQ PO TBCR
20.0000 meq | EXTENDED_RELEASE_TABLET | Freq: Every day | ORAL | Status: DC
  Administered 2018-11-06 – 2018-11-08 (×3): 20 meq via ORAL
  Filled 2018-11-06 (×2): qty 1
  Filled 2018-11-06: qty 2

## 2018-11-06 MED ORDER — SERTRALINE HCL 50 MG PO TABS
25.0000 mg | ORAL_TABLET | Freq: Every day | ORAL | Status: DC
  Administered 2018-11-07 (×2): 25 mg via ORAL
  Filled 2018-11-06 (×2): qty 1

## 2018-11-06 MED ORDER — FERROUS SULFATE 325 (65 FE) MG PO TABS
325.0000 mg | ORAL_TABLET | Freq: Every day | ORAL | Status: DC
  Administered 2018-11-07 – 2018-11-08 (×2): 325 mg via ORAL
  Filled 2018-11-06 (×2): qty 1

## 2018-11-06 MED ORDER — ACETAMINOPHEN 500 MG PO TABS
1000.0000 mg | ORAL_TABLET | Freq: Once | ORAL | Status: AC
  Administered 2018-11-06: 1000 mg via ORAL
  Filled 2018-11-06: qty 2

## 2018-11-06 MED ORDER — PIPERACILLIN-TAZOBACTAM 3.375 G IVPB 30 MIN
3.3750 g | Freq: Once | INTRAVENOUS | Status: AC
  Administered 2018-11-06: 3.375 g via INTRAVENOUS
  Filled 2018-11-06: qty 50

## 2018-11-06 MED ORDER — MIDODRINE HCL 5 MG PO TABS
2.5000 mg | ORAL_TABLET | Freq: Two times a day (BID) | ORAL | Status: DC
  Administered 2018-11-07 (×2): 2.5 mg via ORAL
  Filled 2018-11-06 (×3): qty 1

## 2018-11-06 MED ORDER — PIPERACILLIN-TAZOBACTAM 3.375 G IVPB
3.3750 g | Freq: Three times a day (TID) | INTRAVENOUS | Status: DC
  Administered 2018-11-07 – 2018-11-08 (×5): 3.375 g via INTRAVENOUS
  Filled 2018-11-06 (×6): qty 50

## 2018-11-06 MED ORDER — ISOSORBIDE MONONITRATE ER 30 MG PO TB24
15.0000 mg | ORAL_TABLET | Freq: Every day | ORAL | Status: DC
  Administered 2018-11-06 – 2018-11-08 (×3): 15 mg via ORAL
  Filled 2018-11-06 (×3): qty 1

## 2018-11-06 MED ORDER — PANTOPRAZOLE SODIUM 40 MG IV SOLR
40.0000 mg | Freq: Two times a day (BID) | INTRAVENOUS | Status: DC
  Administered 2018-11-07 (×2): 40 mg via INTRAVENOUS
  Filled 2018-11-06 (×2): qty 40

## 2018-11-06 MED ORDER — SODIUM CHLORIDE 0.9 % IV SOLN
INTRAVENOUS | Status: DC
  Administered 2018-11-06 – 2018-11-08 (×4): via INTRAVENOUS

## 2018-11-06 MED ORDER — TAMSULOSIN HCL 0.4 MG PO CAPS
0.4000 mg | ORAL_CAPSULE | Freq: Two times a day (BID) | ORAL | Status: DC
  Administered 2018-11-07 – 2018-11-08 (×4): 0.4 mg via ORAL
  Filled 2018-11-06 (×4): qty 1

## 2018-11-06 MED ORDER — ALBUTEROL SULFATE HFA 108 (90 BASE) MCG/ACT IN AERS
2.0000 | INHALATION_SPRAY | RESPIRATORY_TRACT | Status: DC | PRN
  Filled 2018-11-06: qty 6.7

## 2018-11-06 NOTE — H&P (Signed)
[facility]                                                                                    Patient Demographics  Ronnie Torres, is a 85 y.o. male  CSN: [id number]  MRN: [id number]  DOB - 1929-08-08  Admit Date - 11/06/2018  Outpatient Primary MD for the patient is Redmond Pulling, MD   With History of -  Past Medical History:  Diagnosis Date  . Acid reflux   . Arthritis   . COPD (chronic obstructive pulmonary disease) ([place])   . Emphysema lung ([place])   . Hiatal hernia   . Sleep apnea       Past Surgical History:  Procedure Laterality Date  . CARDIOVERSION N/A 12/01/2017   Procedure: CARDIOVERSION;  Surgeon: Jerline Pain, MD;  Location: [facility] ENDOSCOPY;  Service: Cardiovascular;  Laterality: N/A;  . CORONARY ANGIOGRAPHY N/A 11/02/2017   Procedure: CORONARY ANGIOGRAPHY (CATH LAB);  Surgeon: Sherren Mocha, MD;  Location: [place] CV LAB;  Service: Cardiovascular;  Laterality: N/A;    in for   Chief Complaint  Patient presents with  . GI Bleeding     HPI  Ronnie Torres  is a 85 y.o. male, with past medical history significant for GERD, COPD, emphysema and acid reflux disease who acquired COVID-19 in July 2020, treated , presenting today with bloody diarrhea after a period of constipation.  Patient takes Eliquis for A. fib.  Patient had recently a C2 fracture that was repaired at [facility] recently, patient still has a neck brace Patient denies any history of nausea, vomiting, abdominal pain, urinary symptoms.  Family reports weakness for the last 3 to 4 weeks progressively worsening. Work-up in the emergency room showed a hemoglobin of 12.9 with guaiac positive stools. CT scan of the abdomen showed diverticulitis Patient was started on Zosyn CT scan of the abdomen also showed nephrolithiasis and prostatomegaly His 2-hour test for COVID-19 was positive.  Ordered a 12-hour COVID-19 test.     Review of Systems    Denies chest pains, shortness of breath,  nausea vomiting or diarrhea.   Social History Social History   Tobacco Use  . Smoking status: Former Smoker    Quit date: 1975    Years since quitting: 45.8  . Smokeless tobacco: Never Used  Substance Use Topics  . Alcohol use: Never    Frequency: Never     Family History Family History  Problem Relation Age of Onset  . Other Mother        died in childbirth  . CVA Father   . Dementia Sister   . Pancreatic cancer Brother   . Cancer Brother   . Atrial fibrillation Brother   . Dementia Sister   . Cancer Daughter      Prior to Admission medications   Medication Sig Start Date End Date Taking? Authorizing Provider  albuterol (PROVENTIL HFA;VENTOLIN HFA) 108 (90 Base) MCG/ACT inhaler Inhale 2 puffs into the lungs every 4 (four) hours as needed for wheezing or shortness of breath.     [provider]  apixaban (ELIQUIS) 5 MG TABS tablet Take 1 tablet (5 mg total) by  mouth 2 (two) times daily. 12/02/17   Gypsy Balsam K, NP  budesonide-formoterol (SYMBICORT) 160-4.5 MCG/ACT inhaler Inhale 2 puffs into the lungs 2 (two) times daily. 03/07/12   [provider]  dofetilide (TIKOSYN) 250 MCG capsule Take 1 capsule by mouth twice daily 10/10/18   Gypsy Balsam K, NP  ferrous sulfate 324 (65 Fe) MG TBEC Take 1 tablet by mouth daily.    [provider]  furosemide (LASIX) 40 MG tablet Take 1 tablet (40 mg total) by mouth every morning. 01/07/18   [name], Andree Coss, MD  gabapentin (NEURONTIN) 100 MG capsule Take 100 mg by mouth 2 (two) times daily.  04/06/12   [provider]  isosorbide mononitrate (IMDUR) 30 MG 24 hr tablet TAKE ONE-HALF TABLET BY  MOUTH DAILY 10/27/18   [name], Andree Coss, MD  Menthol, Topical Analgesic, (STOPAIN) 8 % LIQD Apply 1 application topically daily as needed (joint pain).    [provider]  metoprolol tartrate (LOPRESSOR) 25 MG tablet Take 1 tablet (25 mg total) by mouth 2 (two) times daily. 09/23/18   [name],  Andree Coss, MD  midodrine (PROAMATINE) 2.5 MG tablet Take 2.5 mg by mouth 2 (two) times daily.  07/19/17   [provider]  omeprazole (PRILOSEC) 20 MG capsule Take 20 mg by mouth 2 (two) times daily.  10/25/14   [provider]  potassium chloride SA (K-DUR,KLOR-CON) 20 MEQ tablet Take 1 tablet (20 mEq total) by mouth daily. 01/28/18   Newman Nip, NP  senna-docusate (SENOKOT S) 8.6-50 MG tablet Take 1 tablet by mouth at bedtime as needed for mild constipation.    [provider]  sertraline (ZOLOFT) 25 MG tablet Take 25 mg by mouth at bedtime.  11/01/17   [provider]  tamsulosin (FLOMAX) 0.4 MG CAPS capsule Take 0.4 mg by mouth 2 (two) times daily.  02/01/17   [provider]  triamcinolone cream (KENALOG) 0.1 % Apply 1 application topically daily as needed (rash on arms).    [provider]    Allergies  Allergen Reactions  . Shellfish Allergy Hives  . Tape Other (See Comments)    "takes skin off"  . Sulfa Antibiotics Rash  . Trazodone And Nefazodone Rash    Physical Exam  Vitals  Blood pressure (!) 94/57, pulse 99, temperature 100.2 F (37.9 C), temperature source Oral, resp. rate 19, height  (1.626 m), weight 82.6 kg, SpO2 91 %.  General appearance, looks chronically ill, in no acute distress HEENT no jaundice or pallor, no facial deviation Neck brace in place Heart irregularly irregular, no murmurs gallops rubs Lungs no wheezing or rhonchi but decreased inspiratory effort Abdomen soft, nontender, bowel sounds present, no distention noted.  The     large ventral hernia noted. Extremities no clubbing cyanosis mild dependent edema Skin no rashes or ulcers .   Data Review  CBC Recent Labs  Lab 11/06/18 1416  WBC 5.8  HGB 12.9*  HCT 41.1  PLT 119*  MCV 87.8  MCH 27.6  MCHC 31.4  RDW 15.3    ------------------------------------------------------------------------------------------------------------------  Chemistries  Recent Labs  Lab 11/06/18 1416 11/06/18 1511  NA 141  --   K 4.1  --   CL 107  --   CO2 24  --   GLUCOSE 95  --   BUN 14  --   CREATININE 1.21 1.20  CALCIUM 9.6  --   AST 19  --   ALT 13  --  ALKPHOS 61  --   BILITOT 0.8  --    ------------------------------------------------------------------------------------------------------------------ estimated creatinine clearance is 42.1 mL/min (by C-G formula based on SCr of 1.2 mg/dL). ------------------------------------------------------------------------------------------------------------------ No results for input(s): TSH, T4TOTAL, T3FREE, THYROIDAB in the last 72 hours.  Invalid input(s): FREET3   Coagulation profile Recent Labs  Lab 11/06/18 1416  INR 1.3*   ------------------------------------------------------------------------------------------------------------------- No results for input(s): DDIMER in the last 72 hours. -------------------------------------------------------------------------------------------------------------------  Cardiac Enzymes No results for input(s): CKMB, TROPONINI, MYOGLOBIN in the last 168 hours.  Invalid input(s): CK ------------------------------------------------------------------------------------------------------------------ Invalid input(s): POCBNP   ---------------------------------------------------------------------------------------------------------------  Urinalysis    Component Value Date/Time   COLORURINE YELLOW 11/06/2018 1614   APPEARANCEUR CLEAR 11/06/2018 1614   LABSPEC 1.019 11/06/2018 1614   PHURINE 6.0 11/06/2018 1614   GLUCOSEU NEGATIVE 11/06/2018 1614   HGBUR NEGATIVE 11/06/2018 1614   BILIRUBINUR NEGATIVE 11/06/2018 1614   KETONESUR NEGATIVE 11/06/2018 1614   PROTEINUR NEGATIVE 11/06/2018 1614   NITRITE NEGATIVE  11/06/2018 1614   LEUKOCYTESUR NEGATIVE 11/06/2018 1614    ----------------------------------------------------------------------------------------------------------------     Imaging results:   Ct Abdomen Pelvis W Contrast  Result Date: 11/06/2018 CLINICAL DATA:  Weakness and possible bloody stool since last night. Fall 1 month ago with C2 fracture. On blood thinner. EXAM: CT ABDOMEN AND PELVIS WITH CONTRAST TECHNIQUE: Multidetector CT imaging of the abdomen and pelvis was performed using the standard protocol following bolus administration of intravenous contrast. CONTRAST:  100mL OMNIPAQUE IOHEXOL 300 MG/ML  SOLN COMPARISON:  None. FINDINGS: Lower chest: Bibasilar scarring. Mild cardiomegaly, without pericardial or pleural effusion. Moderate hiatal hernia. Hepatobiliary: Hepatic cysts and too small to characterize lesions. Normal gallbladder, without biliary ductal dilatation. Pancreas: Normal, without mass or ductal dilatation. Spleen: Normal in size, without focal abnormality. Adrenals/Urinary Tract: Normal adrenal glands. Renal cysts of up to 4.6 cm. Other renal lesions which are too small to characterize. A 5 mm lower pole left renal collecting system calculus. Punctate right renal collecting system stone. No hydronephrosis. Normal urinary bladder. Stomach/Bowel: Normal remainder of the stomach. Abnormal appearance of the descending/sigmoid junction. Soft tissue fullness in the region of multiple diverticula, including on 59/3 and 65/6. Apparent luminal outpouchings both laterally on coronal image 66 and medially, towards the sigmoid, including on 64/6. No convincing evidence of surrounding inflammation. Normal terminal ileum and appendix.  Normal small bowel. Vascular/Lymphatic: Aortic and branch vessel atherosclerosis. No abdominopelvic adenopathy. Reproductive: Mild prostatomegaly. Other: No significant free fluid.  No free intraperitoneal air. Musculoskeletal: Thoracolumbar spondylosis.  Intramuscular lipoma within the right gluteal is a 5.7 cm. IMPRESSION: 1. Abnormal appearance of the descending/sigmoid colon junction. Soft tissue fullness in the region of multiple diverticula, with areas of gas along the periphery of the descending colonic lumen as detailed above. Considerations include sequelae of diverticulitis, with muscular hypertrophy and residual GI and diverticula. Differential considerations include colocolic fistula. Cannot exclude underlying carcinoma. No convincing evidence of superimposed diverticulitis or colitis. 2. Moderate hiatal hernia. 3.  Aortic Atherosclerosis (ICD10-I70.0). 4. Prostatomegaly. 5. Bilateral nephrolithiasis. Electronically Signed   By: Jeronimo GreavesKyle  Talbot M.D.   On: 11/06/2018 16:15   Dg Chest Port 1 View  Result Date: 11/06/2018 CLINICAL DATA:  Fever and weakness. EXAM: PORTABLE CHEST 1 VIEW COMPARISON:  Cardiac CT dated October 01, 2017. FINDINGS: Mild cardiomegaly. Atherosclerotic calcification of the aortic arch. Pulmonary vascular congestion with diffuse interstitial thickening. No consolidation, pneumothorax, or large pleural effusion. No acute osseous abnormality. IMPRESSION: 1. Cardiomegaly with mild interstitial pulmonary edema. Electronically Signed   By: Teresita MaduraWilliam T  Derry M.D.   On: 11/06/2018 15:43    EKG shows A. fib with a rate of 108 with no ischemic changes  Assessment & Plan  Diverticulitis, continue with IV antibiotics, Zosyn  GI bleed, patient on Eliquis  Hold Eliquis GI consult Protonix IV Follow H/H  A. Fib Heart rate slightly elevated  Monitor  COPD Continue with nebulizer treatments  GERD Continue with Protonix      DVT Prophylaxis SCDs  AM Labs Ordered, also please review Full Orders  Family Communication: Called family, no answer code Status full  Disposition Plan: Home  Time spent in minutes : 42 minutes  Condition GUARDED   @SIGNATURE @

## 2018-11-06 NOTE — Progress Notes (Signed)
Pharmacy Antibiotic Note  Ronnie Torres is a 85 y.o. male admitted on 11/06/2018 with bloody stool after being constipated for 3-4 days.   CT shows gas along the descending colonic lumen.  Pharmacy has been consulted for Zosyn dosing for intra-abdominal infection.  SCr 1.2, CrCL 42 ml/min, Tmax 100.2, WBC WNL.  Plan: Zosyn EID 3.375gm IV Q8H Pharmacy will sign off and follow peripherally.  Thank you for the consult!   Height: 5\' 4"  (162.6 cm) Weight: 182 lb 1.6 oz (82.6 kg) IBW/kg (Calculated) : 59.2  Temp (24hrs), Avg:100.5 F (38.1 C), Min:100.2 F (37.9 C), Max:100.7 F (38.2 C)  Recent Labs  Lab 11/06/18 1416 11/06/18 1511  WBC 5.8  --   CREATININE 1.21 1.20    Estimated Creatinine Clearance: 42.1 mL/min (by C-G formula based on SCr of 1.2 mg/dL).    Allergies  Allergen Reactions  . Shellfish Allergy Hives  . Tape Other (See Comments)    "takes skin off"  . Sulfa Antibiotics Rash  . Trazodone And Nefazodone Rash    Zosyn 10/11 >>  10/11 covid - positive 10/11 UCx -  10/11 BCx -   [name] D. Mina Marble, PharmD, BCPS, [place] 11/06/2018, 6:13 PM

## 2018-11-06 NOTE — ED Notes (Signed)
X-ray at bedside

## 2018-11-06 NOTE — ED Triage Notes (Signed)
Pt in with c/o weakness and possible bloody stool since last night. Arrives in c-collar, has C2 fx from September fall. Takes eliquis, family reports incr generalized weakness worse today

## 2018-11-06 NOTE — ED Provider Notes (Signed)
Assumed care of patient at 3 PM with patient who arrives with rectal bleeding, abdominal pain, fever, tachycardia.  Sepsis work-up initiated.  Concern for intra-abdominal infection.  Patient had recent C-spine injury.  Has had multiple bouts of bloody mixed diarrhea after being constipated.  Has history of diverticulitis.  Has become increasingly generally weak and possibly dehydrated at home.  Awaiting full work-up.  Patient with diverticulitis on CT scan.  Fecal occult is positive however hemoglobin is normal.  Patient with no signs of urinary tract infection.  Coronavirus test is positive.  Otherwise no significant anemia, electrolyte abnormality.  CT scan shows mostly diverticulitis.  Do not have suspicion for fistula.  Will give IV Zosyn.  Given severe GI symptoms, general weakness in the setting of coronavirus.  Will admit for further care.  No respiratory complaints.  Hemodynamically stable throughout my care.  No lactic acidosis, no leukocytosis.  Does not technically meet sepsis criteria.  This chart was dictated using voice recognition software.  Despite best efforts to proofread,  errors can occur which can change the documentation meaning.  Talen Poser was evaluated in Emergency Department on 11/06/2018 for the symptoms described in the history of present illness. He was evaluated in the context of the global COVID-19 pandemic, which necessitated consideration that the patient might be at risk for infection with the SARS-CoV-2 virus that causes COVID-19. Institutional protocols and algorithms that pertain to the evaluation of patients at risk for COVID-19 are in a state of rapid change based on information released by regulatory bodies including the CDC and federal and state organizations. These policies and algorithms were followed during the patient's care in the ED.    Lennice Sites, DO 11/06/18 1644

## 2018-11-06 NOTE — ED Notes (Signed)
ED Provider at bedside. 

## 2018-11-06 NOTE — ED Provider Notes (Signed)
[facility] EMERGENCY DEPARTMENT Provider Note   CSN: [id number] Arrival date & time: 11/06/18  1354     History   Chief Complaint Chief Complaint  Patient presents with  . GI Bleeding    HPI Bowen Kia is a 85 y.o. male.     85 yo M with a cc of bloody stool.  The patient had been constipated over the past 3 to 4 days and so he has been escalating his home doses of laxatives.  Started having bowel movements last night and having diarrhea.  Noticed that his stool was dark and had some blood mixed in.  He is currently on Eliquis as well as aspirin.  He recently had a C2 fracture that was repaired at [facility].  He denies any infectious symptoms denies cough congestion or fever denies abdominal pain denies vomiting or diarrhea denies urinary symptoms.  He also has been progressively weaker over the past 3 to 4 days.  Family states it is very difficult to get him up and moving around currently.  The history is provided by the patient and a relative.  Illness Severity:  Moderate Onset quality:  Gradual Duration:  2 days Timing:  Constant Progression:  Worsening Chronicity:  New Associated symptoms: no abdominal pain, no chest pain, no congestion, no diarrhea, no fever, no headaches, no myalgias, no rash, no shortness of breath and no vomiting     Past Medical History:  Diagnosis Date  . Acid reflux   . Arthritis   . COPD (chronic obstructive pulmonary disease) (HCC)   . Emphysema lung (HCC)   . Hiatal hernia   . Sleep apnea     Patient Active Problem List   Diagnosis Date Noted  . Persistent atrial fibrillation (HCC) 11/29/2017  . Coronary artery disease involving native coronary artery with angina pectoris (HCC) 11/02/2017  . Atrial fibrillation (HCC) 07/05/2017  . Primary osteoarthritis of left knee 05/22/2015  . Primary osteoarthritis, right shoulder 05/22/2015  . Trochanteric bursitis, left hip 05/22/2015  . Dermatitis 04/18/2015  . Umbilical  hernia without obstruction and without gangrene 10/19/2014  . Epigastric pain 04/10/2014  . AP (abdominal pain) 05/01/2013  . History of colon polyps 04/19/2012  . Stricture esophagus 04/19/2012  . Rash 02/27/2012  . Dyspnea 08/31/2011  . Moderate COPD (chronic obstructive pulmonary disease) (HCC) 08/31/2011  . Hematochezia 03/27/2011  . Obstructive chronic bronchitis (HCC) 03/05/2011    Past Surgical History:  Procedure Laterality Date  . CARDIOVERSION N/A 12/01/2017   Procedure: CARDIOVERSION;  Surgeon: Jake Bathe, MD;  Location: [facility] ENDOSCOPY;  Service: Cardiovascular;  Laterality: N/A;  . CORONARY ANGIOGRAPHY N/A 11/02/2017   Procedure: CORONARY ANGIOGRAPHY (CATH LAB);  Surgeon: Tonny Bollman, MD;  Location: [facility] INVASIVE CV LAB;  Service: Cardiovascular;  Laterality: N/A;        Home Medications    Prior to Admission medications   Medication Sig Start Date End Date Taking? Authorizing Provider  albuterol (PROVENTIL HFA;VENTOLIN HFA) 108 (90 Base) MCG/ACT inhaler Inhale 2 puffs into the lungs every 4 (four) hours as needed for wheezing or shortness of breath.     [provider]  apixaban (ELIQUIS) 5 MG TABS tablet Take 1 tablet (5 mg total) by mouth 2 (two) times daily. 12/02/17   Gypsy Balsam K, NP  budesonide-formoterol (SYMBICORT) 160-4.5 MCG/ACT inhaler Inhale 2 puffs into the lungs 2 (two) times daily. 03/07/12   [provider]  dofetilide (TIKOSYN) 250 MCG capsule Take 1 capsule by mouth twice  daily 10/10/18   Gypsy Balsam K, NP  ferrous sulfate 324 (65 Fe) MG TBEC Take 1 tablet by mouth daily.    [provider]  furosemide (LASIX) 40 MG tablet Take 1 tablet (40 mg total) by mouth every morning. 01/07/18   [name], Andree Coss, MD  gabapentin (NEURONTIN) 100 MG capsule Take 100 mg by mouth 2 (two) times daily.  04/06/12   [provider]  isosorbide mononitrate (IMDUR) 30 MG 24 hr tablet TAKE ONE-HALF TABLET BY  MOUTH DAILY 10/27/18    [name], Andree Coss, MD  Menthol, Topical Analgesic, (STOPAIN) 8 % LIQD Apply 1 application topically daily as needed (joint pain).    [provider]  metoprolol tartrate (LOPRESSOR) 25 MG tablet Take 1 tablet (25 mg total) by mouth 2 (two) times daily. 09/23/18   [name], Andree Coss, MD  midodrine (PROAMATINE) 2.5 MG tablet Take 2.5 mg by mouth 2 (two) times daily.  07/19/17   [provider]  omeprazole (PRILOSEC) 20 MG capsule Take 20 mg by mouth 2 (two) times daily.  10/25/14   [provider]  potassium chloride SA (K-DUR,KLOR-CON) 20 MEQ tablet Take 1 tablet (20 mEq total) by mouth daily. 01/28/18   Newman Nip, NP  senna-docusate (SENOKOT S) 8.6-50 MG tablet Take 1 tablet by mouth at bedtime as needed for mild constipation.    [provider]  sertraline (ZOLOFT) 25 MG tablet Take 25 mg by mouth at bedtime.  11/01/17   [provider]  tamsulosin (FLOMAX) 0.4 MG CAPS capsule Take 0.4 mg by mouth 2 (two) times daily.  02/01/17   [provider]  triamcinolone cream (KENALOG) 0.1 % Apply 1 application topically daily as needed (rash on arms).    [provider]    Family History Family History  Problem Relation Age of Onset  . Other Mother        died in childbirth  . CVA Father   . Dementia Sister   . Pancreatic cancer Brother   . Cancer Brother   . Atrial fibrillation Brother   . Dementia Sister   . Cancer Daughter     Social History Social History   Tobacco Use  . Smoking status: Former Smoker    Quit date: 1975    Years since quitting: 45.8  . Smokeless tobacco: Never Used  Substance Use Topics  . Alcohol use: Never    Frequency: Never  . Drug use: Never     Allergies   Shellfish allergy, Tape, Sulfa antibiotics, and Trazodone and nefazodone   Review of Systems Review of Systems  Constitutional: Negative for chills and fever.  HENT: Negative for congestion and facial swelling.   Eyes: Negative for  discharge and visual disturbance.  Respiratory: Negative for shortness of breath.   Cardiovascular: Negative for chest pain and palpitations.  Gastrointestinal: Positive for blood in stool. Negative for abdominal pain, diarrhea and vomiting.  Musculoskeletal: Negative for arthralgias and myalgias.  Skin: Negative for color change and rash.  Neurological: Positive for weakness. Negative for tremors, syncope and headaches.  Psychiatric/Behavioral: Negative for confusion and dysphoric mood.     Physical Exam Updated Vital Signs BP (!) 122/98   Pulse 99   Temp 100.2 F (37.9 C) (Oral)   Resp (!) 23   Ht  (1.626 m)   Wt 82.6 kg   SpO2 91%   BMI 31.26 kg/m   Physical Exam Vitals signs and nursing note reviewed.  Constitutional:  Appearance: He is well-developed.  HENT:     Head: Normocephalic and atraumatic.  Eyes:     Pupils: Pupils are equal, round, and reactive to light.  Neck:     Musculoskeletal: Normal range of motion and neck supple.     Vascular: No JVD.  Cardiovascular:     Rate and Rhythm: Normal rate and regular rhythm.     Heart sounds: No murmur. No friction rub. No gallop.   Pulmonary:     Effort: No respiratory distress.     Breath sounds: No wheezing.  Abdominal:     General: There is no distension.     Tenderness: There is no abdominal tenderness. There is no guarding or rebound.     Hernia: A hernia ( Large ventral) is present.  Genitourinary:    Comments: No appreciable hemorrhoids, dark stool no obvious gross blood Musculoskeletal: Normal range of motion.  Skin:    Coloration: Skin is not pale.     Findings: No rash.  Neurological:     Mental Status: He is alert and oriented to person, place, and time.  Psychiatric:        Behavior: Behavior normal.      ED Treatments / Results  Labs (all labs ordered are listed, but only abnormal results are displayed) Labs Reviewed  COMPREHENSIVE METABOLIC PANEL - Abnormal; Notable for the  following components:      Result Value   Total Protein 6.0 (*)    Albumin 3.4 (*)    GFR calc non Af Amer 54 (*)    All other components within normal limits  CBC - Abnormal; Notable for the following components:   Hemoglobin 12.9 (*)    Platelets 119 (*)    All other components within normal limits  PROTIME-INR - Abnormal; Notable for the following components:   Prothrombin Time 16.3 (*)    INR 1.3 (*)    All other components within normal limits  POC OCCULT BLOOD, ED - Abnormal; Notable for the following components:   Fecal Occult Bld POSITIVE (*)    All other components within normal limits  URINE CULTURE  CULTURE, BLOOD (ROUTINE X 2)  CULTURE, BLOOD (ROUTINE X 2)  SARS CORONAVIRUS 2 BY RT PCR (HOSPITAL ORDER, [place] [facility])  URINALYSIS, ROUTINE W REFLEX MICROSCOPIC  I-STAT CREATININE, ED  TYPE AND SCREEN    EKG EKG Interpretation  Date/Time:  Sunday November 06 2018 14:05:34 EDT Ventricular Rate:  108 PR Interval:  266 QRS Duration: 72 QT Interval:  298 QTC Calculation: 399 R Axis:   -58 Text Interpretation:  aifb vs sinus arrythmia Left axis deviation Anterolateral infarct , age undetermined Abnormal ECG No significant change since last tracing Confirmed by Marcin Holte ([id number]) on 11/06/2018 2:24:19 PM   Radiology No results found.  Procedures Procedures (including critical care time)  Medications Ordered in ED Medications  acetaminophen (TYLENOL) tablet 1,000 mg (1,000 mg Oral Given 11/06/18 1508)  sodium chloride 0.9 % bolus 1,000 mL (1,000 mLs Intravenous New Bag/Given 11/06/18 1513)     Initial Impression / Assessment and Plan / ED Course  I have reviewed the triage vital signs and the nursing notes.  Pertinent labs & imaging results that were available during my care of the patient were reviewed by me and considered in my medical decision making (see chart for details).        85  yo M with a chief complaint of bloody  diarrhea.  Going on for  the past couple days after he was significantly constipated.  Patient noted to have a fever, temp of 100.7.  Will obtain an infectious work-up.  As this is in the middle of the coronavirus pandemic will obtain a rapid COVID test.  CT scan abdomen pelvis to evaluate for colitis.  Patient care was signed out to Dr. Lockie Molauratolo please see his note for further details care in the ED.  The patients results and plan were reviewed and discussed.   Any x-rays performed were independently reviewed by myself.   Differential diagnosis were considered with the presenting HPI.  Medications  acetaminophen (TYLENOL) tablet 1,000 mg (1,000 mg Oral Given 11/06/18 1508)  sodium chloride 0.9 % bolus 1,000 mL (1,000 mLs Intravenous New Bag/Given 11/06/18 1513)    Vitals:   11/06/18 1403 11/06/18 1436 11/06/18 1445  BP: (!) 106/59 (!) 112/91 (!) 122/98  Pulse: 87 100 99  Resp: 18 17 (!) 23  Temp: (!) 100.7 F (38.2 C) 100.2 F (37.9 C)   TempSrc: Oral Oral   SpO2: 100% 96% 91%  Weight: 82.6 kg    Height: 5\' 4"  (1.626 m)      Final diagnoses:  Rectal bleeding      Final Clinical Impressions(s) / ED Diagnoses   Final diagnoses:  Rectal bleeding    ED Discharge Orders    None       Melene PlanFloyd, Maleta Pacha, DO 11/06/18 1520

## 2018-11-07 DIAGNOSIS — K5792 Diverticulitis of intestine, part unspecified, without perforation or abscess without bleeding: Principal | ICD-10-CM

## 2018-11-07 DIAGNOSIS — K625 Hemorrhage of anus and rectum: Secondary | ICD-10-CM

## 2018-11-07 DIAGNOSIS — U071 COVID-19: Secondary | ICD-10-CM

## 2018-11-07 LAB — BASIC METABOLIC PANEL
Anion gap: 8 (ref 5–15)
BUN: 12 mg/dL (ref 8–23)
CO2: 23 mmol/L (ref 22–32)
Calcium: 8.9 mg/dL (ref 8.9–10.3)
Chloride: 109 mmol/L (ref 98–111)
Creatinine, Ser: 1.17 mg/dL (ref 0.61–1.24)
GFR calc Af Amer: >60 mL/min (ref >60)
GFR calc non Af Amer: 56 mL/min — ABNORMAL LOW (ref >60)
Glucose, Bld: 87 mg/dL (ref 70–99)
Potassium: 4.1 mmol/L (ref 3.5–5.1)
Sodium: 140 mmol/L (ref 135–145)

## 2018-11-07 LAB — CBC
HCT: 39 % (ref 39.0–52.0)
Hemoglobin: 12.3 g/dL — ABNORMAL LOW (ref 13.0–17.0)
MCH: 27.7 pg (ref 26.0–34.0)
MCHC: 31.5 g/dL (ref 30.0–36.0)
MCV: 87.8 fL (ref 80.0–100.0)
Platelets: 96 10*3/uL — ABNORMAL LOW (ref 150–400)
RBC: 4.44 MIL/uL (ref 4.22–5.81)
RDW: 15.2 % (ref 11.5–15.5)
WBC: 4 10*3/uL (ref 4.0–10.5)
nRBC: 0 % (ref 0.0–0.2)

## 2018-11-07 LAB — URINE CULTURE: Culture: <10000 — AB

## 2018-11-07 LAB — SARS CORONAVIRUS 2 (TAT 6-24 HRS): SARS Coronavirus 2: NEGATIVE

## 2018-11-07 MED ORDER — ALBUTEROL SULFATE (2.5 MG/3ML) 0.083% IN NEBU: 2.5000 mg | INHALATION_SOLUTION | RESPIRATORY_TRACT | Status: DC | PRN

## 2018-11-07 MED ORDER — KETOCONAZOLE 2 % EX CREA
TOPICAL_CREAM | Freq: Every day | CUTANEOUS | Status: DC
  Administered 2018-11-07 – 2018-11-08 (×2): via TOPICAL
  Filled 2018-11-07: qty 15

## 2018-11-07 MED ORDER — FLUCONAZOLE 150 MG PO TABS
150.0000 mg | ORAL_TABLET | Freq: Once | ORAL | Status: AC
  Administered 2018-11-07: 150 mg via ORAL
  Filled 2018-11-07: qty 1

## 2018-11-07 NOTE — ED Notes (Signed)
ED TO INPATIENT HANDOFF REPORT  ED Nurse Name and Phone #: [phone]  S Name/Age/Gender Ronnie Torres 85 y.o. male Room/Bed: 015C/015C  Code Status   Code Status: Full Code  Home/SNF/Other Home Patient oriented to: self, place, time and situation Is this baseline? Yes   Triage Complete: Triage complete  Chief Complaint bloody stool   Triage Note Pt in with c/o weakness and possible bloody stool since last night. Arrives in c-collar, has C2 fx from September fall. Takes eliquis, family reports incr generalized weakness worse today   Allergies Allergies  Allergen Reactions  . Shellfish Allergy Hives  . Tape Other (See Comments)    "takes skin off"  . Sulfa Antibiotics Rash  . Trazodone And Nefazodone Rash    Level of Care/Admitting Diagnosis ED Disposition    ED Disposition Condition Comment   Admit  Hospital Area: [facility] [[id number]]  Level of Care: Telemetry Medical [104]  Covid Evaluation: Confirmed COVID Negative  Diagnosis: Diverticulitis [297989]  Admitting Physician: Carron Curie Bai.Lain  Attending Physician: Sharyon Medicus, ALI Bai.Lain  Estimated length of stay: past midnight tomorrow  Certification:: I certify this patient will need inpatient services for at least 2 midnights  PT Class (Do Not Modify): Inpatient [101]  PT Acc Code (Do Not Modify): Private [1]       B Medical/Surgery History Past Medical History:  Diagnosis Date  . Acid reflux   . Arthritis   . COPD (chronic obstructive pulmonary disease) (HCC)   . Emphysema lung (HCC)   . Hiatal hernia   . Sleep apnea    Past Surgical History:  Procedure Laterality Date  . CARDIOVERSION N/A 12/01/2017   Procedure: CARDIOVERSION;  Surgeon: Jake Bathe, MD;  Location: [facility] ENDOSCOPY;  Service: Cardiovascular;  Laterality: N/A;  . CORONARY ANGIOGRAPHY N/A 11/02/2017   Procedure: CORONARY ANGIOGRAPHY (CATH LAB);  Surgeon: Tonny Bollman, MD;  Location: [facility] INVASIVE CV LAB;  Service:  Cardiovascular;  Laterality: N/A;     A IV Location/Drains/Wounds Patient Lines/Drains/Airways Status   Active Line/Drains/Airways    Name:   Placement date:   Placement time:   Site:   Days:   Peripheral IV 11/06/18 Right Antecubital   11/06/18    1503    Antecubital   1   Peripheral IV 11/06/18 Left Forearm   11/06/18    1834    Forearm   1          Intake/Output Last 24 hours  Intake/Output Summary (Last 24 hours) at 11/07/2018 0430 Last data filed at 11/06/2018 1819 Gross per 24 hour  Intake 1050 ml  Output -  Net 1050 ml    Labs/Imaging Results for orders placed or performed during the hospital encounter of 11/06/18 (from the past 48 hour(s))  Comprehensive metabolic panel     Status: Abnormal   Collection Time: 11/06/18  2:16 PM  Result Value Ref Range   Sodium 141 135 - 145 mmol/L   Potassium 4.1 3.5 - 5.1 mmol/L   Chloride 107 98 - 111 mmol/L   CO2 24 22 - 32 mmol/L   Glucose, Bld 95 70 - 99 mg/dL   BUN 14 8 - 23 mg/dL   Creatinine, Ser 2.11 0.61 - 1.24 mg/dL   Calcium 9.6 8.9 - 94.1 mg/dL   Total Protein 6.0 (L) 6.5 - 8.1 g/dL   Albumin 3.4 (L) 3.5 - 5.0 g/dL   AST 19 15 - 41 U/L   ALT 13 0 - 44 U/L  Alkaline Phosphatase 61 38 - 126 U/L   Total Bilirubin 0.8 0.3 - 1.2 mg/dL   GFR calc non Af Amer 54 (L) >60 mL/min   GFR calc Af Amer >60 >60 mL/min   Anion gap 10 5 - 15    Comment: Performed at [facility], [address], [place], [place] [address]  CBC     Status: Abnormal   Collection Time: 11/06/18  2:16 PM  Result Value Ref Range   WBC 5.8 4.0 - 10.5 K/uL   RBC 4.68 4.22 - 5.81 MIL/uL   Hemoglobin 12.9 (L) 13.0 - 17.0 g/dL   HCT 60.4 54.0 - 98.1 %   MCV 87.8 80.0 - 100.0 fL   MCH 27.6 26.0 - 34.0 pg   MCHC 31.4 30.0 - 36.0 g/dL   RDW 19.1 47.8 - 29.5 %   Platelets 119 (L) 150 - 400 K/uL    Comment: REPEATED TO VERIFY PLATELET COUNT CONFIRMED BY SMEAR SPECIMEN CHECKED FOR CLOTS    nRBC 0.0 0.0 - 0.2 %    Comment: Performed at [facility], [address] [place], [place] [address]  Type and screen [facility]     Status: None   Collection Time: 11/06/18  2:16 PM  Result Value Ref Range   ABO/RH(D) B NEG    Antibody Screen NEG    Sample Expiration      11/09/2018,2359 Performed at [facility], [address], [place], [place] [address]   Protime-INR - (order if Patient is taking Coumadin / Warfarin)     Status: Abnormal   Collection Time: 11/06/18  2:16 PM  Result Value Ref Range   Prothrombin Time 16.3 (H) 11.4 - 15.2 seconds   INR 1.3 (H) 0.8 - 1.2    Comment: (NOTE) INR goal varies based on device and disease states. Performed at [facility], [address], [place], [place] [address]   ABO/Rh     Status: None   Collection Time: 11/06/18  2:16 PM  Result Value Ref Range   ABO/RH(D)      B NEG Performed at [facility], [address], [place], [place] [address]   SARS Coronavirus 2 by RT PCR (hospital order, performed in [facility] hospital lab) Nasopharyngeal Nasopharyngeal Swab     Status: Abnormal   Collection Time: 11/06/18  2:49 PM   Specimen: Nasopharyngeal Swab  Result Value Ref Range   SARS Coronavirus 2 POSITIVE (A) NEGATIVE    Comment: RCRV J. KOPP RN, AT 1324 11/06/18 BY D. VANHOOK (NOTE) If result is NEGATIVE SARS-CoV-2 target nucleic acids are NOT DETECTED. The SARS-CoV-2 RNA is generally detectable in upper and lower  respiratory specimens during the acute phase of infection. The lowest  concentration of SARS-CoV-2 viral copies this assay can detect is 250  copies / mL. A negative result does not preclude SARS-CoV-2 infection  and should not be used as the sole basis for treatment or other  patient management decisions.  A negative result may occur with  improper specimen collection / handling, submission of specimen other  than nasopharyngeal swab, presence of viral mutation(s) within the  areas targeted by this assay, and inadequate  number of viral copies  (<250 copies / mL). A negative result must be combined with clinical  observations, patient history, and epidemiological information. If result is POSITIVE SARS-CoV-2 target nucleic acids are DETECTED. The SARS-CoV-2 RNA is generally detecta ble in  upper and lower  respiratory specimens during the acute phase of infection.  Positive  results are indicative of active infection with SARS-CoV-2.  Clinical  correlation with patient history and other diagnostic information is  necessary to determine patient infection status.  Positive results do  not rule out bacterial infection or co-infection with other viruses. If result is PRESUMPTIVE POSTIVE SARS-CoV-2 nucleic acids MAY BE PRESENT.   A presumptive positive result was obtained on the submitted specimen  and confirmed on repeat testing.  While 2019 novel coronavirus  (SARS-CoV-2) nucleic acids may be present in the submitted sample  additional confirmatory testing may be necessary for epidemiological  and / or clinical management purposes  to differentiate between  SARS-CoV-2 and other Sarbecovirus currently known to infect humans.  If clinically indicated additional testing with an alternate test  methodology [phone]([id number]) is advised. The SARS-CoV-2 RNA is genera lly  detectable in upper and lower respiratory specimens during the acute  phase of infection. The expected result is Negative. Fact Sheet for Patients:  BoilerBrush.com.cyhttps://www.fda.gov/media/136312/download Fact Sheet for Healthcare Providers: https://pope.com/https://www.fda.gov/media/136313/download This test is not yet approved or cleared by the [place] FDA and has been authorized for detection and/or diagnosis of SARS-CoV-2 by FDA under an Emergency Use Authorization (EUA).  This EUA will remain in effect (meaning this test can be used) for the duration of the COVID-19 declaration under Section 564(b)(1) of the Act, 21 U.S.C. section 360bbb-3(b)(1), unless the  authorization is terminated or revoked sooner. Performed at [facility]  Lab, [address] [place], [place] [address]   POC occult blood, ED     Status: Abnormal   Collection Time: 11/06/18  3:06 PM  Result Value Ref Range   Fecal Occult Bld POSITIVE (A) NEGATIVE  I-Stat Creatinine, ED (not at [facility])     Status: None   Collection Time: 11/06/18  3:11 PM  Result Value Ref Range   Creatinine, Ser 1.20 0.61 - 1.24 mg/dL  Urinalysis, Routine w reflex microscopic     Status: None   Collection Time: 11/06/18  4:14 PM  Result Value Ref Range   Color, Urine YELLOW YELLOW   APPearance CLEAR CLEAR   Specific Gravity, Urine 1.019 1.005 - 1.030   pH 6.0 5.0 - 8.0   Glucose, UA NEGATIVE NEGATIVE mg/dL   Hgb urine dipstick NEGATIVE NEGATIVE   Bilirubin Urine NEGATIVE NEGATIVE   Ketones, ur NEGATIVE NEGATIVE mg/dL   Protein, ur NEGATIVE NEGATIVE mg/dL   Nitrite NEGATIVE NEGATIVE   Leukocytes,Ua NEGATIVE NEGATIVE    Comment: Performed at [facility], [address] [place], [place] [address]  SARS CORONAVIRUS 2 (TAT 6-24 HRS) Nasopharyngeal Nasopharyngeal Swab     Status: None   Collection Time: 11/06/18  8:45 PM   Specimen: Nasopharyngeal Swab  Result Value Ref Range   SARS Coronavirus 2 NEGATIVE NEGATIVE    Comment: (NOTE) SARS-CoV-2 target nucleic acids are NOT DETECTED. The SARS-CoV-2 RNA is generally detectable in upper and lower respiratory specimens during the acute phase of infection. Negative results do not preclude SARS-CoV-2 infection, do not rule out co-infections with other pathogens, and should not be used as the sole basis for treatment or other patient management decisions. Negative results must be combined with clinical observations, patient history, and epidemiological information. The expected result is Negative. Fact Sheet for Patients: HairSlick.nohttps://www.fda.gov/media/138098/download Fact Sheet for Healthcare  Providers: quierodirigir.comhttps://www.fda.gov/media/138095/download This test is not yet approved or cleared by the [place] FDA and  has been authorized for  detection and/or diagnosis of SARS-CoV-2 by FDA under an Emergency Use Authorization (EUA). This EUA will remain  in effect (meaning this test can be used) for the duration of the COVID-19 declaration under Section 56 4(b)(1) of the Act, 21 U.S.C. section 360bbb-3(b)(1), unless the authorization is terminated or revoked sooner. Performed at [facility], [address] [place], [place] [address]    Ct Abdomen Pelvis W Contrast  Result Date: 11/06/2018 CLINICAL DATA:  Weakness and possible bloody stool since last night. Fall 1 month ago with C2 fracture. On blood thinner. EXAM: CT ABDOMEN AND PELVIS WITH CONTRAST TECHNIQUE: Multidetector CT imaging of the abdomen and pelvis was performed using the standard protocol following bolus administration of intravenous contrast. CONTRAST:  OMNIPAQUE IOHEXOL 300 MG/ML  SOLN COMPARISON:  None. FINDINGS: Lower chest: Bibasilar scarring. Mild cardiomegaly, without pericardial or pleural effusion. Moderate hiatal hernia. Hepatobiliary: Hepatic cysts and too small to characterize lesions. Normal gallbladder, without biliary ductal dilatation. Pancreas: Normal, without mass or ductal dilatation. Spleen: Normal in size, without focal abnormality. Adrenals/Urinary Tract: Normal adrenal glands. Renal cysts of up to 4.6 cm. Other renal lesions which are too small to characterize. A 5 mm lower pole left renal collecting system calculus. Punctate right renal collecting system stone. No hydronephrosis. Normal urinary bladder. Stomach/Bowel: Normal remainder of the stomach. Abnormal appearance of the descending/sigmoid junction. Soft tissue fullness in the region of multiple diverticula, including on 59/3 and 65/6. Apparent luminal outpouchings both laterally on coronal image 66 and medially, towards the sigmoid,  including on 64/6. No convincing evidence of surrounding inflammation. Normal terminal ileum and appendix.  Normal small bowel. Vascular/Lymphatic: Aortic and branch vessel atherosclerosis. No abdominopelvic adenopathy. Reproductive: Mild prostatomegaly. Other: No significant free fluid.  No free intraperitoneal air. Musculoskeletal: Thoracolumbar spondylosis. Intramuscular lipoma within the right gluteal is a 5.7 cm. IMPRESSION: 1. Abnormal appearance of the descending/sigmoid colon junction. Soft tissue fullness in the region of multiple diverticula, with areas of gas along the periphery of the descending colonic lumen as detailed above. Considerations include sequelae of diverticulitis, with muscular hypertrophy and residual GI and diverticula. Differential considerations include colocolic fistula. Cannot exclude underlying carcinoma. No convincing evidence of superimposed diverticulitis or colitis. 2. Moderate hiatal hernia. 3.  Aortic Atherosclerosis (ICD10-I70.0). 4. Prostatomegaly. 5. Bilateral nephrolithiasis. Electronically Signed   By: Jeronimo Greaves M.D.   On: 11/06/2018 16:15   Dg Chest Port 1 View  Result Date: 11/06/2018 CLINICAL DATA:  Fever and weakness. EXAM: PORTABLE CHEST 1 VIEW COMPARISON:  Cardiac CT dated October 01, 2017. FINDINGS: Mild cardiomegaly. Atherosclerotic calcification of the aortic arch. Pulmonary vascular congestion with diffuse interstitial thickening. No consolidation, pneumothorax, or large pleural effusion. No acute osseous abnormality. IMPRESSION: 1. Cardiomegaly with mild interstitial pulmonary edema. Electronically Signed   By: Obie Dredge M.D.   On: 11/06/2018 15:43    Pending Labs Unresulted Labs (From admission, onward)    Start     Ordered   11/07/18 0500  Basic metabolic panel  Tomorrow morning,   R     11/06/18 1742   11/07/18 0500  CBC  Tomorrow morning,   R     11/06/18 1742   11/06/18 1449  Urine culture  ONCE - STAT,   STAT     11/06/18 1449    11/06/18 1449  Culture, blood (routine x 2)  BLOOD CULTURE X 2,   STAT     11/06/18 1449          Vitals/Pain  Today's Vitals   11/07/18 0008 11/07/18 0100 11/07/18 0200 11/07/18 0300  BP: 132/80 133/76 (!) 119/92 (!) 142/84  Pulse: 90 62 66 71  Resp:  16 18 (!) 25  Temp:      TempSrc:      SpO2:  95% 97% 97%  Weight:      Height:      PainSc:        Isolation Precautions No active isolations  Medications Medications  dofetilide (TIKOSYN) capsule 250 mcg (250 mcg Oral Given 11/07/18 0011)  furosemide (LASIX) tablet 40 mg (has no administration in time range)  isosorbide mononitrate (IMDUR) 24 hr tablet 15 mg (15 mg Oral Given 11/06/18 1843)  metoprolol tartrate (LOPRESSOR) tablet 25 mg (25 mg Oral Given 11/07/18 0008)  midodrine (PROAMATINE) tablet 2.5 mg (has no administration in time range)  sertraline (ZOLOFT) tablet 25 mg (25 mg Oral Given 11/07/18 0011)  pantoprazole (PROTONIX) injection 40 mg (40 mg Intravenous Given 11/07/18 0013)  tamsulosin (FLOMAX) capsule 0.4 mg (0.4 mg Oral Given 11/07/18 0007)  ferrous sulfate tablet 325 mg (has no administration in time range)  gabapentin (NEURONTIN) capsule 100 mg (100 mg Oral Given 11/07/18 0010)  potassium chloride SA (KLOR-CON) CR tablet 20 mEq (20 mEq Oral Given 11/06/18 1838)  albuterol (VENTOLIN HFA) 108 (90 Base) MCG/ACT inhaler 2 puff (has no administration in time range)  mometasone-formoterol (DULERA) 200-5 MCG/ACT inhaler 2 puff (has no administration in time range)  ondansetron (ZOFRAN) tablet 4 mg (has no administration in time range)    Or  ondansetron (ZOFRAN) injection 4 mg (has no administration in time range)  0.9 %  sodium chloride infusion ( Intravenous New Bag/Given 11/06/18 1837)  piperacillin-tazobactam (ZOSYN) IVPB 3.375 g (0 g Intravenous Stopped 11/07/18 0406)  acetaminophen (TYLENOL) tablet 1,000 mg (1,000 mg Oral Given 11/06/18 1508)  sodium chloride 0.9 % bolus 1,000 mL (0 mLs Intravenous Stopped  11/06/18 1642)  iohexol (OMNIPAQUE) 300 MG/ML solution 100 mL (100 mLs Intravenous Contrast Given 11/06/18 1536)  piperacillin-tazobactam (ZOSYN) IVPB 3.375 g (0 g Intravenous Stopped 11/06/18 1819)    Mobility walks with device High fall risk   Focused Assessments Cardiac Assessment Handoff:  Cardiac Rhythm: Normal sinus rhythm No results found for: CKTOTAL, CKMB, CKMBINDEX, TROPONINI No results found for: DDIMER Does the Patient currently have chest pain? Yes      R Recommendations: See Admitting Provider Note  Report given to:   Additional Notes: COVID neg, pt has on his Aspen collar

## 2018-11-07 NOTE — Progress Notes (Addendum)
PROGRESS NOTE    Ronnie Torres  ZOX:[id number] DOB: May 10, 1929 DOA: 11/06/2018 PCP: Jim Like, MD   Brief Narrative: 85 y.o. male, with past medical history significant for GERD, COPD, emphysema and acid reflux disease who acquired COVID-19 in July 2020, treated , presenting today with bloody diarrhea after a period of constipation.  Patient takes Eliquis for A. fib.  Patient had recently a C2 fracture that was repaired at [facility] recently, patient still has a neck brace Patient denies any history of nausea, vomiting, abdominal pain, urinary symptoms.  Family reports weakness for the last 3 to 4 weeks progressively worsening. Work-up in the emergency room showed a hemoglobin of 12.9 with guaiac positive stools. CT scan of the abdomen showed diverticulitis Patient was started on Zosyn CT scan of the abdomen also showed nephrolithiasis and prostatomegaly His 2-hour test for COVID-19 was positive.  Ordered a 12-hour COVID-19 test.  10/12 -dw daughter.  Patient lives at home with his wife per daughter patient has been more progressively getting weaker in the last day or 2 prior to that he was getting physical therapy at home.  He was also covert positive in July 2020 his wife is also covid positive.  Patient has not had a bowel movement since admission to hospital.  He complains of itching around the scrotum.on clear liquid diet.   Assessment & Plan:   Active Problems:   Diverticulitis   #1 diverticulitis -patient admitted with bloody diarrhea after taking laxatives and stool softeners.  According to patient's daughter patient has heme positive stools chronically.  He also takes iron.  He has had multiple colonoscopies and EGDs in [facility] by his Careers adviser.  Patient has had no further bowel movement since admission to hospital.  His hemoglobin and hematocrit has been remained stable.  Continue IV Zosyn and clear liquid diet.  Patient had a temp of 100.8 when he came to the ER and subjective  fevers at home.  So far cultures have been negative.  His hemoglobin on admission is 12 point 9 repeat is 12.3.  Patient is continued on IV fluids.    CT scan of the abdomen Abnormal appearance of the descending/sigmoid colon junction. Soft tissue fullness in the region of multiple diverticula, with areas of gas along the periphery of the descending colonic lumen as detailed above. Considerations include sequelae of diverticulitis, with muscular hypertrophy and residual GI and diverticula. Differential considerations include colocolic fistula. Cannot exclude underlying carcinoma. No convincing evidence of superimposed diverticulitis or colitis.shows evidence of descending colon and sigmoid colon diverticulitis.  Bilateral kidney stones prostatomegaly.  #2 chronic atrial fibrillation on Eliquis which is on hold.  Recently aspirin was added by the PCP for possible carotid stenosis.  Aspirin and Eliquis on hold.  Continue Tikosyn, Lopressor  #3 C2 fracture September 2020 seen at [facility] no surgical treatment done patient was put on a neck brace for a total of 8 to 12 weeks.  #4 history of essential hypertension Imdur Lopressor.  #5 BPH continue Flomax.  #6 tinea cruris start ketoconazole once a day with Diflucan p.o. for 3 days.     Estimated body mass index is 29.96 kg/m as calculated from the following:   Height as of this encounter:  (1.676 m).   Weight as of this encounter: 84.2 kg.  DVT prophylaxis: SCD  code Status: Full code Family Communication: Discussed with daughter who is a Engineer, civil (consulting) here  disposition Plan: Pending clinical improvement follow-up cultures Consultants:   None  Procedures: None  antimicrobials: Zosyn  Subjective: Patient resting in bed complains of scrotal itching no bowel movements this morning or last night  Objective: Vitals:   11/07/18 0300 11/07/18 0522 11/07/18 0838 11/07/18 0912  BP: (!) 142/84 (!) 143/77 123/74   Pulse: 71 84 78 74   Resp: (!) 25 20 20 18   Temp:  98.8 F (37.1 C) 98 F (36.7 C)   TempSrc:  Oral    SpO2: 97% 96% 96% 96%  Weight:  84.2 kg    Height:  5\' 6"  (1.676 m)      Intake/Output Summary (Last 24 hours) at 11/07/2018 1318 Last data filed at 11/07/2018 1144 Gross per 24 hour  Intake 1050 ml  Output 1225 ml  Net -175 ml   Filed Weights   11/06/18 1403 11/07/18 0522  Weight: 82.6 kg 84.2 kg    Examination: Resting with neck brace on  General exam: Appears calm and comfortable  Respiratory system: Clear to auscultation. Respiratory effort normal. Cardiovascular system: S1 & S2 heard, RRR. No JVD, murmurs, rubs, gallops or clicks. No pedal edema. Gastrointestinal system: Abdomen is nondistended, soft and nontender. No organomegaly or masses felt. Normal bowel sounds heard. Central nervous system: Alert and oriented. No focal neurological deficits. Extremities: Trace bilateral lower extremity edema scar on the right knee  skin: No rashes, lesions or ulcers Psychiatry: Judgement and insight appear normal. Mood & affect appropriate.     Data Reviewed: I have personally reviewed following labs and imaging studies  CBC: Recent Labs  Lab 11/06/18 1416 11/07/18 0337  WBC 5.8 4.0  HGB 12.9* 12.3*  HCT 41.1 39.0  MCV 87.8 87.8  PLT 119* 96*   Basic Metabolic Panel: Recent Labs  Lab 11/06/18 1416 11/06/18 1511 11/07/18 0337  NA 141  --  140  K 4.1  --  4.1  CL 107  --  109  CO2 24  --  23  GLUCOSE 95  --  87  BUN 14  --  12  CREATININE 1.21 1.20 1.17  CALCIUM 9.6  --  8.9   GFR: Estimated Creatinine Clearance: 45.3 mL/min (by C-G formula based on SCr of 1.17 mg/dL). Liver Function Tests: Recent Labs  Lab 11/06/18 1416  AST 19  ALT 13  ALKPHOS 61  BILITOT 0.8  PROT 6.0*  ALBUMIN 3.4*   No results for input(s): LIPASE, AMYLASE in the last 168 hours. No results for input(s): AMMONIA in the last 168 hours. Coagulation Profile: Recent Labs  Lab 11/06/18 1416  INR  1.3*   Cardiac Enzymes: No results for input(s): CKTOTAL, CKMB, CKMBINDEX, TROPONINI in the last 168 hours. BNP (last 3 results) No results for input(s): PROBNP in the last 8760 hours. HbA1C: No results for input(s): HGBA1C in the last 72 hours. CBG: No results for input(s): GLUCAP in the last 168 hours. Lipid Profile: No results for input(s): CHOL, HDL, LDLCALC, TRIG, CHOLHDL, LDLDIRECT in the last 72 hours. Thyroid Function Tests: No results for input(s): TSH, T4TOTAL, FREET4, T3FREE, THYROIDAB in the last 72 hours. Anemia Panel: No results for input(s): VITAMINB12, FOLATE, FERRITIN, TIBC, IRON, RETICCTPCT in the last 72 hours. Sepsis Labs: No results for input(s): PROCALCITON, LATICACIDVEN in the last 168 hours.  Recent Results (from the past 240 hour(s))  SARS Coronavirus 2 by RT PCR (hospital order, performed in [facility] hospital lab) Nasopharyngeal Nasopharyngeal Swab     Status: Abnormal   Collection Time: 11/06/18  2:49 PM   Specimen: Nasopharyngeal Swab  Result Value Ref  Range Status   SARS Coronavirus 2 POSITIVE (A) NEGATIVE Final    Comment: RCRV J. KOPP RN, AT [id number] 11/06/18 BY D. VANHOOK (NOTE) If result is NEGATIVE SARS-CoV-2 target nucleic acids are NOT DETECTED. The SARS-CoV-2 RNA is generally detectable in upper and lower  respiratory specimens during the acute phase of infection. The lowest  concentration of SARS-CoV-2 viral copies this assay can detect is 250  copies / mL. A negative result does not preclude SARS-CoV-2 infection  and should not be used as the sole basis for treatment or other  patient management decisions.  A negative result may occur with  improper specimen collection / handling, submission of specimen other  than nasopharyngeal swab, presence of viral mutation(s) within the  areas targeted by this assay, and inadequate number of viral copies  (<250 copies / mL). A negative result must be combined with clinical  observations, patient  history, and epidemiological information. If result is POSITIVE SARS-CoV-2 target nucleic acids are DETECTED. The SARS-CoV-2 RNA is generally detecta ble in upper and lower  respiratory specimens during the acute phase of infection.  Positive  results are indicative of active infection with SARS-CoV-2.  Clinical  correlation with patient history and other diagnostic information is  necessary to determine patient infection status.  Positive results do  not rule out bacterial infection or co-infection with other viruses. If result is PRESUMPTIVE POSTIVE SARS-CoV-2 nucleic acids MAY BE PRESENT.   A presumptive positive result was obtained on the submitted specimen  and confirmed on repeat testing.  While 2019 novel coronavirus  (SARS-CoV-2) nucleic acids may be present in the submitted sample  additional confirmatory testing may be necessary for epidemiological  and / or clinical management purposes  to differentiate between  SARS-CoV-2 and other Sarbecovirus currently known to infect humans.  If clinically indicated additional testing with an alternate test  methodology [phone] is advised. The SARS-CoV-2 RNA is genera lly  detectable in upper and lower respiratory specimens during the acute  phase of infection. The expected result is Negative. Fact Sheet for Patients:  BoilerBrush.com.cyhttps://www.fda.gov/media/136312/download Fact Sheet for Healthcare Providers: https://pope.com/https://www.fda.gov/media/136313/download This test is not yet approved or cleared by the [place] FDA and has been authorized for detection and/or diagnosis of SARS-CoV-2 by FDA under an Emergency Use Authorization (EUA).  This EUA will remain in effect (meaning this test can be used) for the duration of the COVID-19 declaration under Section 564(b)(1) of the Act, 21 U.S.C. section 360bbb-3(b)(1), unless the authorization is terminated or revoked sooner. Performed at [facility], [address] [place], [place] [address]    Culture, blood (routine x 2)     Status: None (Preliminary result)   Collection Time: 11/06/18  3:00 PM   Specimen: BLOOD  Result Value Ref Range Status   Specimen Description BLOOD RIGHT ANTECUBITAL  Final   Special Requests   Final    BOTTLES DRAWN AEROBIC AND ANAEROBIC Blood Culture adequate volume   Culture   Final    NO GROWTH < 24 HOURS Performed at [facility], [address] [place], [place] [address]    Report Status PENDING  Incomplete  Culture, blood (routine x 2)     Status: None (Preliminary result)   Collection Time: 11/06/18  6:48 PM   Specimen: BLOOD LEFT FOREARM  Result Value Ref Range Status   Specimen Description BLOOD LEFT FOREARM  Final   Special Requests   Final    BOTTLES DRAWN AEROBIC AND ANAEROBIC  Blood Culture results may not be optimal due to an excessive volume of blood received in culture bottles   Culture   Final    NO GROWTH < 12 HOURS Performed at [facility], [address], [place], [place] [address]    Report Status PENDING  Incomplete  SARS CORONAVIRUS 2 (TAT 6-24 HRS) Nasopharyngeal Nasopharyngeal Swab     Status: None   Collection Time: 11/06/18  8:45 PM   Specimen: Nasopharyngeal Swab  Result Value Ref Range Status   SARS Coronavirus 2 NEGATIVE NEGATIVE Final    Comment: (NOTE) SARS-CoV-2 target nucleic acids are NOT DETECTED. The SARS-CoV-2 RNA is generally detectable in upper and lower respiratory specimens during the acute phase of infection. Negative results do not preclude SARS-CoV-2 infection, do not rule out co-infections with other pathogens, and should not be used as the sole basis for treatment or other patient management decisions. Negative results must be combined with clinical observations, patient history, and epidemiological information. The expected result is Negative. Fact Sheet for Patients: HairSlick.no Fact Sheet for Healthcare Providers:  quierodirigir.com This test is not yet approved or cleared by the [place] FDA and  has been authorized for detection and/or diagnosis of SARS-CoV-2 by FDA under an Emergency Use Authorization (EUA). This EUA will remain  in effect (meaning this test can be used) for the duration of the COVID-19 declaration under Section 56 4(b)(1) of the Act, 21 U.S.C. section 360bbb-3(b)(1), unless the authorization is terminated or revoked sooner. Performed at [facility], [address], [place], [place] [address]          Radiology Studies: Ct Abdomen Pelvis W Contrast  Result Date: 11/06/2018 CLINICAL DATA:  Weakness and possible bloody stool since last night. Fall 1 month ago with C2 fracture. On blood thinner. EXAM: CT ABDOMEN AND PELVIS WITH CONTRAST TECHNIQUE: Multidetector CT imaging of the abdomen and pelvis was performed using the standard protocol following bolus administration of intravenous contrast. CONTRAST:  OMNIPAQUE IOHEXOL 300 MG/ML  SOLN COMPARISON:  None. FINDINGS: Lower chest: Bibasilar scarring. Mild cardiomegaly, without pericardial or pleural effusion. Moderate hiatal hernia. Hepatobiliary: Hepatic cysts and too small to characterize lesions. Normal gallbladder, without biliary ductal dilatation. Pancreas: Normal, without mass or ductal dilatation. Spleen: Normal in size, without focal abnormality. Adrenals/Urinary Tract: Normal adrenal glands. Renal cysts of up to 4.6 cm. Other renal lesions which are too small to characterize. A 5 mm lower pole left renal collecting system calculus. Punctate right renal collecting system stone. No hydronephrosis. Normal urinary bladder. Stomach/Bowel: Normal remainder of the stomach. Abnormal appearance of the descending/sigmoid junction. Soft tissue fullness in the region of multiple diverticula, including on 59/3 and 65/6. Apparent luminal outpouchings both laterally on coronal image 66 and medially,  towards the sigmoid, including on 64/6. No convincing evidence of surrounding inflammation. Normal terminal ileum and appendix.  Normal small bowel. Vascular/Lymphatic: Aortic and branch vessel atherosclerosis. No abdominopelvic adenopathy. Reproductive: Mild prostatomegaly. Other: No significant free fluid.  No free intraperitoneal air. Musculoskeletal: Thoracolumbar spondylosis. Intramuscular lipoma within the right gluteal is a 5.7 cm. IMPRESSION: 1. Abnormal appearance of the descending/sigmoid colon junction. Soft tissue fullness in the region of multiple diverticula, with areas of gas along the periphery of the descending colonic lumen as detailed above. Considerations include sequelae of diverticulitis, with muscular hypertrophy and residual GI and diverticula. Differential considerations include colocolic fistula. Cannot exclude underlying carcinoma. No convincing evidence of superimposed diverticulitis or colitis. 2. Moderate hiatal hernia. 3.  Aortic Atherosclerosis (ICD10-I70.0). 4. Prostatomegaly. 5. Bilateral nephrolithiasis. Electronically Signed   By: Jeronimo Greaves M.D.   On: 11/06/2018 16:15   Dg Chest Port 1 View  Result Date: 11/06/2018 CLINICAL DATA:  Fever and weakness. EXAM: PORTABLE CHEST 1 VIEW COMPARISON:  Cardiac CT dated October 01, 2017. FINDINGS: Mild cardiomegaly. Atherosclerotic calcification of the aortic arch. Pulmonary vascular congestion with diffuse interstitial thickening. No consolidation, pneumothorax, or large pleural effusion. No acute osseous abnormality. IMPRESSION: 1. Cardiomegaly with mild interstitial pulmonary edema. Electronically Signed   By: Obie Dredge M.D.   On: 11/06/2018 15:43        Scheduled Meds: . dofetilide  250 mcg Oral BID  . ferrous sulfate  325 mg Oral Daily  . gabapentin  100 mg Oral BID  . isosorbide mononitrate  15 mg Oral Daily  . metoprolol tartrate  25 mg Oral BID  . midodrine  2.5 mg Oral BID WC  . mometasone-formoterol  2  puff Inhalation BID  . pantoprazole (PROTONIX) IV  40 mg Intravenous Q12H  . potassium chloride SA  20 mEq Oral Daily  . sertraline  25 mg Oral QHS  . tamsulosin  0.4 mg Oral BID   Continuous Infusions: . sodium chloride 50 mL/hr at 11/07/18 0742  . piperacillin-tazobactam (ZOSYN)  IV 3.375 g (11/07/18 1005)     LOS: 1 day    Alwyn Ren, MD [facility]  If 7PM-7AM, please contact night-coverage [email or web address] Password [email or web address] 11/07/2018, 1:18 PM

## 2018-11-07 NOTE — Consult Note (Signed)
Ronnie Torres, is an 85yo M with history of recovered from COVID-19 in July. He presents with abdominal pain, bloody stool, and imaging concerning for diverticulitis  Since he had COVID within the last 3 months, there is a chance that his PCR could still be positive, BUT NOT INFECTIOUS.  He had initial test(CEPHEID) which was positive with CT Value of 42.3. Any values >34 is considered to be non-viable/non-infectious virus He had repeat testing since which was negative on the same day, (hologic)  Patient does not have symptoms to suggest reinfection with covid-19   For admission: He needs to be on standard isolation (NOT airborne/contact) For procedure, do not need to retest. He is not considered infectious, and has recovered from covid-19.  If questions, Please contact me for further IP recommendations,  Ronnie Torres for Infectious Diseases [phone]

## 2018-11-08 LAB — COMPREHENSIVE METABOLIC PANEL
ALT: 15 U/L (ref 0–44)
AST: 18 U/L (ref 15–41)
Albumin: 3.1 g/dL — ABNORMAL LOW (ref 3.5–5.0)
Alkaline Phosphatase: 56 U/L (ref 38–126)
Anion gap: 12 (ref 5–15)
BUN: 10 mg/dL (ref 8–23)
CO2: 25 mmol/L (ref 22–32)
Calcium: 9.4 mg/dL (ref 8.9–10.3)
Chloride: 104 mmol/L (ref 98–111)
Creatinine, Ser: 1.34 mg/dL — ABNORMAL HIGH (ref 0.61–1.24)
GFR calc Af Amer: 55 mL/min — ABNORMAL LOW (ref >60)
GFR calc non Af Amer: 47 mL/min — ABNORMAL LOW (ref >60)
Glucose, Bld: 92 mg/dL (ref 70–99)
Potassium: 4.3 mmol/L (ref 3.5–5.1)
Sodium: 141 mmol/L (ref 135–145)
Total Bilirubin: 0.9 mg/dL (ref 0.3–1.2)
Total Protein: 5.8 g/dL — ABNORMAL LOW (ref 6.5–8.1)

## 2018-11-08 LAB — CBC WITH DIFFERENTIAL/PLATELET
Abs Immature Granulocytes: 0.02 10*3/uL (ref 0.00–0.07)
Basophils Absolute: 0 10*3/uL (ref 0.0–0.1)
Basophils Relative: 0 %
Eosinophils Absolute: 0.1 10*3/uL (ref 0.0–0.5)
Eosinophils Relative: 3 %
HCT: 38.4 % — ABNORMAL LOW (ref 39.0–52.0)
Hemoglobin: 12.9 g/dL — ABNORMAL LOW (ref 13.0–17.0)
Immature Granulocytes: 0 %
Lymphocytes Relative: 18 %
Lymphs Abs: 0.9 10*3/uL (ref 0.7–4.0)
MCH: 28.4 pg (ref 26.0–34.0)
MCHC: 33.6 g/dL (ref 30.0–36.0)
MCV: 84.4 fL (ref 80.0–100.0)
Monocytes Absolute: 0.5 10*3/uL (ref 0.1–1.0)
Monocytes Relative: 10 %
Neutro Abs: 3.4 10*3/uL (ref 1.7–7.7)
Neutrophils Relative %: 69 %
Platelets: 106 10*3/uL — ABNORMAL LOW (ref 150–400)
RBC: 4.55 MIL/uL (ref 4.22–5.81)
RDW: 15.1 % (ref 11.5–15.5)
WBC: 4.9 10*3/uL (ref 4.0–10.5)
nRBC: 0 % (ref 0.0–0.2)

## 2018-11-08 MED ORDER — POTASSIUM CHLORIDE CRYS ER 20 MEQ PO TBCR: 20.0000 meq | EXTENDED_RELEASE_TABLET | Freq: Every day | ORAL | 3 refills | Status: DC

## 2018-11-08 MED ORDER — CIPROFLOXACIN HCL 250 MG PO TABS: 250.0000 mg | ORAL_TABLET | Freq: Two times a day (BID) | ORAL | 0 refills | Status: AC

## 2018-11-08 MED ORDER — FUROSEMIDE 40 MG PO TABS: 40.0000 mg | ORAL_TABLET | ORAL | 2 refills | Status: DC

## 2018-11-08 MED ORDER — CIPROFLOXACIN HCL 250 MG PO TABS: 250.0000 mg | ORAL_TABLET | Freq: Two times a day (BID) | ORAL | 0 refills | Status: DC

## 2018-11-08 MED ORDER — METRONIDAZOLE 500 MG PO TABS: 500.0000 mg | ORAL_TABLET | Freq: Three times a day (TID) | ORAL | 0 refills | Status: AC

## 2018-11-08 MED ORDER — ONDANSETRON HCL 4 MG PO TABS: 4.0000 mg | ORAL_TABLET | Freq: Four times a day (QID) | ORAL | 0 refills | Status: DC | PRN

## 2018-11-08 MED ORDER — METRONIDAZOLE 500 MG PO TABS: 500.0000 mg | ORAL_TABLET | Freq: Three times a day (TID) | ORAL | 0 refills | Status: DC

## 2018-11-08 NOTE — Evaluation (Signed)
Physical Therapy Evaluation Patient Details Name: Ronnie Torres MRN: [id number] DOB: 01-Jan-1930 Today's Date: 11/08/2018   History of Present Illness  Melody Savidge  is a 85 y.o. male, with past medical history significant for GERD, COPD, emphysema and acid reflux disease who acquired COVID-19 in July 2020, treated , presenting today with bloody diarrhea after a period of constipation.  Clinical Impression  Pt admitted with above. Pt functioning at baseline at min guard with use of RW. Pt extremely HOH contributing to some delayed processing and command follow. Pt with good home set up and support. Acute PT to cont to follow.    Follow Up Recommendations No PT follow up;Supervision/Assistance - 24 hour(at baseline)    Equipment Recommendations  None recommended by PT    Recommendations for Other Services       Precautions / Restrictions Precautions Precautions: Fall Precaution Comments: has a C2 fracture from previous injury, was addressed at [facility] - no-op, c-collar for 8-12 weeks Required Braces or Orthoses: Cervical Brace Cervical Brace: Hard collar;At all times Restrictions Weight Bearing Restrictions: No      Mobility  Bed Mobility               General bed mobility comments: pt received up in chair. per chart pt was found standing at beside, assuming pt is mod I with bed mobility  Transfers Overall transfer level: Needs assistance Equipment used: None Transfers: Sit to/from Stand Sit to Stand: Min guard         General transfer comment: min guard for safety  Ambulation/Gait Ambulation/Gait assistance: Min guard Gait Distance (Feet): 100 Feet Assistive device: Rolling walker (2 wheeled) Gait Pattern/deviations: Decreased stride length;Step-through pattern;Trunk flexed Gait velocity: dec Gait velocity interpretation: <1.31 ft/sec, indicative of household ambulator General Gait Details: pt with one standing rest break, minA for walker management around  obstacles in hallway  Stairs            Wheelchair Mobility    Modified Rankin (Stroke Patients Only)       Balance Overall balance assessment: Needs assistance Sitting-balance support: Feet supported;No upper extremity supported Sitting balance-Leahy Scale: Good     Standing balance support: No upper extremity supported Standing balance-Leahy Scale: Fair Standing balance comment: requires RW for safe ambulation                             Pertinent Vitals/Pain Pain Assessment: No/denies pain    Home Living Family/patient expects to be discharged to:: Private residence Living Arrangements: Spouse/significant other Available Help at Discharge: Family;Available 24 hours/day Type of Home: House Home Access: Stairs to enter Entrance Stairs-Rails: Right Entrance Stairs-Number of Steps: 5 Home Layout: One level Home Equipment: Environmental consultant - 2 wheels      Prior Function Level of Independence: Needs assistance   Gait / Transfers Assistance Needed: uses RW for amb  ADL's / Homemaking Assistance Needed: wife gives sponge bath at sink in bathroom, doesn't get in tub        Hand Dominance   Dominant Hand: Right    Extremity/Trunk Assessment   Upper Extremity Assessment Upper Extremity Assessment: Generalized weakness(appropriate for age)    Lower Extremity Assessment Lower Extremity Assessment: Generalized weakness(appropriate for age)    Cervical / Trunk Assessment Cervical / Trunk Assessment: Kyphotic(pt also with c-collar from previous injury)  Communication   Communication: HOH(extremely hard of hearing)  Cognition Arousal/Alertness: Awake/alert Behavior During Therapy: WFL for tasks assessed/performed Overall Cognitive Status:  Within Functional Limits for tasks assessed                                 General Comments: appears to be delayed however suspect this to be due to being extremely [facility]      General Comments General  comments (skin integrity, edema, etc.): VSS    Exercises     Assessment/Plan    PT Assessment Patient needs continued PT services  PT Problem List Decreased strength;Decreased range of motion;Decreased activity tolerance;Decreased balance;Decreased coordination;Decreased mobility;Decreased cognition       PT Treatment Interventions DME instruction;Gait training;Stair training;Functional mobility training;Therapeutic activities;Neuromuscular re-education;Balance training;Therapeutic exercise    PT Goals (Current goals can be found in the Care Plan section)  Acute Rehab PT Goals Patient Stated Goal: home today PT Goal Formulation: With patient Time For Goal Achievement: 11/22/18 Potential to Achieve Goals: Good    Frequency Min 3X/week   Barriers to discharge        Co-evaluation               AM-PAC PT "6 Clicks" Mobility  Outcome Measure Help needed turning from your back to your side while in a flat bed without using bedrails?: None Help needed moving from lying on your back to sitting on the side of a flat bed without using bedrails?: None Help needed moving to and from a bed to a chair (including a wheelchair)?: A Little Help needed standing up from a chair using your arms (e.g., wheelchair or bedside chair)?: A Little Help needed to walk in hospital room?: A Little Help needed climbing 3-5 steps with a railing? : A Lot 6 Click Score: 19    End of Session Equipment Utilized During Treatment: Gait belt Activity Tolerance: Patient tolerated treatment well Patient left: in chair;with call bell/phone within reach Nurse Communication: Mobility status PT Visit Diagnosis: Unsteadiness on feet (R26.81);Difficulty in walking, not elsewhere classified (R26.2)    Time: 9417-4081 PT Time Calculation (min) (ACUTE ONLY): 19 min   Charges:   PT Evaluation $PT Eval Low Complexity: 1 Low          Lewis Shock, PT, DPT Acute Rehabilitation Services Pager #:  [phone] Office #: [phone]   Iona Hansen 11/08/2018, 1:59 PM

## 2018-11-08 NOTE — Progress Notes (Signed)
Pt dc home, sister was at bedside and left, son taking pt home, reviewed instructions, instructed that GI and Fam med will call for appt, reviewed meds, all questions entertained and answered. Pt dc home in stablecondition

## 2018-11-08 NOTE — Progress Notes (Addendum)
Patient appears disoriented to place and situation. Patient has needed to linen changes. Staff found patient standing at bedside. He reported the bed goes up by itself. He stated he was startled and wet himself.   Reassured patient. Placed bed alarm.   Paged [facility] to inform of mental status change.

## 2018-11-08 NOTE — Discharge Summary (Signed)
Physician Discharge Summary  Ronnie Torres YSA:[id number] DOB: April 26, 1929 DOA: 11/06/2018  PCP: Redmond Pulling, MD  Admit date: 11/06/2018 Discharge date: 11/08/2018  Admitted From: home Disposition:home Recommendations for Outpatient Follow-up:  1. Follow up with PCP in 1-2 weeks 2. Please obtain BMP/CBC in one week  Home Health:none Equipment/Devices: None Discharge Condition: Stable CODE STATUS full code Diet recommendation: Cardiac soft diet Brief/Interim Summary:85 y.o.male,with past medical history significant for GERD, COPD, emphysema and acid reflux disease who acquired COVID-19 in July 2020, treated, presenting today with bloody diarrhea after aperiod of constipation.Patient takes Eliquis for A. fib. Patient had recently a C2 fracture that was repaired at [facility] recently, patient still has a neck brace Patient denies any history of nausea, vomiting, abdominal pain, urinary symptoms. Family reports weakness for the last 3 to 4 weeks progressively worsening. Work-up in the emergency roomshowed a hemoglobin of 12.9 with guaiac positive stools. CT scan of the abdomen showed diverticulitis Patient was started on Zosyn CT scan of the abdomen also showed nephrolithiasis and prostatomegaly His 2-hour test for COVID-19 was positive. Ordered a 12-hour COVID-19 test.  Discharge Diagnoses:  Active Problems:   Diverticulitis   Rectal bleeding   COVID-19   Acute diverticulitis  #1 diverticulitis -patient admitted with bloody diarrhea after taking laxatives and stool softeners.  According to patient's daughter patient has heme positive stools chronically.  He also takes iron.  He has had multiple colonoscopies and EGDs in [facility] by his Psychologist, sport and exercise.  Patient has had no further bloody bowel movement since admission to hospital.  His hemoglobin and hematocrit has been remained stable.  Hemoglobin on the day of discharge is 12.9.  Admission hemoglobin was also 12.9.  He was treated  with Zosyn.  He tolerated a clear liquid diet.  Advance to full liquid diet today.  He remains afebrile with normal white count.  He denies abdominal pain today.  He will be discharged home on Cipro and Flagyl for 7 days.  Blood culture so far is negative.  Urine culture unremarkable.     CT scan of the abdomen Abnormal appearance of the descending/sigmoid colon junction. Soft tissue fullness in the region of multiple diverticula, with areas of gas along the periphery of the descending colonic lumen as detailed above. Considerations include sequelae of diverticulitis, with muscular hypertrophy and residual GI and diverticula. Differential considerations include colocolic fistula. Cannot exclude underlying carcinoma. No convincing evidence of superimposed diverticulitis or colitis.shows evidence of descending colon and sigmoid colon diverticulitis.  Bilateral kidney stones prostatomegaly.  #2 chronic atrial fibrillation on Eliquis which is on hold.  Recently aspirin was added by the PCP for possible carotid stenosis.  Aspirin and Eliquis on hold.  Continue Tikosyn, Lopressor  #3 C2 fracture September 2020 seen at [facility] no surgical treatment done patient was put on a neck brace for a total of 8 to 12 weeks.  #4 history of essential hypertension Imdur Lopressor.  #5 BPH continue Flomax.  #6 tinea cruris start ketoconazole once a day with Diflucan p.o. for 3 days   Estimated body mass index is 29.18 kg/m as calculated from the following:   Height as of this encounter: 5\' 6"  (1.676 m).   Weight as of this encounter: 82 kg.  Discharge Instructions  Discharge Instructions    Call MD for:  difficulty breathing, headache or visual disturbances   Complete by: As directed    Call MD for:  persistant nausea and vomiting   Complete by: As directed    Call  MD for:  redness, tenderness, or signs of infection (pain, swelling, redness, odor or green/yellow discharge around incision site)    Complete by: As directed    Diet - low sodium heart healthy   Complete by: As directed    Increase activity slowly   Complete by: As directed      Allergies as of 11/08/2018      Reactions   Shellfish Allergy Hives   Tape Other (See Comments)   "takes skin off"   Sulfa Antibiotics Rash   Trazodone And Nefazodone Rash      Medication List    TAKE these medications   albuterol 108 (90 Base) MCG/ACT inhaler Commonly known as: VENTOLIN HFA Inhale 2 puffs into the lungs every 4 (four) hours as needed for wheezing or shortness of breath.   apixaban 5 MG Tabs tablet Commonly known as: ELIQUIS Take 1 tablet (5 mg total) by mouth 2 (two) times daily.   ciprofloxacin 250 MG tablet Commonly known as: CIPRO Take 1 tablet (250 mg total) by mouth 2 (two) times daily for 7 days.   dofetilide 250 MCG capsule Commonly known as: TIKOSYN Take 1 capsule by mouth twice daily   ferrous sulfate 324 (65 Fe) MG Tbec Take 1 tablet by mouth daily.   furosemide 40 MG tablet Commonly known as: LASIX Take 1 tablet (40 mg total) by mouth every morning. Start taking it 10/17 What changed: additional instructions   gabapentin 100 MG capsule Commonly known as: NEURONTIN Take 100 mg by mouth 2 (two) times daily.   isosorbide mononitrate 30 MG 24 hr tablet Commonly known as: IMDUR TAKE ONE-HALF TABLET BY  MOUTH DAILY   metoprolol tartrate 25 MG tablet Commonly known as: LOPRESSOR Take 1 tablet (25 mg total) by mouth 2 (two) times daily.   metroNIDAZOLE 500 MG tablet Commonly known as: Flagyl Take 1 tablet (500 mg total) by mouth 3 (three) times daily for 14 days.   omeprazole 20 MG capsule Commonly known as: PRILOSEC Take 20 mg by mouth 2 (two) times daily.   ondansetron 4 MG tablet Commonly known as: ZOFRAN Take 1 tablet (4 mg total) by mouth every 6 (six) hours as needed for nausea.   potassium chloride SA 20 MEQ tablet Commonly known as: KLOR-CON Take 1 tablet (20 mEq total) by  mouth daily. Start taking it 10/17 What changed: additional instructions   Senokot S 8.6-50 MG tablet Generic drug: senna-docusate Take 1 tablet by mouth at bedtime as needed for mild constipation.   sertraline 25 MG tablet Commonly known as: ZOLOFT Take 25 mg by mouth at bedtime.   Stopain 8 % Liqd Generic drug: Menthol (Topical Analgesic) Apply 1 application topically daily as needed (joint pain).   Symbicort 160-4.5 MCG/ACT inhaler Generic drug: budesonide-formoterol Inhale 2 puffs into the lungs 2 (two) times daily.   tamsulosin 0.4 MG Caps capsule Commonly known as: FLOMAX Take 0.4 mg by mouth 2 (two) times daily.      Follow-up Information    Jim Like, MD Follow up.   Specialty: Family Medicine Contact information: [address] [place] [address] [phone]        Regan Lemming, MD .   Specialty: Cardiology Contact information: [address] [place] [address] [place] [address] [phone]        Hilarie Fredrickson, MD Follow up.   Specialty: Gastroenterology Contact information: [address] [place] [address] [phone]  Allergies  Allergen Reactions  . Shellfish Allergy Hives  . Tape Other (See Comments)    "takes skin off"  . Sulfa Antibiotics Rash  . Trazodone And Nefazodone Rash    Consultations: None  Procedures/Studies: Ct Abdomen Pelvis W Contrast  Result Date: 11/06/2018 CLINICAL DATA:  Weakness and possible bloody stool since last night. Fall 1 month ago with C2 fracture. On blood thinner. EXAM: CT ABDOMEN AND PELVIS WITH CONTRAST TECHNIQUE: Multidetector CT imaging of the abdomen and pelvis was performed using the standard protocol following bolus administration of intravenous contrast. CONTRAST:  OMNIPAQUE IOHEXOL 300 MG/ML  SOLN COMPARISON:  None. FINDINGS: Lower chest: Bibasilar scarring. Mild cardiomegaly, without pericardial or pleural effusion. Moderate hiatal hernia.  Hepatobiliary: Hepatic cysts and too small to characterize lesions. Normal gallbladder, without biliary ductal dilatation. Pancreas: Normal, without mass or ductal dilatation. Spleen: Normal in size, without focal abnormality. Adrenals/Urinary Tract: Normal adrenal glands. Renal cysts of up to 4.6 cm. Other renal lesions which are too small to characterize. A 5 mm lower pole left renal collecting system calculus. Punctate right renal collecting system stone. No hydronephrosis. Normal urinary bladder. Stomach/Bowel: Normal remainder of the stomach. Abnormal appearance of the descending/sigmoid junction. Soft tissue fullness in the region of multiple diverticula, including on 59/3 and 65/6. Apparent luminal outpouchings both laterally on coronal image 66 and medially, towards the sigmoid, including on 64/6. No convincing evidence of surrounding inflammation. Normal terminal ileum and appendix.  Normal small bowel. Vascular/Lymphatic: Aortic and branch vessel atherosclerosis. No abdominopelvic adenopathy. Reproductive: Mild prostatomegaly. Other: No significant free fluid.  No free intraperitoneal air. Musculoskeletal: Thoracolumbar spondylosis. Intramuscular lipoma within the right gluteal is a 5.7 cm. IMPRESSION: 1. Abnormal appearance of the descending/sigmoid colon junction. Soft tissue fullness in the region of multiple diverticula, with areas of gas along the periphery of the descending colonic lumen as detailed above. Considerations include sequelae of diverticulitis, with muscular hypertrophy and residual GI and diverticula. Differential considerations include colocolic fistula. Cannot exclude underlying carcinoma. No convincing evidence of superimposed diverticulitis or colitis. 2. Moderate hiatal hernia. 3.  Aortic Atherosclerosis (ICD10-I70.0). 4. Prostatomegaly. 5. Bilateral nephrolithiasis. Electronically Signed   By: Jeronimo Greaves M.D.   On: 11/06/2018 16:15   Dg Chest Port 1 View  Result Date:  11/06/2018 CLINICAL DATA:  Fever and weakness. EXAM: PORTABLE CHEST 1 VIEW COMPARISON:  Cardiac CT dated October 01, 2017. FINDINGS: Mild cardiomegaly. Atherosclerotic calcification of the aortic arch. Pulmonary vascular congestion with diffuse interstitial thickening. No consolidation, pneumothorax, or large pleural effusion. No acute osseous abnormality. IMPRESSION: 1. Cardiomegaly with mild interstitial pulmonary edema. Electronically Signed   By: Obie Dredge M.D.   On: 11/06/2018 15:43    (Echo, Carotid, EGD, Colonoscopy, ERCP)    Subjective:  Patient sitting up in chair daughter by the bedside discussed with the daughter in detail patient anxious to go home no further bloody bowel movements in fact he has not had a bowel movement today or yesterday.  Discharge Exam: Vitals:   11/08/18 0730 11/08/18 0857  BP:  (!) 126/59  Pulse:  95  Resp: 18 20  Temp:  98.4 F (36.9 C)  SpO2:  95%   Vitals:   11/08/18 0113 11/08/18 0342 11/08/18 0730 11/08/18 0857  BP: 132/80 (!) 150/77  (!) 126/59  Pulse: 70 74  95  Resp: 16 18 18 20   Temp: 98.2 F (36.8 C) 98.2 F (36.8 C)  98.4 F (36.9 C)  TempSrc: Oral Oral  Oral  SpO2:  95% 99%  95%  Weight:  82 kg    Height:        General: Pt is alert, awake, not in acute distress Cardiovascular: RRR, S1/S2 +, no rubs, no gallops Respiratory: CTA bilaterally, no wheezing, no rhonchi Abdominal: Soft, NT, ND, bowel sounds + Extremities: no edema, no cyanosis    The results of significant diagnostics from this hospitalization (including imaging, microbiology, ancillary and laboratory) are listed below for reference.     Microbiology: Recent Results (from the past 240 hour(s))  Urine culture     Status: Abnormal   Collection Time: 11/06/18  2:49 PM   Specimen: Urine, Clean Catch  Result Value Ref Range Status   Specimen Description URINE, CLEAN CATCH  Final   Special Requests NONE  Final   Culture (A)  Final    <10,000 COLONIES/mL  INSIGNIFICANT GROWTH Performed at [facility], [address], [place], [place] [address]    Report Status 11/07/2018 FINAL  Final  SARS Coronavirus 2 by RT PCR (hospital order, performed in [facility] hospital lab) Nasopharyngeal Nasopharyngeal Swab     Status: Abnormal   Collection Time: 11/06/18  2:49 PM   Specimen: Nasopharyngeal Swab  Result Value Ref Range Status   SARS Coronavirus 2 POSITIVE (A) NEGATIVE Final    Comment: RCRV J. KOPP RN, AT 4034 11/06/18 BY D. VANHOOK (NOTE) If result is NEGATIVE SARS-CoV-2 target nucleic acids are NOT DETECTED. The SARS-CoV-2 RNA is generally detectable in upper and lower  respiratory specimens during the acute phase of infection. The lowest  concentration of SARS-CoV-2 viral copies this assay can detect is 250  copies / mL. A negative result does not preclude SARS-CoV-2 infection  and should not be used as the sole basis for treatment or other  patient management decisions.  A negative result may occur with  improper specimen collection / handling, submission of specimen other  than nasopharyngeal swab, presence of viral mutation(s) within the  areas targeted by this assay, and inadequate number of viral copies  (<250 copies / mL). A negative result must be combined with clinical  observations, patient history, and epidemiological information. If result is POSITIVE SARS-CoV-2 target nucleic acids are DETECTED. The SARS-CoV-2 RNA is generally detecta ble in upper and lower  respiratory specimens during the acute phase of infection.  Positive  results are indicative of active infection with SARS-CoV-2.  Clinical  correlation with patient history and other diagnostic information is  necessary to determine patient infection status.  Positive results do  not rule out bacterial infection or co-infection with other viruses. If result is PRESUMPTIVE POSTIVE SARS-CoV-2 nucleic acids MAY BE PRESENT.   A presumptive positive result was  obtained on the submitted specimen  and confirmed on repeat testing.  While 2019 novel coronavirus  (SARS-CoV-2) nucleic acids may be present in the submitted sample  additional confirmatory testing may be necessary for epidemiological  and / or clinical management purposes  to differentiate between  SARS-CoV-2 and other Sarbecovirus currently known to infect humans.  If clinically indicated additional testing with an alternate test  methodology [phone]) is advised. The SARS-CoV-2 RNA is genera lly  detectable in upper and lower respiratory specimens during the acute  phase of infection. The expected result is Negative. Fact Sheet for Patients:  BoilerBrush.com.cy Fact Sheet for Healthcare Providers: https://pope.com/ This test is not yet approved or cleared by the [place] FDA and has been authorized for detection and/or diagnosis of SARS-CoV-2 by FDA  under an Emergency Use Authorization (EUA).  This EUA will remain in effect (meaning this test can be used) for the duration of the COVID-19 declaration under Section 564(b)(1) of the Act, 21 U.S.C. section 360bbb-3(b)(1), unless the authorization is terminated or revoked sooner. Performed at [facility], [address], [place], [place] [address]   Culture, blood (routine x 2)     Status: None (Preliminary result)   Collection Time: 11/06/18  3:00 PM   Specimen: BLOOD  Result Value Ref Range Status   Specimen Description BLOOD RIGHT ANTECUBITAL  Final   Special Requests   Final    BOTTLES DRAWN AEROBIC AND ANAEROBIC Blood Culture adequate volume   Culture   Final    NO GROWTH < 24 HOURS Performed at [facility], [address] [place], [place] [address]    Report Status PENDING  Incomplete  Culture, blood (routine x 2)     Status: None (Preliminary result)   Collection Time: 11/06/18  6:48 PM   Specimen: BLOOD LEFT FOREARM  Result Value Ref Range Status    Specimen Description BLOOD LEFT FOREARM  Final   Special Requests   Final    BOTTLES DRAWN AEROBIC AND ANAEROBIC Blood Culture results may not be optimal due to an excessive volume of blood received in culture bottles   Culture   Final    NO GROWTH < 12 HOURS Performed at [facility], [address] [place], [place] [address]    Report Status PENDING  Incomplete  SARS CORONAVIRUS 2 (TAT 6-24 HRS) Nasopharyngeal Nasopharyngeal Swab     Status: None   Collection Time: 11/06/18  8:45 PM   Specimen: Nasopharyngeal Swab  Result Value Ref Range Status   SARS Coronavirus 2 NEGATIVE NEGATIVE Final    Comment: (NOTE) SARS-CoV-2 target nucleic acids are NOT DETECTED. The SARS-CoV-2 RNA is generally detectable in upper and lower respiratory specimens during the acute phase of infection. Negative results do not preclude SARS-CoV-2 infection, do not rule out co-infections with other pathogens, and should not be used as the sole basis for treatment or other patient management decisions. Negative results must be combined with clinical observations, patient history, and epidemiological information. The expected result is Negative. Fact Sheet for Patients: HairSlick.no Fact Sheet for Healthcare Providers: quierodirigir.com This test is not yet approved or cleared by the [place] FDA and  has been authorized for detection and/or diagnosis of SARS-CoV-2 by FDA under an Emergency Use Authorization (EUA). This EUA will remain  in effect (meaning this test can be used) for the duration of the COVID-19 declaration under Section 56 4(b)(1) of the Act, 21 U.S.C. section 360bbb-3(b)(1), unless the authorization is terminated or revoked sooner. Performed at [facility], [address], [place], [place] [address]      Labs: BNP (last 3 results) No results for input(s): BNP in the last 8760 hours. Basic Metabolic Panel: Recent  Labs  Lab 11/06/18 1416 11/06/18 1511 11/07/18 0337 11/08/18 0425  NA 141  --  140 141  K 4.1  --  4.1 4.3  CL 107  --  109 104  CO2 24  --  23 25  GLUCOSE 95  --  87 92  BUN 14  --  12 10  CREATININE 1.21 1.20 1.17 1.34*  CALCIUM 9.6  --  8.9 9.4   Liver Function Tests: Recent Labs  Lab 11/06/18 1416 11/08/18 0425  AST 19 18  ALT 13  15  ALKPHOS 61 56  BILITOT 0.8 0.9  PROT 6.0* 5.8*  ALBUMIN 3.4* 3.1*   No results for input(s): LIPASE, AMYLASE in the last 168 hours. No results for input(s): AMMONIA in the last 168 hours. CBC: Recent Labs  Lab 11/06/18 1416 11/07/18 0337 11/08/18 0425  WBC 5.8 4.0 4.9  NEUTROABS  --   --  3.4  HGB 12.9* 12.3* 12.9*  HCT 41.1 39.0 38.4*  MCV 87.8 87.8 84.4  PLT 119* 96* 106*   Cardiac Enzymes: No results for input(s): CKTOTAL, CKMB, CKMBINDEX, TROPONINI in the last 168 hours. BNP: Invalid input(s): POCBNP CBG: No results for input(s): GLUCAP in the last 168 hours. D-Dimer No results for input(s): DDIMER in the last 72 hours. Hgb A1c No results for input(s): HGBA1C in the last 72 hours. Lipid Profile No results for input(s): CHOL, HDL, LDLCALC, TRIG, CHOLHDL, LDLDIRECT in the last 72 hours. Thyroid function studies No results for input(s): TSH, T4TOTAL, T3FREE, THYROIDAB in the last 72 hours.  Invalid input(s): FREET3 Anemia work up No results for input(s): VITAMINB12, FOLATE, FERRITIN, TIBC, IRON, RETICCTPCT in the last 72 hours. Urinalysis    Component Value Date/Time   COLORURINE YELLOW 11/06/2018 1614   APPEARANCEUR CLEAR 11/06/2018 1614   LABSPEC 1.019 11/06/2018 1614   PHURINE 6.0 11/06/2018 1614   GLUCOSEU NEGATIVE 11/06/2018 1614   HGBUR NEGATIVE 11/06/2018 1614   BILIRUBINUR NEGATIVE 11/06/2018 1614   KETONESUR NEGATIVE 11/06/2018 1614   PROTEINUR NEGATIVE 11/06/2018 1614   NITRITE NEGATIVE 11/06/2018 1614   LEUKOCYTESUR NEGATIVE 11/06/2018 1614   Sepsis Labs Invalid input(s): PROCALCITONIN,  WBC,   LACTICIDVEN Microbiology Recent Results (from the past 240 hour(s))  Urine culture     Status: Abnormal   Collection Time: 11/06/18  2:49 PM   Specimen: Urine, Clean Catch  Result Value Ref Range Status   Specimen Description URINE, CLEAN CATCH  Final   Special Requests NONE  Final   Culture (A)  Final    <10,000 COLONIES/mL INSIGNIFICANT GROWTH Performed at [facility], [address] [place], [place] [address]    Report Status 11/07/2018 FINAL  Final  SARS Coronavirus 2 by RT PCR (hospital order, performed in [facility] hospital lab) Nasopharyngeal Nasopharyngeal Swab     Status: Abnormal   Collection Time: 11/06/18  2:49 PM   Specimen: Nasopharyngeal Swab  Result Value Ref Range Status   SARS Coronavirus 2 POSITIVE (A) NEGATIVE Final    Comment: RCRV J. KOPP RN, AT 6045 11/06/18 BY D. VANHOOK (NOTE) If result is NEGATIVE SARS-CoV-2 target nucleic acids are NOT DETECTED. The SARS-CoV-2 RNA is generally detectable in upper and lower  respiratory specimens during the acute phase of infection. The lowest  concentration of SARS-CoV-2 viral copies this assay can detect is 250  copies / mL. A negative result does not preclude SARS-CoV-2 infection  and should not be used as the sole basis for treatment or other  patient management decisions.  A negative result may occur with  improper specimen collection / handling, submission of specimen other  than nasopharyngeal swab, presence of viral mutation(s) within the  areas targeted by this assay, and inadequate number of viral copies  (<250 copies / mL). A negative result must be combined with clinical  observations, patient history, and epidemiological information. If result is POSITIVE SARS-CoV-2 target nucleic acids are DETECTED. The SARS-CoV-2 RNA is generally detecta ble in upper and lower  respiratory specimens during the acute phase of infection.  Positive  results are indicative of active infection with SARS-CoV-2.   Clinical  correlation with patient history and other diagnostic information is  necessary to determine patient infection status.  Positive results do  not rule out bacterial infection or co-infection with other viruses. If result is PRESUMPTIVE POSTIVE SARS-CoV-2 nucleic acids MAY BE PRESENT.   A presumptive positive result was obtained on the submitted specimen  and confirmed on repeat testing.  While 2019 novel coronavirus  (SARS-CoV-2) nucleic acids may be present in the submitted sample  additional confirmatory testing may be necessary for epidemiological  and / or clinical management purposes  to differentiate between  SARS-CoV-2 and other Sarbecovirus currently known to infect humans.  If clinically indicated additional testing with an alternate test  methodology [phone]) is advised. The SARS-CoV-2 RNA is genera lly  detectable in upper and lower respiratory specimens during the acute  phase of infection. The expected result is Negative. Fact Sheet for Patients:  BoilerBrush.com.cy Fact Sheet for Healthcare Providers: https://pope.com/ This test is not yet approved or cleared by the [place] FDA and has been authorized for detection and/or diagnosis of SARS-CoV-2 by FDA under an Emergency Use Authorization (EUA).  This EUA will remain in effect (meaning this test can be used) for the duration of the COVID-19 declaration under Section 564(b)(1) of the Act, 21 U.S.C. section 360bbb-3(b)(1), unless the authorization is terminated or revoked sooner. Performed at [facility], [address], [place], [place] [address]   Culture, blood (routine x 2)     Status: None (Preliminary result)   Collection Time: 11/06/18  3:00 PM   Specimen: BLOOD  Result Value Ref Range Status   Specimen Description BLOOD RIGHT ANTECUBITAL  Final   Special Requests   Final    BOTTLES DRAWN AEROBIC AND ANAEROBIC Blood Culture adequate volume    Culture   Final    NO GROWTH < 24 HOURS Performed at [facility], [address] [place], [place] [address]    Report Status PENDING  Incomplete  Culture, blood (routine x 2)     Status: None (Preliminary result)   Collection Time: 11/06/18  6:48 PM   Specimen: BLOOD LEFT FOREARM  Result Value Ref Range Status   Specimen Description BLOOD LEFT FOREARM  Final   Special Requests   Final    BOTTLES DRAWN AEROBIC AND ANAEROBIC Blood Culture results may not be optimal due to an excessive volume of blood received in culture bottles   Culture   Final    NO GROWTH < 12 HOURS Performed at [facility], [address], [place], [place] [address]    Report Status PENDING  Incomplete  SARS CORONAVIRUS 2 (TAT 6-24 HRS) Nasopharyngeal Nasopharyngeal Swab     Status: None   Collection Time: 11/06/18  8:45 PM   Specimen: Nasopharyngeal Swab  Result Value Ref Range Status   SARS Coronavirus 2 NEGATIVE NEGATIVE Final    Comment: (NOTE) SARS-CoV-2 target nucleic acids are NOT DETECTED. The SARS-CoV-2 RNA is generally detectable in upper and lower respiratory specimens during the acute phase of infection. Negative results do not preclude SARS-CoV-2 infection, do not rule out co-infections with other pathogens, and should not be used as the sole basis for treatment or other patient management decisions. Negative results must be combined with clinical observations, patient history, and epidemiological information. The expected result is Negative. Fact Sheet for Patients: HairSlick.no Fact Sheet for Healthcare Providers: quierodirigir.com This test is not yet approved  or cleared by the [place] FDA and  has been authorized for detection and/or diagnosis of SARS-CoV-2 by FDA under an Emergency Use Authorization (EUA). This EUA will remain  in effect (meaning this test can be used) for the duration of the COVID-19 declaration  under Section 56 4(b)(1) of the Act, 21 U.S.C. section 360bbb-3(b)(1), unless the authorization is terminated or revoked sooner. Performed at [facility], [address] [place], [place] [address]      Time coordinating discharge:  39minutes  SIGNED:   Alwyn RenElizabeth G Khyla Mccumbers, MD  [facility] 11/08/2018, 9:34 AM Pager   If 7PM-7AM, please contact night-coverage [email or web address] Password [email or web address]

## 2018-11-11 LAB — CULTURE, BLOOD (ROUTINE X 2)
Culture: NO GROWTH
Culture: NO GROWTH
Special Requests: ADEQUATE

## 2018-11-15 ENCOUNTER — Ambulatory Visit: Payer: Medicare Other | Admitting: Nurse Practitioner

## 2018-11-17 ENCOUNTER — Telehealth: Payer: Self-pay | Admitting: Internal Medicine

## 2018-11-17 NOTE — Telephone Encounter (Signed)
Hi Dr. Henrene Pastor, this patient would like to transfer his GI care over to you because his daughter Rodney Langton is your patient. He has been seeing Dr. Tamala Julian at [facility]. We received his records and they will be placed on your desk for review. Please advise on scheduling. Thank you.

## 2018-11-18 ENCOUNTER — Other Ambulatory Visit: Payer: Self-pay | Admitting: Nurse Practitioner

## 2018-11-22 ENCOUNTER — Telehealth: Payer: Self-pay | Admitting: Cardiology

## 2018-11-22 NOTE — Telephone Encounter (Signed)
Daughter of the patient called. The patient had labs drawn at his PCP Dr. Dois Davenport office yesterday. The daughter reports that Dr. Redmond Pulling faxed the results of his blood work to Dr. Macky Lower office. The daughter also reports that the labs stated that the potassium was high. The daughter wants to know what Dr. Curt Bears wants the patient to do to regulate his potassium

## 2018-11-23 ENCOUNTER — Telehealth: Payer: Self-pay | Admitting: Cardiology

## 2018-11-23 NOTE — Telephone Encounter (Signed)
New message:     Patient daughter calling concering her father. Please call back.

## 2018-11-23 NOTE — Telephone Encounter (Signed)
Jamelle Rushing L at 11/23/2018 4:55 PM  Status: Signed    New message:     Patient daughter calling concering her father. Please call back.

## 2018-11-23 NOTE — Telephone Encounter (Signed)
Received lab from PCP. 10/26 K+ 5.4. The daughter wants to know what Dr. Curt Bears wants the patient to do to regulate his potassium Forwarding to [facility] for advisement.

## 2018-11-23 NOTE — Telephone Encounter (Signed)
Left message informing dtr that we have not received result.  Informed that I contacted PCP office and they are faxing now.  Aware I will follow up once received and reviewed by [name].

## 2018-11-25 NOTE — Telephone Encounter (Signed)
Records reviewed.  Okay for routine office visit with me

## 2018-11-28 ENCOUNTER — Encounter: Payer: Self-pay | Admitting: Internal Medicine

## 2018-11-28 NOTE — Telephone Encounter (Signed)
Consult sch on 12/21.

## 2018-11-30 ENCOUNTER — Telehealth: Payer: Self-pay | Admitting: Cardiology

## 2018-11-30 NOTE — Telephone Encounter (Signed)
New Message:   Daughter called and said she will need to come in with pt for his appt on Friday to see Pecolia Ades. She said pt cn not hear well at all, even with his hearing aide.

## 2018-12-02 ENCOUNTER — Ambulatory Visit: Payer: Medicare Other | Admitting: Cardiology

## 2018-12-02 ENCOUNTER — Other Ambulatory Visit: Payer: Self-pay

## 2018-12-02 ENCOUNTER — Encounter: Payer: Self-pay | Admitting: Cardiology

## 2018-12-02 ENCOUNTER — Ambulatory Visit (INDEPENDENT_AMBULATORY_CARE_PROVIDER_SITE_OTHER): Payer: Medicare Other | Admitting: Cardiology

## 2018-12-02 VITALS — BP 124/62 | HR 63 | Ht 66.0 in | Wt 185.0 lb

## 2018-12-02 DIAGNOSIS — I6523 Occlusion and stenosis of bilateral carotid arteries: Secondary | ICD-10-CM | POA: Diagnosis not present

## 2018-12-02 DIAGNOSIS — I25118 Atherosclerotic heart disease of native coronary artery with other forms of angina pectoris: Secondary | ICD-10-CM

## 2018-12-02 DIAGNOSIS — M7989 Other specified soft tissue disorders: Secondary | ICD-10-CM | POA: Diagnosis not present

## 2018-12-02 DIAGNOSIS — Z79899 Other long term (current) drug therapy: Secondary | ICD-10-CM | POA: Diagnosis not present

## 2018-12-02 DIAGNOSIS — I509 Heart failure, unspecified: Secondary | ICD-10-CM

## 2018-12-02 DIAGNOSIS — I4819 Other persistent atrial fibrillation: Secondary | ICD-10-CM

## 2018-12-02 MED ORDER — FUROSEMIDE 80 MG PO TABS: 80.0000 mg | ORAL_TABLET | ORAL | 3 refills | Status: DC

## 2018-12-02 NOTE — Patient Instructions (Addendum)
Medication Instructions:  Start Lasix 80 mg every morning   *If you need a refill on your cardiac medications before your next appointment, please call your pharmacy*  Lab Work: TODAY : BMET   If you have labs (blood work) drawn today and your tests are completely normal, you will receive your results only by: Marland Kitchen MyChart Message (if you have MyChart) OR . A paper copy in the mail If you have any lab test that is abnormal or we need to change your treatment, we will call you to review the results.  Testing/Procedures: NONE    Follow-Up: You are scheduled to see Dr. Curt Bears on 01/16/2019 @ 3:30 PM   Other Instructions

## 2018-12-02 NOTE — Progress Notes (Signed)
Cardiology Office Note:    Date:  12/02/2018   ID:  Ronnie Torres, DOB [birth year], MRN [id number]  PCP:  Jim Like, MD  Cardiologist:  No primary care provider on file.  Electrophysiologist: Will Jorja Loa, MD  Referring MD: Jim Like, MD   Chief Complaint  Patient presents with  . Hospitalization Follow-up    History of Present Illness:    Ronnie Torres is a 85 y.o. male with a past medical history significant for atrial fibrillation on Eliquis and dofetilide, GERD, COPD, BPH and intermittent lower extremity edema.  He is followed in our EP clinic by Dr. Elberta Fortis.  He has had multiple episodes of chest pain in the past.  He had coronary angiography on 11/02/2017 that showed proximal to mid LAD lesion that was 100% stenosed.  It was thought at that time due to his minimal symptoms that a CTO intervention was not necessary.  He was hospitalized on 11/29/2017 for dofetilide load.  He left the hospital in atrial fibrillation but still on dofetilide.  He followed up in A. fib clinic in sinus rhythm.  He was last seen in the office on 09/23/2018 by Dr. Elberta Fortis and noted to have had no further episodes of atrial fibrillation.  He was asymptomatic.  He was having a few palpitations which correlated with PVCs on EKG.  He was hospitalized 11/08/2018 with bloody diarrhea.  He had had a recent C2 fracture with fall due to tripping that was treated at [facility].  Hemoglobin was 12.9 and stool was positive for blood.  CT scan of the abdomen showed diverticulitis.  The patient had been started on aspirin by his PCP for possible carotid stenosis.  During this hospitalization aspirin and Eliquis were put on hold.  Eliquis has been resumed and aspirin 3 times per week.  The patient is here for hospital follow-up with his daughter. He is wearing a restrictive neck brace 24/7.  Related to his most recent hospitalization his daughter says that the patient was having trouble moving his bowels and took  about 4 doses of MOM. He then developed loose stools and weakness. His potassium was low. He initially tested positive for COVID but retested negative.  He and his wife had had Covid in June and they felt that the positive test was just a residual effect. He was told that he did not currently have COVID per ID. He was treated for diverticulitis with antibiotics. He has follow up with GI 12/21. He is back on Eliquis and is now taking aspirin 81 mg on MWF. His daughter says that pt always has microscopic blood in the stool.   He is currently having leg edema.  His PCP increased his Lasix from 40 mg daily to 80 mg in the morning and 40 mg in the afternoon.  His daughter is very worried that he will get dehydrated or decrease his potassium which he needs to keep stable while on Tikosyn.  Apparently his potassium was elevated at recent office visit.  He has chronic dyspnea on exertion related to COPD.  He is having no worse dyspnea than usual.  He denies any orthopnea.  He is able to sleep in a flat bed.  He reports that an increase in metoprolol has helped his heart skipping.  He has been back to see the spine MD in [place] and is healing slowly. They are trying not to do surgery.   CT angiography of the neck on 10/01/2018 at [facility] showed Calcific atherosclerosis at the  bilateral carotid bifurcations which contribute to high-grade (greater than 70%) stenosis of the proximal right ICA (series 5, image 253). He will need carotid dopplers once he is out of the C Collar.    Cardiac studies   CORONARY ANGIOGRAPHY (CATH lab) 11/02/2017  Conclusion   Ost 2nd Mrg to 2nd Mrg lesion is 30% stenosed.  Prox LAD to Mid LAD lesion is 100% stenosed.   1.  Total occlusion of the LAD just after the first diagonal branch 2.  Minor nonobstructive disease involving the left main, left circumflex, and RCA  Recommendation: Consider CTO-PCI of the LAD.  There is a well-formed epicardial collateral from the obtuse  marginal branch to the LAD and the length of the total occlusion appears to be fairly short.  Will refer to Dr Eldridge Dace or [place].  Resume apixaban tomorrow.    Echocardiogram 10/01/2017 Study Conclusions - Left ventricle: The cavity size was normal. There was mild   concentric hypertrophy. Systolic function was mildly reduced. The   estimated ejection fraction was in the range of 45% to 50%.   Diffuse hypokinesis. - Aortic valve: Transvalvular velocity was within the normal range.   There was no stenosis. There was no regurgitation. - Mitral valve: Moderately calcified annulus. Calcification.   Transvalvular velocity was within the normal range. There was no   evidence for stenosis. There was trivial regurgitation. - Left atrium: The atrium was moderately dilated. - Right ventricle: The cavity size was normal. Wall thickness was   normal. Systolic function was normal. - Tricuspid valve: There was mild regurgitation. - Pulmonary arteries: Systolic pressure was within the normal   range. PA peak pressure: 18 mm Hg (S). Past Medical History:  Diagnosis Date  . Acid reflux   . Arthritis   . COPD (chronic obstructive pulmonary disease) (HCC)   . Emphysema lung (HCC)   . Hiatal hernia   . Sleep apnea     Past Surgical History:  Procedure Laterality Date  . CARDIOVERSION N/A 12/01/2017   Procedure: CARDIOVERSION;  Surgeon: Jake Bathe, MD;  Location: [facility] ENDOSCOPY;  Service: Cardiovascular;  Laterality: N/A;  . CORONARY ANGIOGRAPHY N/A 11/02/2017   Procedure: CORONARY ANGIOGRAPHY (CATH LAB);  Surgeon: Tonny Bollman, MD;  Location: [facility] INVASIVE CV LAB;  Service: Cardiovascular;  Laterality: N/A;    Current Medications: Current Meds  Medication Sig  . albuterol (PROVENTIL HFA;VENTOLIN HFA) 108 (90 Base) MCG/ACT inhaler Inhale 2 puffs into the lungs every 4 (four) hours as needed for wheezing or shortness of breath.   Marland Kitchen apixaban (ELIQUIS) 5 MG TABS tablet Take 1 tablet (5 mg total)  by mouth 2 (two) times daily.  Marland Kitchen aspirin EC 81 MG tablet Take 81 mg by mouth daily.  . budesonide-formoterol (SYMBICORT) 160-4.5 MCG/ACT inhaler Inhale 2 puffs into the lungs 2 (two) times daily.  Marland Kitchen dofetilide (TIKOSYN) 250 MCG capsule Take 1 capsule by mouth twice daily  . ferrous sulfate 324 (65 Fe) MG TBEC Take 1 tablet by mouth daily.  Marland Kitchen gabapentin (NEURONTIN) 100 MG capsule Take 100 mg by mouth 2 (two) times daily.   . isosorbide mononitrate (IMDUR) 30 MG 24 hr tablet TAKE ONE-HALF TABLET BY  MOUTH DAILY  . Menthol, Topical Analgesic, (STOPAIN) 8 % LIQD Apply 1 application topically daily as needed (joint pain).  . metoprolol tartrate (LOPRESSOR) 25 MG tablet Take 1 tablet (25 mg total) by mouth 2 (two) times daily.  Marland Kitchen omeprazole (PRILOSEC) 20 MG capsule Take 20 mg by mouth 2 (two)  times daily.   . potassium chloride SA (KLOR-CON) 20 MEQ tablet TAKE 1 TABLET BY MOUTH  DAILY  . senna-docusate (SENOKOT S) 8.6-50 MG tablet Take 1 tablet by mouth at bedtime as needed for mild constipation.  . sertraline (ZOLOFT) 25 MG tablet Take 25 mg by mouth at bedtime.   . tamsulosin (FLOMAX) 0.4 MG CAPS capsule Take 0.4 mg by mouth.  . [DISCONTINUED] furosemide (LASIX) 40 MG tablet Take 1 tablet (40 mg total) by mouth every morning. Start taking it 10/17  . [DISCONTINUED] furosemide (LASIX) 40 MG tablet Take 40-80 mg by mouth as directed. 80 in the morning and 40 evening  . [DISCONTINUED] tamsulosin (FLOMAX) 0.4 MG CAPS capsule Take 0.4 mg by mouth 2 (two) times daily.      Allergies:   Shellfish allergy, Tape, Sulfa antibiotics, and Trazodone and nefazodone   Social History   Socioeconomic History  . Marital status: Married    Spouse name: Not on file  . Number of children: Not on file  . Years of education: Not on file  . Highest education level: Not on file  Occupational History  . Not on file  Social Needs  . Financial resource strain: Not on file  . Food insecurity    Worry: Not on file     Inability: Not on file  . Transportation needs    Medical: Not on file    Non-medical: Not on file  Tobacco Use  . Smoking status: Former Smoker    Quit date: 1975    Years since quitting: 45.8  . Smokeless tobacco: Never Used  Substance and Sexual Activity  . Alcohol use: Never    Frequency: Never  . Drug use: Never  . Sexual activity: Not on file  Lifestyle  . Physical activity    Days per week: Not on file    Minutes per session: Not on file  . Stress: Not on file  Relationships  . Social Musician on phone: Not on file    Gets together: Not on file    Attends religious service: Not on file    Active member of club or organization: Not on file    Attends meetings of clubs or organizations: Not on file    Relationship status: Not on file  Other Topics Concern  . Not on file  Social History Narrative  . Not on file     Family History: The patient's family history includes Atrial fibrillation in his brother; CVA in his father; Cancer in his brother and daughter; Dementia in his sister and sister; Other in his mother; Pancreatic cancer in his brother. ROS:   Please see the history of present illness.     All other systems reviewed and are negative.   EKG:  EKG is  ordered today.  The ekg ordered today demonstrates sinus rhythm, first-degree AV block, 63 bpm, QTC 435.  Voltage criteria for left ventricular hypertrophy.  Abnormal R wave progression.  Recent Labs: 12/10/2017: Magnesium 2.0 11/08/2018: ALT 15; BUN 10; Creatinine, Ser 1.34; Hemoglobin 12.9; Platelets 106; Potassium 4.3; Sodium 141   Recent Lipid Panel No results found for: CHOL, TRIG, HDL, CHOLHDL, VLDL, LDLCALC, LDLDIRECT  Physical Exam:    VS:  BP 124/62   Pulse 63   Ht  (1.676 m)   Wt 185 lb (83.9 kg)   SpO2 95%   BMI 29.86 kg/m     Wt Readings from Last 6 Encounters:  12/02/18 185  lb (83.9 kg)  11/08/18 180 lb 12.4 oz (82 kg)  09/23/18 182 lb (82.6 kg)  01/07/18 192 lb  12.8 oz (87.5 kg)  12/10/17 188 lb 3.2 oz (85.4 kg)  12/02/17 184 lb 9.6 oz (83.7 kg)     Physical Exam  Constitutional: He is oriented to person, place, and time. No distress.  Frail elderly mail  HENT:  Head: Normocephalic and atraumatic.  Neck: Normal range of motion. Neck supple.  Cspine collar in place  Cardiovascular: Normal rate, regular rhythm, normal heart sounds and intact distal pulses. Exam reveals no gallop and no friction rub.  No murmur heard. Pulmonary/Chest: Effort normal and breath sounds normal. No respiratory distress. He has no wheezes. He has no rales.  Abdominal: Soft. Bowel sounds are normal.  Musculoskeletal:        General: Edema present.     Comments: 1-2+ bilateral lower leg edema  Neurological: He is alert and oriented to person, place, and time.  Skin: Skin is warm and dry.  Psychiatric: He has a normal mood and affect. His behavior is normal. Judgment and thought content normal.  Vitals reviewed.    ASSESSMENT:    1. Congestive heart failure, unspecified HF chronicity, unspecified heart failure type ([place])   2. Medication management    PLAN:    In order of problems listed above:  Leg edema -Patient has been noted to have some leg edema in the past.  EF 45-50% on echo in 2019.  Recently he was seen by his PCP and noted to have increased lower leg edema.  His Lasix was increased from 40 mg daily to 80 mg in the morning and 40 in the afternoon.  His daughter reports that his edema is only slightly better.  She is very concerned about overdiuresis since he is rather frail.  She is also concerned about his potassium level since he is on Tikosyn. -I will check a basic metabolic panel today.  We will reduce his Lasix to 80 mg in the morning.  She feels better about this.  The patient denies orthopnea or any increase in his chronic dyspnea. -His daughter reports that he is a heavy salt user.  He shakes salt every food before he tastes it.  We discussed  taking the saltshaker away and trying to really reduce the salt in his diet.  I advised him that his taste will adjust over time.  Also advised patient to elevate his feet as much as possible.  Persistent atrial fibrillation -Now controlled on Tikosyn, maintaining sinus rhythm. -QTC is 435 on EKG.  Recent magnesium per PCP was 2.0.  Recent potassium was elevated at 5.4. -We will recheck basic metabolic panel in light of increased diuretic. -Patient reports improvement in heart skipping with increase in metoprolol.  Carotid artery stenosis -CT angiography of the neck on 10/01/2018 at [facility] showed Calcific atherosclerosis at the bilateral carotid bifurcations which contribute to high-grade (greater than 70%) stenosis of the proximal right ICA (series 5, image 253). He will need carotid dopplers once he is out of the C Collar.  -The patient was placed on aspirin 81 mg daily but this has been reduced to 3 times per week.  CAD -Chronically occluded LAD on cath.  Patient denies any current chest pain.  Medication Adjustments/Labs and Tests Ordered: Current medicines are reviewed at length with the patient today.  Concerns regarding medicines are outlined above. Labs and tests ordered and medication changes are outlined in the patient instructions below:  Patient Instructions  Medication Instructions:  Start Lasix 80 mg every morning   *If you need a refill on your cardiac medications before your next appointment, please call your pharmacy*  Lab Work: TODAY : BMET   If you have labs (blood work) drawn today and your tests are completely normal, you will receive your results only by: Marland Kitchen. MyChart Message (if you have MyChart) OR . A paper copy in the mail If you have any lab test that is abnormal or we need to change your treatment, we will call you to review the results.  Testing/Procedures: NONE    Follow-Up: You are scheduled to see Dr. Elberta Fortisamnitz on 01/16/2019 @ 3:30 PM   Other  Instructions      Signed, Berton BonJanine Mischa Brittingham, NP  12/02/2018 6:20 PM    [facility]

## 2018-12-03 LAB — BASIC METABOLIC PANEL
BUN/Creatinine Ratio: 21 (ref 10–24)
BUN: 23 mg/dL (ref 8–27)
CO2: 25 mmol/L (ref 20–29)
Calcium: 9.6 mg/dL (ref 8.6–10.2)
Chloride: 103 mmol/L (ref 96–106)
Creatinine, Ser: 1.07 mg/dL (ref 0.76–1.27)
GFR calc Af Amer: 72 mL/min/{1.73_m2} (ref >59)
GFR calc non Af Amer: 62 mL/min/{1.73_m2} (ref >59)
Glucose: 77 mg/dL (ref 65–99)
Potassium: 4 mmol/L (ref 3.5–5.2)
Sodium: 142 mmol/L (ref 134–144)

## 2019-01-11 ENCOUNTER — Telehealth: Payer: Self-pay | Admitting: Cardiology

## 2019-01-11 NOTE — Telephone Encounter (Signed)
New message   Daughter calling to request joining her father during 12/21 appointment; hard of hearing

## 2019-01-12 NOTE — Telephone Encounter (Signed)
Dtr aware ok to come up with pt to appt. She appreciates the understanding.

## 2019-01-16 ENCOUNTER — Ambulatory Visit (INDEPENDENT_AMBULATORY_CARE_PROVIDER_SITE_OTHER): Payer: Medicare Other | Admitting: Internal Medicine

## 2019-01-16 ENCOUNTER — Other Ambulatory Visit: Payer: Self-pay

## 2019-01-16 ENCOUNTER — Encounter: Payer: Self-pay | Admitting: Internal Medicine

## 2019-01-16 ENCOUNTER — Ambulatory Visit (INDEPENDENT_AMBULATORY_CARE_PROVIDER_SITE_OTHER): Payer: Medicare Other | Admitting: Cardiology

## 2019-01-16 ENCOUNTER — Encounter: Payer: Self-pay | Admitting: Cardiology

## 2019-01-16 VITALS — BP 104/60 | HR 84 | Temp 97.5°F | Ht 63.5 in | Wt 193.1 lb

## 2019-01-16 VITALS — BP 114/62 | HR 84 | Ht 63.5 in | Wt 193.2 lb

## 2019-01-16 DIAGNOSIS — Z7901 Long term (current) use of anticoagulants: Secondary | ICD-10-CM

## 2019-01-16 DIAGNOSIS — I4819 Other persistent atrial fibrillation: Secondary | ICD-10-CM

## 2019-01-16 DIAGNOSIS — I6523 Occlusion and stenosis of bilateral carotid arteries: Secondary | ICD-10-CM

## 2019-01-16 DIAGNOSIS — I2510 Atherosclerotic heart disease of native coronary artery without angina pectoris: Secondary | ICD-10-CM

## 2019-01-16 DIAGNOSIS — K624 Stenosis of anus and rectum: Secondary | ICD-10-CM

## 2019-01-16 DIAGNOSIS — K625 Hemorrhage of anus and rectum: Secondary | ICD-10-CM | POA: Diagnosis not present

## 2019-01-16 DIAGNOSIS — K222 Esophageal obstruction: Secondary | ICD-10-CM

## 2019-01-16 DIAGNOSIS — K5732 Diverticulitis of large intestine without perforation or abscess without bleeding: Secondary | ICD-10-CM

## 2019-01-16 DIAGNOSIS — R935 Abnormal findings on diagnostic imaging of other abdominal regions, including retroperitoneum: Secondary | ICD-10-CM

## 2019-01-16 DIAGNOSIS — K5900 Constipation, unspecified: Secondary | ICD-10-CM | POA: Diagnosis not present

## 2019-01-16 NOTE — Patient Instructions (Signed)
Medication Instructions:  Your physician has recommended you make the following change in your medication:  1. INCREASE Lasix to 80 mg TWICE a day for 4 days, then return to normal dosing of 80 mg ONCE daily 2. INCREASE Potassium to 20 mEq TWICE a day while taking increased Lasix, then return to normal dosing of 20 mEq ONCE daily  * If you need a refill on your cardiac medications before your next appointment, please call your pharmacy.   Labwork: None ordered If you have labs (blood work) drawn today and your tests are completely normal, you will receive your results only by:  [place] (if you have MyChart) OR  A paper copy in the mail If you have any lab test that is abnormal or we need to change your treatment, we will call you to review the results.  Testing/Procedures: Your physician has requested that you have an echocardiogram. Echocardiography is a painless test that uses sound waves to create images of your heart. It provides your doctor with information about the size and shape of your heart and how well your heart's chambers and valves are working. This procedure takes approximately one hour. There are no restrictions for this procedure.  Follow-Up: At [facility], you and your health needs are our priority.  As part of our continuing mission to provide you with exceptional heart care, we have created designated Provider Care Teams.  These Care Teams include your primary Cardiologist (physician) and Advanced Practice Providers (APPs -  Physician Assistants and Nurse Practitioners) who all work together to provide you with the care you need, when you need it.  You will need a follow up appointment in 6 months.  Please call our office 2 months in advance to schedule this appointment.  You may see Dr Curt Bears or one of the following Advanced Practice Providers on your designated Care Team:    Chanetta Marshall, NP  Tommye Standard, PA-C  Oda Kilts, [place]  Thank you for  choosing [facility]!!   Trinidad Curet, RN [phone]  Any Other Special Instructions Will Be Listed Below (If Applicable).

## 2019-01-16 NOTE — Progress Notes (Signed)
Electrophysiology Office Note   Date:  01/16/2019   ID:  Ronnie Torres, DOB 05/30/1929, MRN [id number]  PCP:  Jim Like, MD  Cardiologist:   Primary Electrophysiologist:  Sharrell Krawiec Jorja Loa, MD    No chief complaint on file.    History of Present Illness: Ronnie Torres is a 85 y.o. male who is being seen today for the evaluation of atrial fibrillation at the request of Atha Starks. Presenting today for electrophysiology evaluation.  He has a history of paroxysmal atrial fibrillation on Eliquis and amiodarone, GERD, COPD, BPH, and intermittent lower extremity edema.  His main complaint today is of weakness and fatigue.  He says that he went into atrial fibrillation a few months ago, which is when the symptoms started.  He is also been more short of breath than usual.  He has tried amiodarone, but did not stay in normal rhythm.  He is also been having some chest pain.  He has had multiple episodes in the past.  He had a Myoview which was unremarkable.  He continues to have substernal chest pain that radiates to his neck.  He had coronary angiography on 11/02/2017.  This showed a proximal to mid LAD lesion that was 100% stenosed.  It was thought at that time due to his minimal symptoms that a CTO intervention was not necessary.  He was hospitalized on 11/29/2017 for dofetilide load.  He left the hospital in atrial fibrillation but still on dofetilide.  He followed up in A. fib clinic in sinus rhythm.  Today, denies symptoms of palpitations, chest pain, shortness of breath, orthopnea, PND,claudication, dizziness, presyncope, syncope, bleeding, or neurologic sequela. The patient is tolerating medications without difficulties.  He continues to have issues with lower extremity edema.  This has been occurring since his diagnosis of Covid last summer.  He also had a fall and a neck fracture.  This was 3 months ago.  He continues to wear a neck brace when he is out of the house.  His lower extremity edema  causes him quite a bit of stiffness and pain.  Past Medical History:  Diagnosis Date  . Acid reflux   . Anemia   . Arthritis   . Atrial fibrillation (HCC)   . BPH (benign prostatic hyperplasia)   . CAD (coronary artery disease)   . Colon polyps   . COPD (chronic obstructive pulmonary disease) (HCC)   . COVID-19   . Diverticulitis   . Diverticulosis   . Emphysema lung (HCC)   . GERD (gastroesophageal reflux disease)   . Gout   . Hemorrhoids   . Hiatal hernia   . Sleep apnea    CPAP  . Status post dilation of esophageal narrowing    Past Surgical History:  Procedure Laterality Date  . CARDIOVERSION N/A 12/01/2017   Procedure: CARDIOVERSION;  Surgeon: Jake Bathe, MD;  [facility] END[facility]COPY;  Service: Cardiovascular;  Laterality: N/A;  . CORONARY ANGIOGRAPHY N/A 11/02/2017   Procedure: CORONARY ANGIOGRAPHY (CATH LAB);  Surgeon: Tonny Bollman, MD;  Location: [facility] INVASIVE CV LAB;  Service: Cardiovascular;  Laterality: N/A;  . HEMORROIDECTOMY    . KIDNEY STONE SURGERY     Basket     Current Outpatient Medications  Medication Sig Dispense Refill  . albuterol (PROVENTIL HFA;VENTOLIN HFA) 108 (90 Base) MCG/ACT inhaler Inhale 2 puffs into the lungs every 4 (four) hours as needed for wheezing or shortness of breath.     Marland Kitchen apixaban (ELIQUIS) 5 MG TABS tablet Take 1  tablet (5 mg total) by mouth 2 (two) times daily. 60 tablet 0  . aspirin 81 MG EC tablet Take 1 tablet by mouth 3 (three) times a week. Monday, Wednesday, and Friday    . budesonide-formoterol (SYMBICORT) 160-4.5 MCG/ACT inhaler Inhale 2 puffs into the lungs 2 (two) times daily.    Marland Kitchen. dofetilide (TIKOSYN) 250 MCG capsule Take 1 capsule by mouth twice daily 180 capsule 3  . ferrous sulfate 324 (65 Fe) MG TBEC Take 1 tablet by mouth daily.    . furosemide (LASIX) 80 MG tablet Take 1 tablet (80 mg total) by mouth every morning. 90 tablet 3  . gabapentin (NEURONTIN) 100 MG capsule Take 100 mg by mouth 2 (two) times daily.      . isosorbide mononitrate (IMDUR) 30 MG 24 hr tablet TAKE ONE-HALF TABLET BY  MOUTH DAILY 45 tablet 3  . Menthol, Topical Analgesic, (STOPAIN) 8 % LIQD Apply 1 application topically daily as needed (joint pain).    . metoprolol tartrate (LOPRESSOR) 25 MG tablet Take 1 tablet (25 mg total) by mouth 2 (two) times daily. 30 tablet 6  . omeprazole (PRILOSEC) 20 MG capsule Take 20 mg by mouth 2 (two) times daily.     . potassium chloride SA (KLOR-CON) 20 MEQ tablet TAKE 1 TABLET BY MOUTH  DAILY 90 tablet 2  . senna-docusate (SENOKOT S) 8.6-50 MG tablet Take 1 tablet by mouth at bedtime as needed for mild constipation.    . sertraline (ZOLOFT) 25 MG tablet Take 25 mg by mouth at bedtime.     . tamsulosin (FLOMAX) 0.4 MG CAPS capsule Take 0.4 mg by mouth daily.      No current facility-administered medications for this visit.    Allergies:   Shellfish allergy, Tape, Sulfa antibiotics, and Trazodone and nefazodone   Social History:  The patient  reports that he quit smoking about 46 years ago. He has quit using smokeless tobacco.  His smokeless tobacco use included chew. He reports that he does not drink alcohol or use drugs.   Family History:  The patient's family history includes Atrial fibrillation in his brother; Breast cancer in his daughter; CVA in his father; Dementia in his sister and sister; Kidney cancer in his daughter; Other in his mother; Pancreatic cancer in his brother; Stomach cancer in his brother.   ROS:  Please see the history of present illness.   Otherwise, review of systems is positive for none.   All other systems are reviewed and negative.   PHYSICAL EXAM: VS:  BP 114/62   Pulse 84   Ht 5' 3.5" (1.613 m)   Wt 193 lb 3.2 oz (87.6 kg)   SpO2 95%   BMI 33.69 kg/m  , BMI Body mass index is 33.69 kg/m. GEN: Well nourished, well developed, in no acute distress  HEENT: normal  Neck: no JVD, carotid bruits, or masses Cardiac: ; no murmurs, rubs, or gallops,no edema    Respiratory:  clear to auscultation bilaterally, normal work of breathing GI: soft, nontender, nondistended, + BS MS: no deformity or atrophy  Skin: warm and dry Neuro:  Strength and sensation are intact Psych: euthymic mood, full affect  EKG:  EKG is ordered today. Personal review of the ekg ordered shows sinus rhythm, atrial bigeminy, rate 84, first-degree AV block  Recent Labs: 11/08/2018: ALT 15; Hemoglobin 12.9; Platelets 106 12/02/2018: BUN 23; Creatinine, Ser 1.07; Potassium 4.0; Sodium 142    Lipid Panel  No results found for: CHOL, TRIG,  HDL, CHOLHDL, VLDL, LDLCALC, LDLDIRECT   Wt Readings from Last 3 Encounters:  01/16/19 193 lb 3.2 oz (87.6 kg)  01/16/19 193 lb 2 oz (87.6 kg)  12/02/18 185 lb (83.9 kg)      Other studies Reviewed: Additional studies/ records that were reviewed today include: Myoview 06/29/2017 Review of the above records today demonstrates:  No inducible ischemia No wall motion abnormalities Normal left ventricular function  TTE 10/01/17 - Left ventricle: The cavity size was normal. There was mild   concentric hypertrophy. Systolic function was mildly reduced. The   estimated ejection fraction was in the range of 45% to 50%.   Diffuse hypokinesis. - Aortic valve: Transvalvular velocity was within the normal range.   There was no stenosis. There was no regurgitation. - Mitral valve: Moderately calcified annulus. Calcification.   Transvalvular velocity was within the normal range. There was no   evidence for stenosis. There was trivial regurgitation. - Left atrium: The atrium was moderately dilated. - Right ventricle: The cavity size was normal. Wall thickness was   normal. Systolic function was normal. - Tricuspid valve: There was mild regurgitation. - Pulmonary arteries: Systolic pressure was within the normal   range. PA peak pressure: 18 mm Hg (S).  LHC 11/02/17  Ost 2nd Mrg to 2nd Mrg lesion is 30% stenosed.  Prox LAD to Mid LAD lesion  is 100% stenosed.   1.  Total occlusion of the LAD just after the first diagonal branch 2.  Minor nonobstructive disease involving the left main, left circumflex, and RCA   ASSESSMENT AND PLAN:  1.  Persistent atrial fibrillation: Currently on Eliquis and dofetilide.  Having episodes of shortness of breath that are potentially due to PVCs.  Also on metoprolol.  CHA2DS2-VASc of 3.  In sinus rhythm with PACs.  We Ryn Peine continue with current management.   2.  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  Has chronic stable angina.  Currently on Imdur.  3.  Lower extremity edema: Edema has been worse ever since he was diagnosed with Covid last summer.  He is currently on Lasix.  Brennan Karam increase Lasix to 80 mg twice a day for the next 4 days and also increase his potassium.  We Windell Musson also repeat an echo to make sure that his ejection fraction has not become reduced since his Covid diagnosis.  Current medicines are reviewed at length with the patient today.   The patient does not have concerns regarding his medicines.  The following changes were made today: Increase Lasix, potassium  Labs/ tests ordered today include:  Orders Placed This Encounter  Procedures  . EKG 12-Lead  . ECHOCARDIOGRAM COMPLETE     Disposition:   FU with Aristea Posada 6 months  Signed, Athea Haley Meredith Leeds, MD  01/16/2019 4:36 PM     [address] [address] [phone] (office) [phone] (fax)

## 2019-01-16 NOTE — Patient Instructions (Signed)
Please follow up as needed 

## 2019-01-16 NOTE — Progress Notes (Signed)
HISTORY OF PRESENT ILLNESS:  Ronnie Torres is a 85 y.o. male with multiple significant medical problems including COPD, coronary artery disease, atrial fibrillation on Eliquis, GERD complicated by peptic stricture requiring esophageal dilation, and recurrent rectal bleeding attributed to hemorrhoids for which she is undergone prior hemorrhoidectomy.  Patient is new to this practice.  Previously under the care of Dr. Tilman Neat of [facility] in [facility].  I have reviewed outside records.  Patient has undergone upper endoscopy with esophageal dilation for esophageal stricture and large hiatal hernia on multiple occasions.  Most recently August 2020.  He is also undergone previous colonoscopy on several occasions for colon polyps.  Most recent examination January 2019 was incomplete due to sigmoid colon stenosis from diverticular disease.  1 cm adenomatous rectal polyp removed.  Patient has had a difficult year with progressive decline in health.  He suffered from Covid 19 infection in June.  In September he fell and fractured his neck.  He has a C2 fracture for which she has a cervical collar in place.  Finally, he was hospitalized at [facility] November 06, 2018 through November 08, 2018 for diagnosis of diverticulitis.  At that time the patient was admitted with bloody diarrhea after taking laxatives and stool softeners.  His hemoglobin was quite stable at discharge with a level of 12.9.  He was dehydrated.  He was treated with Zosyn after CT scan was said to show changes consistent with diverticulitis.  Other entities cannot be excluded.  He is accompanied today by his daughter Ronnie Torres (my patient) who wanted to take him to our office after GI outpatient follow-up was recommended by the hospitalists in October.  Patient reports that his swallowing is doing well.  He is maintained on omeprazole daily.  No heartburn.  He does have occasional "choking spells" when liquids seem to go down  "the wrong pipe".  He denies abdominal pain.  He does have chronic constipation for which he takes stool softeners and occasional MiraLAX.  Does experience some rectal discomfort when he passes a hard stool and will notice blood.  He was said to have a rectal stricture at the time of his colonoscopy which was digitally dilated.  He does take iron supplements.  REVIEW OF SYSTEMS:  All non-GI ROS negative unless otherwise stated in the HPI except for sinus and allergy, anxiety, arthritis, depression, fatigue, shortness of breath, ankle swelling, urinary frequency  Past Medical History:  Diagnosis Date  . Acid reflux   . Anemia   . Arthritis   . Atrial fibrillation ([place])   . BPH (benign prostatic hyperplasia)   . CAD (coronary artery disease)   . Colon polyps   . COPD (chronic obstructive pulmonary disease) ([place])   . COVID-19   . Diverticulitis   . Diverticulosis   . Emphysema lung ([place])   . GERD (gastroesophageal reflux disease)   . Gout   . Hemorrhoids   . Hiatal hernia   . Sleep apnea    CPAP  . Status post dilation of esophageal narrowing     Past Surgical History:  Procedure Laterality Date  . CARDIOVERSION N/A 12/01/2017   Procedure: CARDIOVERSION;  Surgeon: Jerline Pain, MD;  Location: [facility] ENDOSCOPY;  Service: Cardiovascular;  Laterality: N/A;  . CORONARY ANGIOGRAPHY N/A 11/02/2017   Procedure: CORONARY ANGIOGRAPHY (CATH LAB);  Surgeon: Sherren Mocha, MD;  Location: [place] CV LAB;  Service: Cardiovascular;  Laterality: N/A;  . HEMORROIDECTOMY    . KIDNEY STONE  SURGERY     Basket    Social History [name]  reports that he quit smoking about 46 years ago. He has quit using smokeless tobacco.  His smokeless tobacco use included chew. He reports that he does not drink alcohol or use drugs.  family history includes Atrial fibrillation in his brother; Breast cancer in his daughter; CVA in his father; Dementia in his sister and sister; Kidney cancer in his daughter;  Other in his mother; Pancreatic cancer in his brother; Stomach cancer in his brother.  Allergies  Allergen Reactions  . Shellfish Allergy Hives  . Tape Other (See Comments)    "takes skin off"  . Sulfa Antibiotics Rash  . Trazodone And Nefazodone Rash       PHYSICAL EXAMINATION: Vital signs: BP 104/60 (BP Location: Left Arm, Patient Position: Sitting, Cuff Size: Normal)   Pulse 84 Comment: irregular  Temp (!) 97.5 F (36.4 C)   Ht 5' 3.5" (1.613 m) Comment: height measured without shoes  Wt 193 lb 2 oz (87.6 kg)   BMI 33.67 kg/m   Constitutional: Chronically ill-appearing, elderly, kyphotic, no acute distress.  Cannot make it up to the examining table Psychiatric: alert and oriented x3, cooperative.  Hard of hearing Eyes: extraocular movements intact, anicteric, conjunctiva pink Mouth: Mask Neck: supple no lymphadenopathy Cardiovascular: heart irregular rate and rhythm, no murmur Lungs: clear to auscultation bilaterally Abdomen: soft, nontender, nondistended, no obvious ascites, no peritoneal signs, normal bowel sounds, no organomegaly Rectal: Omitted Extremities: no clubbing, or cyanosis.  Trace lower extremity edema bilaterally Skin: no lesions on visible extremities Neuro: No focal deficits.  Cranial nerves intact  ASSESSMENT:  1.  Recent hospitalization with diverticulitis.  Treated with antibiotics.  No residual abdominal pain 2.  Intermittent rectal bleeding presumably due to hemorrhoids and/or fissure from anal stenosis.  Normal hemoglobin 3.  Complete colonoscopy elsewhere 2009 and limited colonoscopy with polypectomy January 2019 4.  Severe diverticulosis with sigmoid stenosis 5.  GERD complicated by peptic stricture.  Currently asymptomatic.  Has undergone multiple previous dilations 6.  Multiple significant medical problems 7.  Cervical neck fracture.  In collar 8.  Chronic constipation  PLAN:  1.  Continue PPI daily 2.  Reflux precautions 3.  Chew food  well 4.  Continue stool softeners 5.  Daily MiraLAX to keep stools soft and achieve 1-2 bowel movements per day 6.  Ongoing general medical care with your PCP and other specialists 7.  GI follow-up as needed 45 minutes spent face-to-face with the patient and his daughter.  Greater than 50% of the time used for counseling regarding his myriad of GI issues and their management

## 2019-01-17 NOTE — Addendum Note (Signed)
Addended by: Stanton Kidney on: 01/17/2019 08:35 AM   Modules accepted: Orders

## 2019-01-23 ENCOUNTER — Other Ambulatory Visit: Payer: Self-pay

## 2019-01-23 ENCOUNTER — Ambulatory Visit (HOSPITAL_COMMUNITY): Payer: Medicare Other | Attending: Cardiology

## 2019-01-23 DIAGNOSIS — I4819 Other persistent atrial fibrillation: Secondary | ICD-10-CM | POA: Diagnosis not present

## 2019-01-23 DIAGNOSIS — I2510 Atherosclerotic heart disease of native coronary artery without angina pectoris: Secondary | ICD-10-CM

## 2019-01-26 ENCOUNTER — Other Ambulatory Visit: Payer: Self-pay | Admitting: Cardiology

## 2019-02-17 MED ORDER — APIXABAN 5 MG PO TABS: 5.0000 mg | ORAL_TABLET | Freq: Two times a day (BID) | ORAL | 2 refills | Status: DC

## 2019-02-17 MED ORDER — METOPROLOL TARTRATE 25 MG PO TABS: 25.0000 mg | ORAL_TABLET | Freq: Two times a day (BID) | ORAL | 2 refills | Status: DC

## 2019-02-17 MED ORDER — FUROSEMIDE 80 MG PO TABS: 80.0000 mg | ORAL_TABLET | ORAL | 2 refills | Status: DC

## 2019-02-17 MED ORDER — POTASSIUM CHLORIDE CRYS ER 20 MEQ PO TBCR: 20.0000 meq | EXTENDED_RELEASE_TABLET | Freq: Every day | ORAL | 2 refills | Status: DC

## 2019-02-17 MED ORDER — DOFETILIDE 250 MCG PO CAPS: 250.0000 ug | ORAL_CAPSULE | Freq: Two times a day (BID) | ORAL | 2 refills | Status: DC

## 2019-03-07 MED ORDER — BISACODYL 10 MG RE SUPP
10.00 | RECTAL | Status: DC
Start: ? — End: 2019-03-07

## 2019-03-07 MED ORDER — ALUM & MAG HYDROXIDE-SIMETH 200-200-20 MG/5ML PO SUSP
30.00 | ORAL | Status: DC
Start: ? — End: 2019-03-07

## 2019-03-07 MED ORDER — BUDESONIDE 0.5 MG/2ML IN SUSP
0.50 | RESPIRATORY_TRACT | Status: DC
Start: 2019-03-05 — End: 2019-03-07

## 2019-03-07 MED ORDER — APIXABAN 5 MG PO TABS
5.00 | ORAL_TABLET | ORAL | Status: DC
Start: 2019-03-05 — End: 2019-03-07

## 2019-03-07 MED ORDER — ALBUTEROL SULFATE (2.5 MG/3ML) 0.083% IN NEBU
2.50 | INHALATION_SOLUTION | RESPIRATORY_TRACT | Status: DC
Start: ? — End: 2019-03-07

## 2019-03-07 MED ORDER — ASPIRIN 81 MG PO TBEC
81.00 | DELAYED_RELEASE_TABLET | ORAL | Status: DC
Start: 2019-03-06 — End: 2019-03-07

## 2019-03-07 MED ORDER — FERROUS SULFATE 325 (65 FE) MG PO TABS
325.00 | ORAL_TABLET | ORAL | Status: DC
Start: 2019-03-06 — End: 2019-03-07

## 2019-03-07 MED ORDER — DOFETILIDE 250 MCG PO CAPS
250.00 | ORAL_CAPSULE | ORAL | Status: DC
Start: 2019-03-05 — End: 2019-03-07

## 2019-03-07 MED ORDER — ONDANSETRON HCL 4 MG/2ML IJ SOLN
4.00 | INTRAMUSCULAR | Status: DC
Start: ? — End: 2019-03-07

## 2019-03-07 MED ORDER — PANTOPRAZOLE SODIUM 40 MG PO TBEC
40.00 | DELAYED_RELEASE_TABLET | ORAL | Status: DC
Start: 2019-03-06 — End: 2019-03-07

## 2019-03-07 MED ORDER — ARFORMOTEROL TARTRATE 15 MCG/2ML IN NEBU
15.00 | INHALATION_SOLUTION | RESPIRATORY_TRACT | Status: DC
Start: 2019-03-05 — End: 2019-03-07

## 2019-03-07 MED ORDER — HYDRALAZINE HCL 10 MG PO TABS
10.00 | ORAL_TABLET | ORAL | Status: DC
Start: ? — End: 2019-03-07

## 2019-03-07 MED ORDER — GABAPENTIN 100 MG PO CAPS
100.00 | ORAL_CAPSULE | ORAL | Status: DC
Start: 2019-03-05 — End: 2019-03-07

## 2019-03-07 MED ORDER — TAMSULOSIN HCL 0.4 MG PO CAPS
0.80 | ORAL_CAPSULE | ORAL | Status: DC
Start: 2019-03-06 — End: 2019-03-07

## 2019-03-07 MED ORDER — MELATONIN 3 MG PO TABS
3.00 | ORAL_TABLET | ORAL | Status: DC
Start: ? — End: 2019-03-07

## 2019-03-07 MED ORDER — DSS 100 MG PO CAPS
100.00 | ORAL_CAPSULE | ORAL | Status: DC
Start: ? — End: 2019-03-07

## 2019-03-07 MED ORDER — POLYETHYLENE GLYCOL 3350 17 GM/SCOOP PO POWD
17.00 | ORAL | Status: DC
Start: 2019-03-06 — End: 2019-03-07

## 2019-03-07 MED ORDER — SERTRALINE HCL 25 MG PO TABS
25.00 | ORAL_TABLET | ORAL | Status: DC
Start: 2019-03-05 — End: 2019-03-07

## 2019-03-07 MED ORDER — ACETAMINOPHEN 325 MG PO TABS
650.00 | ORAL_TABLET | ORAL | Status: DC
Start: ? — End: 2019-03-07

## 2019-03-07 MED ORDER — METOPROLOL TARTRATE 25 MG PO TABS
25.00 | ORAL_TABLET | ORAL | Status: DC
Start: 2019-03-05 — End: 2019-03-07

## 2019-03-07 MED ORDER — GENERIC EXTERNAL MEDICATION
15.00 | Status: DC
Start: 2019-03-06 — End: 2019-03-07

## 2019-03-15 MED ORDER — ISOSORBIDE MONONITRATE ER 30 MG PO TB24: 15.0000 mg | ORAL_TABLET | Freq: Every day | ORAL | 3 refills | Status: DC

## 2019-03-15 NOTE — Telephone Encounter (Signed)
Followed up with dtr. Scheduled dad for follow up with Dr. Elberta Fortis 3/15. Advised he will be more than glad to see pt's wife also.  Dtr will bring records to dad's appt and understands we can schedule a consult appt when they come that day. Patient verbalized understanding and agreeable to plan.

## 2019-03-15 NOTE — Telephone Encounter (Signed)
dtr made aware I would send in for refills. She thanks me for helping and the FYI call.

## 2019-04-10 ENCOUNTER — Ambulatory Visit (INDEPENDENT_AMBULATORY_CARE_PROVIDER_SITE_OTHER): Payer: Medicare Other | Admitting: Cardiology

## 2019-04-10 ENCOUNTER — Encounter: Payer: Self-pay | Admitting: Cardiology

## 2019-04-10 ENCOUNTER — Other Ambulatory Visit: Payer: Self-pay

## 2019-04-10 VITALS — BP 108/66 | HR 59 | Ht 63.5 in | Wt 194.0 lb

## 2019-04-10 DIAGNOSIS — I4819 Other persistent atrial fibrillation: Secondary | ICD-10-CM

## 2019-04-10 MED ORDER — METOLAZONE 2.5 MG PO TABS: 2.5000 mg | ORAL_TABLET | ORAL | 1 refills | Status: DC

## 2019-04-10 NOTE — Patient Instructions (Signed)
Medication Instructions:  Your physician has recommended you make the following change in your medication:  1. START Metolazone 2.5 mg TWICE a WEEK  *If you need a refill on your cardiac medications before your next appointment, please call your pharmacy*   Lab Work: None ordered If you have labs (blood work) drawn today and your tests are completely normal, you will receive your results only by: Marland Kitchen MyChart Message (if you have MyChart) OR . A paper copy in the mail If you have any lab test that is abnormal or we need to change your treatment, we will call you to review the results.   Testing/Procedures: None ordered   Follow-Up: At [facility], you and your health needs are our priority.  As part of our continuing mission to provide you with exceptional heart care, we have created designated Provider Care Teams.  These Care Teams include your primary Cardiologist (physician) and Advanced Practice Providers (APPs -  Physician Assistants and Nurse Practitioners) who all work together to provide you with the care you need, when you need it.  We recommend signing up for the patient portal called "MyChart".  Sign up information is provided on this After Visit Summary.  MyChart is used to connect with patients for Virtual Visits (Telemedicine).  Patients are able to view lab/test results, encounter notes, upcoming appointments, etc.  Non-urgent messages can be sent to your provider as well.   To learn more about what you can do with MyChart, go to [email or web address].    Your next appointment:   3 month(s)  The format for your next appointment:   In Person  Provider:   You may see Will Jorja Loa, MD or one of the following Advanced Practice Providers on your designated Care Team:    Gypsy Balsam, NP  Francis Dowse, PA-C  Casimiro Needle "[place]" [place], [place]    Thank you for choosing [facility]!!   Dory Horn, RN [phone]    Other Instructions Follow up with  PCP in 2 weeks for BMET lab work.  Follow up with another BMET several weeks after that.

## 2019-04-10 NOTE — Progress Notes (Signed)
Electrophysiology Office Note   Date:  04/10/2019   ID:  Ronnie Torres, DOB March 19, 1929, MRN [id number]  PCP:  Jim Like, MD  Cardiologist:   Primary Electrophysiologist:  Adon Gehlhausen Jorja Loa, MD    No chief complaint on file.    History of Present Illness: Ronnie Torres is a 86 y.o. male who is being seen today for the evaluation of atrial fibrillation at the request of Atha Starks. Presenting today for electrophysiology evaluation.  He has a history of paroxysmal atrial fibrillation on Eliquis and amiodarone, GERD, COPD, BPH, and intermittent lower extremity edema.  His main complaint today is of weakness and fatigue.  He says that he went into atrial fibrillation a few months ago, which is when the symptoms started.  He is also been more short of breath than usual.  He has tried amiodarone, but did not stay in normal rhythm.  He is also been having some chest pain.  He has had multiple episodes in the past.  He had a Myoview which was unremarkable.  He continues to have substernal chest pain that radiates to his neck.  He had coronary angiography on 11/02/2017.  This showed a proximal to mid LAD lesion that was 100% stenosed.  It was thought at that time due to his minimal symptoms that a CTO intervention was not necessary.  He was hospitalized on 11/29/2017 for dofetilide load.  He left the hospital in atrial fibrillation but still on dofetilide.  He followed up in A. fib clinic in sinus rhythm.  Today, denies symptoms of palpitations, chest pain, shortness of breath, orthopnea, PND, lower extremity edema, claudication, dizziness, presyncope, syncope, bleeding, or neurologic sequela. The patient is tolerating medications without difficulties.  Overall he is doing well.  Since he was discharged from the hospital after his second Covid shot, he has not had much in the way of major issues.  He continues to move about slowly, and does not get much activity.  He has not noted any atrial  fibrillation.  Past Medical History:  Diagnosis Date  . Acid reflux   . Anemia   . Arthritis   . Atrial fibrillation (HCC)   . BPH (benign prostatic hyperplasia)   . CAD (coronary artery disease)   . Colon polyps   . COPD (chronic obstructive pulmonary disease) (HCC)   . COVID-19   . Diverticulitis   . Diverticulosis   . Emphysema lung (HCC)   . GERD (gastroesophageal reflux disease)   . Gout   . Hemorrhoids   . Hiatal hernia   . Sleep apnea    CPAP  . Status post dilation of esophageal narrowing    Past Surgical History:  Procedure Laterality Date  . CARDIOVERSION N/A 12/01/2017   Procedure: CARDIOVERSION;  Surgeon: Jake Bathe, MD;  Location: [facility] ENDOSCOPY;  Service: Cardiovascular;  Laterality: N/A;  . CORONARY ANGIOGRAPHY N/A 11/02/2017   Procedure: CORONARY ANGIOGRAPHY (CATH LAB);  Surgeon: Tonny Bollman, MD;  Location: [facility] INVASIVE CV LAB;  Service: Cardiovascular;  Laterality: N/A;  . HEMORROIDECTOMY    . KIDNEY STONE SURGERY     Basket     Current Outpatient Medications  Medication Sig Dispense Refill  . albuterol (PROVENTIL HFA;VENTOLIN HFA) 108 (90 Base) MCG/ACT inhaler Inhale 2 puffs into the lungs every 4 (four) hours as needed for wheezing or shortness of breath.     Marland Kitchen apixaban (ELIQUIS) 5 MG TABS tablet Take 1 tablet (5 mg total) by mouth 2 (two) times daily. 180 tablet  2  . aspirin 81 MG EC tablet Take 1 tablet by mouth 3 (three) times a week. Monday, Wednesday, and Friday    . budesonide-formoterol (SYMBICORT) 160-4.5 MCG/ACT inhaler Inhale 2 puffs into the lungs 2 (two) times daily.    Marland Kitchen dofetilide (TIKOSYN) 250 MCG capsule Take 1 capsule (250 mcg total) by mouth 2 (two) times daily. 180 capsule 2  . ferrous sulfate 324 (65 Fe) MG TBEC Take 1 tablet by mouth daily.    . furosemide (LASIX) 80 MG tablet Take 1 tablet (80 mg total) by mouth every morning. 90 tablet 2  . gabapentin (NEURONTIN) 100 MG capsule Take 100 mg by mouth 2 (two) times daily.     .  isosorbide mononitrate (IMDUR) 30 MG 24 hr tablet Take 0.5 tablets (15 mg total) by mouth daily. 45 tablet 3  . Menthol, Topical Analgesic, (STOPAIN) 8 % LIQD Apply 1 application topically daily as needed (joint pain).    . metoprolol tartrate (LOPRESSOR) 25 MG tablet Take 1 tablet (25 mg total) by mouth 2 (two) times daily. 180 tablet 2  . omeprazole (PRILOSEC) 20 MG capsule Take 20 mg by mouth 2 (two) times daily.     . potassium chloride SA (KLOR-CON) 20 MEQ tablet Take 1 tablet (20 mEq total) by mouth daily. 90 tablet 2  . senna-docusate (SENOKOT S) 8.6-50 MG tablet Take 1 tablet by mouth at bedtime as needed for mild constipation.    . sertraline (ZOLOFT) 25 MG tablet Take 25 mg by mouth at bedtime.     . tamsulosin (FLOMAX) 0.4 MG CAPS capsule Take 0.4 mg by mouth daily.     . metolazone (ZAROXOLYN) 2.5 MG tablet Take 1 tablet (2.5 mg total) by mouth 2 (two) times a week. 24 tablet 1   No current facility-administered medications for this visit.    Allergies:   Shellfish allergy, Tape, Sulfa antibiotics, and Trazodone and nefazodone   Social History:  The patient  reports that he quit smoking about 46 years ago. He has quit using smokeless tobacco.  His smokeless tobacco use included chew. He reports that he does not drink alcohol or use drugs.   Family History:  The patient's family history includes Atrial fibrillation in his brother; Breast cancer in his daughter; CVA in his father; Dementia in his sister and sister; Kidney cancer in his daughter; Other in his mother; Pancreatic cancer in his brother; Stomach cancer in his brother.   ROS:  Please see the history of present illness.   Otherwise, review of systems is positive for none.   All other systems are reviewed and negative.   PHYSICAL EXAM: VS:  BP 108/66   Pulse (!) 59   Ht 5' 3.5" (1.613 m)   Wt 194 lb (88 kg)   BMI 33.83 kg/m  , BMI Body mass index is 33.83 kg/m. GEN: Well nourished, well developed, in no acute distress   HEENT: normal  Neck: no JVD, carotid bruits, or masses Cardiac: RRR; no murmurs, rubs, or gallops,no edema  Respiratory:  clear to auscultation bilaterally, normal work of breathing GI: soft, nontender, nondistended, + BS MS: no deformity or atrophy  Skin: warm and dry Neuro:  Strength and sensation are intact Psych: euthymic mood, full affect  EKG:  EKG is ordered today. Personal review of the ekg ordered shows sinus rhythm, rate 59, first-degree AV block  Recent Labs: 11/08/2018: ALT 15; Hemoglobin 12.9; Platelets 106 12/02/2018: BUN 23; Creatinine, Ser 1.07; Potassium 4.0; Sodium 142  Lipid Panel  No results found for: CHOL, TRIG, HDL, CHOLHDL, VLDL, LDLCALC, LDLDIRECT   Wt Readings from Last 3 Encounters:  04/10/19 194 lb (88 kg)  01/16/19 193 lb 3.2 oz (87.6 kg)  01/16/19 193 lb 2 oz (87.6 kg)      Other studies Reviewed: Additional studies/ records that were reviewed today include: Myoview 06/29/2017 Review of the above records today demonstrates:  No inducible ischemia No wall motion abnormalities Normal left ventricular function  TTE 10/01/17 - Left ventricle: The cavity size was normal. There was mild   concentric hypertrophy. Systolic function was mildly reduced. The   estimated ejection fraction was in the range of 45% to 50%.   Diffuse hypokinesis. - Aortic valve: Transvalvular velocity was within the normal range.   There was no stenosis. There was no regurgitation. - Mitral valve: Moderately calcified annulus. Calcification.   Transvalvular velocity was within the normal range. There was no   evidence for stenosis. There was trivial regurgitation. - Left atrium: The atrium was moderately dilated. - Right ventricle: The cavity size was normal. Wall thickness was   normal. Systolic function was normal. - Tricuspid valve: There was mild regurgitation. - Pulmonary arteries: Systolic pressure was within the normal   range. PA peak pressure: 18 mm Hg  (S).  LHC 11/02/17  Ost 2nd Mrg to 2nd Mrg lesion is 30% stenosed.  Prox LAD to Mid LAD lesion is 100% stenosed.   1.  Total occlusion of the LAD just after the first diagonal branch 2.  Minor nonobstructive disease involving the left main, left circumflex, and RCA   ASSESSMENT AND PLAN:  1.  Persistent atrial fibrillation: Currently on Eliquis and dofetilide.  CHA2DS2-VASc of 3.  He remains in sinus rhythm.  No changes.   2.  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  Has chronic stable angina on Imdur.  No current chest pain.  3.  Lower extremity edema: Continues to have issues with lower extremity edema.  He is on a high dose of Lasix.  This is likely due to diastolic heart failure.  We Joellyn Grandt plan to have him take metolazone 2.5 mg twice a day.  We Anetra Czerwinski have him follow-up with his primary physician for a basic metabolic to check his kidney function as well as his potassium.  We Dayane Hillenburg need to keep his potassium between 4 and 5 as he is on dofetilide.  His daughter Shan Padgett call us back if this does not improve his lower extremity edema.  Current medicines are reviewed at length with the patient today.   The patient does not have concerns regarding his medicines.  The following changes were made today: Add metolazone  Labs/ tests ordered today include:  Orders Placed This Encounter  Procedures  . EKG 12-Lead     Disposition:   FU with Donnielle Addison 3 months  Signed, Vieva Brummitt Meredith Leeds, MD  04/10/2019 1:49 PM     [address] [place] [address] [phone] (office) [phone] (fax)

## 2019-04-27 ENCOUNTER — Telehealth: Payer: Self-pay | Admitting: Cardiology

## 2019-04-27 NOTE — Telephone Encounter (Signed)
Per review of lab results- Pt's potassium is 3.2.  Per daughter-PCP did not increase Pt's current potassium prescription.  Per daughter they advised "Pt should eat a banana every day".    Pt is on dofetilide.  Attempted to contact afib APP but they were unavailable.  Advised Pt's daughter to have Pt take an extra potassium tonight and an extra one with his normal dose tomorrow morning.  Advised by that time Dr. Elberta Fortis would be here and we can give them further direction.  Daughter thanked Engineer, civil (consulting) for [employer] with plan.  DOD in agreement with plan.

## 2019-04-27 NOTE — Telephone Encounter (Signed)
New Message:  Pt called and wanted to know if Dr Elberta Fortis received pt's lab results from Dr Maralyn Sago for 04-25-19. Daughter is very concerned, pt's Potassium is low.

## 2019-04-28 MED ORDER — POTASSIUM CHLORIDE CRYS ER 20 MEQ PO TBCR: 40.0000 meq | EXTENDED_RELEASE_TABLET | Freq: Every day | ORAL | 1 refills | Status: DC

## 2019-04-28 NOTE — Telephone Encounter (Signed)
Followed up w/ dtr. Advised to increase Potassium to 40 mg daily, follow up BMET in a week. dtr agreeable to plan and will have pt stop by PCP to obtain requested blood work. She will make sure we get result. dtr appreciates my follow up

## 2019-05-01 ENCOUNTER — Telehealth: Payer: Self-pay | Admitting: Cardiology

## 2019-05-01 NOTE — Telephone Encounter (Signed)
Patient's daughter called stating she needs an order sent to her father's PCP's office Dr. Sharon Seller, so they can check his potassium.

## 2019-05-01 NOTE — Telephone Encounter (Signed)
Returned call dtr. Informed that I would re-fax order tomorrow morning. Patient verbalized understanding and agreeable to plan.

## 2019-05-02 NOTE — Telephone Encounter (Signed)
Order faxed around 9:30 am this morning, confirmation received. Left message informing dtr

## 2019-05-08 ENCOUNTER — Telehealth: Payer: Self-pay | Admitting: Cardiology

## 2019-05-08 NOTE — Telephone Encounter (Signed)
Patient's daughter, [name], states she is requesting to speak with Dr. Gershon Crane nurse to inquire about when the patient will need to have lab work completed again. Please advise.

## 2019-05-10 NOTE — Telephone Encounter (Signed)
  Daughter is calling back to follow up to see when her dad is due for lab work again. She states they usually get it done at his PCP so they don't have to drive here to have it done and he is supposed to be going to get labs done there today at 2 pm. Is there anything he needs to have done for our office?

## 2019-05-10 NOTE — Telephone Encounter (Signed)
lmtcb

## 2019-05-11 NOTE — Telephone Encounter (Signed)
Patient returning Sherri's call. She wants to see if you received the fax for lab results, she had the office fax it again today.

## 2019-05-12 ENCOUNTER — Telehealth: Payer: Self-pay | Admitting: Cardiology

## 2019-05-12 DIAGNOSIS — Z79899 Other long term (current) drug therapy: Secondary | ICD-10-CM

## 2019-05-12 DIAGNOSIS — E876 Hypokalemia: Secondary | ICD-10-CM

## 2019-05-12 NOTE — Telephone Encounter (Signed)
Spoke to dtr. She reports that dad went to doc today who did some lab work (she did not know there were going to do blood work).  Pts wife got a call from MD office that pt K+ was low and kidney function abn, but they did not give them numbers.  So they are unsure how abn the result is.  She is not sure what to do. Advised to call PCP on call MD to discuss (result was to be sent to PCP). She and I agreed to speak Monday to follow up on this. She appreciates my return call

## 2019-05-12 NOTE — Telephone Encounter (Signed)
New message:    Patient daughter would like for some one to call concering her father potasium being low

## 2019-05-12 NOTE — Telephone Encounter (Signed)
Spoke to dtr. Informed that I have not received lab result from PCP and I will reach out to request directly from them.  She appreciates it. Advised I would send order for them to draw BMET on Monday to ensure stable labs. dtr agreeable to plan.

## 2019-05-15 NOTE — Telephone Encounter (Signed)
Follow Up:    Daughter called and said Ronnie Torres did have blood work on Friday. The lab results are being sent here today. She wants to make sure Dr Elberta Fortis gets them today please. Ronnie Torres need to be advised asap how to take his Potassium.

## 2019-05-16 NOTE — Telephone Encounter (Signed)
Dtr reports that K+ Friday was 3.2.  They were instructed to take extra K+ over the weekend.  Creatinine also creeping up, it was 1.82. Advised to have follow up lab this Thursday/Friday and will determine treatment plan following blood work. [name], dtr, agreeable to plan. Order faxed to PCP in [place] for labs.

## 2019-05-19 NOTE — Telephone Encounter (Signed)
Order for BMET faxed to Dr Isa Rankin office in [place], [place] per request of Dr Elberta Fortis nurse Roanna Raider Norma Fredrickson.  RN states she faxed earlier this week and PCP office say they did not receive.

## 2019-05-19 NOTE — Telephone Encounter (Signed)
Will forward to triage to assist in faxing BMET order to PCP office as I am working remotely today.

## 2019-05-19 NOTE — Telephone Encounter (Signed)
Patient's daughter calling in because she states her fathers PCP does not have lab orders that were faxed. He is going today to get lab work done - please refax to [phone]

## 2019-05-19 NOTE — Addendum Note (Signed)
Addended by: Alois Cliche on: 05/19/2019 12:15 PM   Modules accepted: Orders

## 2019-05-24 ENCOUNTER — Telehealth: Payer: Self-pay | Admitting: Cardiology

## 2019-05-24 NOTE — Telephone Encounter (Signed)
° °  Pt's daughter calling to follow up mychart message she sent on 04/26 regarding pt recent blood work.   Please call

## 2019-05-25 NOTE — Telephone Encounter (Signed)
Informed dtr that we received result. Reports dad is urinating "a lot" but edema seems to remain unchanged. Scheduled pt to see Otilio Saber, PA on Monday to further evaluate treatment plan and address K+ dosing.  dtr agreeable to plan.

## 2019-05-29 ENCOUNTER — Other Ambulatory Visit: Payer: Self-pay

## 2019-05-29 ENCOUNTER — Encounter: Payer: Self-pay | Admitting: Student

## 2019-05-29 ENCOUNTER — Ambulatory Visit (INDEPENDENT_AMBULATORY_CARE_PROVIDER_SITE_OTHER): Payer: Medicare Other | Admitting: Student

## 2019-05-29 VITALS — BP 114/60 | HR 66 | Ht 63.0 in | Wt 190.2 lb

## 2019-05-29 DIAGNOSIS — Z79899 Other long term (current) drug therapy: Secondary | ICD-10-CM

## 2019-05-29 DIAGNOSIS — I4811 Longstanding persistent atrial fibrillation: Secondary | ICD-10-CM | POA: Diagnosis not present

## 2019-05-29 DIAGNOSIS — R600 Localized edema: Secondary | ICD-10-CM

## 2019-05-29 DIAGNOSIS — I4819 Other persistent atrial fibrillation: Secondary | ICD-10-CM | POA: Diagnosis not present

## 2019-05-29 DIAGNOSIS — I509 Heart failure, unspecified: Secondary | ICD-10-CM

## 2019-05-29 MED ORDER — TORSEMIDE 20 MG PO TABS: 40.0000 mg | ORAL_TABLET | Freq: Every day | ORAL | 3 refills | Status: DC

## 2019-05-29 MED ORDER — METOLAZONE 2.5 MG PO TABS: 2.5000 mg | ORAL_TABLET | ORAL | 3 refills | Status: DC

## 2019-05-29 NOTE — Patient Instructions (Addendum)
Medication Instructions:  STOP LASIX TAKE METOLAZONE ON SATURDAY ONLY.Marland Kitchen  When you take Metolazone, take an extra Potassium (20 meq)  START TORSEMIDE 40 mg Daily (2 x 20 mg tablets) *If you need a refill on your cardiac medications before your next appointment, please call your pharmacy*   Lab Work:  [place] If you have labs (blood work) drawn today and your tests are completely normal, you will receive your results only by: Marland Kitchen MyChart Message (if you have MyChart) OR . A paper copy in the mail If you have any lab test that is abnormal or we need to change your treatment, we will call you to review the results.   Testing/Procedures: none   Follow-Up: At [facility], you and your health needs are our priority.  As part of our continuing mission to provide you with exceptional heart care, we have created designated Provider Care Teams.  These Care Teams include your primary Cardiologist (physician) and Advanced Practice Providers (APPs -  Physician Assistants and Nurse Practitioners) who all work together to provide you with the care you need, when you need it.  Your next appointment:   2 WEEK on Monday 06/12/19  The format for your next appointment:   In person  Provider:   Oda Kilts, PA   Other Instructions  Torsemide Oral Tablets What is this medicine? TORSEMIDE (TORE se mide) is a diuretic. It helps you make more urine and lose salt and water from your body. It treats swelling from heart, kidney, or liver disease. It also treats high blood pressure. This medicine may be used for other purposes; ask your health care provider or pharmacist if you have questions. COMMON BRAND NAME(S): Demadex What should I tell my health care provider before I take this medicine? They need to know if you have any of these conditions:  high or low levels of electrolytes, like magnesium, potassium, and sodium, in your blood  diabetes  gout  kidney disease  liver  disease  an unusual or allergic reaction to torsemide, povidone, other medicines, foods, dyes, or preservatives  pregnant or trying to get pregnant  breast-feeding How should I use this medicine? Take this drug by mouth with water. Take it as directed on the prescription label at the same time every day. Keep taking it unless your health care provider tells you to stop. Talk to your health care provider about the use of this drug in children. Special care may be needed. Overdosage: If you think you have taken too much of this medicine contact a poison control center or emergency room at once. NOTE: This medicine is only for you. Do not share this medicine with others. What if I miss a dose? If you miss a dose, take it as soon as you can. If it is almost time for your next dose, take only that dose. Do not take double or extra doses. What may interact with this medicine?  alcohol  aspirin and aspirin-like medicines  celecoxib  certain medicines for blood pressure, heart disease, irregular heartbeat  certain medicines for cholesterol like cholestyramine  certain medicines for diabetes  cisplatin  cyclosporine  ephedra  ginseng  lithium  medicines for infection like acyclovir, adefovir, amphotericin B, bacitracin, cidofovir, foscarnet, ganciclovir, gentamicin, pentamidine, vancomycin  medicines that relax muscles for surgery  NSAIDs, medicines for pain and inflammation, like ibuprofen or naproxen  other diuretics  pamidronate  probenecid  rifampin  steroid medicines like prednisone or cortisone  warfarin  zoledronic acid This list may not describe all possible interactions. Give your health care provider a list of all the medicines, herbs, non-prescription drugs, or dietary supplements you use. Also tell them if you smoke, drink alcohol, or use illegal drugs. Some items may interact with your medicine. What should I watch for while using this medicine? Visit  your health care provider for regular checks on your progress. Tell your health care provider if your symptoms do not start to get better or if they get worse. Check your blood pressure regularly. Ask your health care provider what your blood pressure should be. Also, find out when you should contact him or her. You may need blood work done while you are taking this drug. Do not treat yourself for coughs, colds, or pain while using this drug without asking your health care provider for advice. Some drugs may increase your blood pressure. This drug may increase blood sugar. Ask your health care provider if changes in diet or drugs are needed if you have diabetes. You may need to be on a special diet while you are taking this drug. Ask your health care provider. Also, find out how many glasses of fluids you need to drink each day. Check with your health care provider if you have severe diarrhea, nausea, and vomiting, or if you sweat a lot. The loss of too much body fluid may make it dangerous for you to take this drug. You may get drowsy or dizzy. Do not drive, use machinery, or do anything that needs mental alertness until you know how this drug affects you. Do not stand or sit up quickly, especially if you are an older patient. This reduces the risk of dizzy or fainting spells. Alcohol may interfere with the effects of this drug. Avoid alcoholic drinks. What side effects may I notice from receiving this medicine? Side effects that you should report to your doctor or health care professional as soon as possible:  allergic reactions (skin rash, itching or hives; swelling of the face, lips, or tongue)  decreased hearing, ringing in the ears  electrolyte imbalance (increased thirst; loss of appetite; severe diarrhea; unusual sweating; vomiting)  kidney injury (trouble passing urine or change in the amount of urine)  low potassium (trouble breathing, chest pain; dizziness; fast, irregular heartbeat;  feeling faints or lightheaded, falls; muscle cramps or pain) Side effects that usually do not require medical attention (report to your doctor or health care professional if they continue or are bothersome):  passing large amounts of urine  stomach pain This list may not describe all possible side effects. Call your doctor for medical advice about side effects. You may report side effects to FDA at 1-800-FDA-1088. Where should I keep my medicine? Keep out of the reach of children and pets. Store at room temperature between 15 and 30 degrees C (59 and 86 degrees F). Do not freeze. Throw away any unused drug after the expiration date. NOTE: This sheet is a summary. It may not cover all possible information. If you have questions about this medicine, talk to your doctor, pharmacist, or health care provider.  2020 Elsevier/Gold Standard (2018-09-29 19:52:40)

## 2019-05-29 NOTE — Progress Notes (Signed)
PCP:  Redmond Pulling, MD Primary Cardiologist: No primary care provider on file. Electrophysiologist: Will Meredith Leeds, MD   Ronnie Torres is a 86 y.o. male seen today for Will Meredith Leeds, MD for routine electrophysiology followup.  Since last being seen in our clinic the patient reports doing somewhat better as far as his swelling. Does not weight daily at home.  Main complaint today is labile potassium. Most recent BMET available is Cr 1.82 and K 3.2. Pt potassium increased that day. He sleeps with CPAP everynight. He does have SOB with his ADLs, which is chronic. Drinks < 2 L daily. He denies chest pain, palpitations, PND, orthopnea, nausea, vomiting, dizziness, syncope, or early satiety.  Past Medical History:  Diagnosis Date  . Acid reflux   . Anemia   . Arthritis   . Atrial fibrillation ([place])   . BPH (benign prostatic hyperplasia)   . CAD (coronary artery disease)   . Colon polyps   . COPD (chronic obstructive pulmonary disease) ([place])   . COVID-19   . Diverticulitis   . Diverticulosis   . Emphysema lung ([place])   . GERD (gastroesophageal reflux disease)   . Gout   . Hemorrhoids   . Hiatal hernia   . Sleep apnea    CPAP  . Status post dilation of esophageal narrowing    Past Surgical History:  Procedure Laterality Date  . CARDIOVERSION N/A 12/01/2017   Procedure: CARDIOVERSION;  Surgeon: Jerline Pain, MD;  [facility] ENDOSCOPY;  Service: Cardiovascular;  Laterality: N/A;  . CORONARY ANGIOGRAPHY N/A 11/02/2017   Procedure: CORONARY ANGIOGRAPHY (CATH LAB);  Surgeon: Sherren Mocha, MD;  Location: [facility] CV LAB;  Service: Cardiovascular;  Laterality: N/A;  . HEMORROIDECTOMY    . KIDNEY STONE SURGERY     Basket    Current Outpatient Medications  Medication Sig Dispense Refill  . albuterol (PROVENTIL HFA;VENTOLIN HFA) 108 (90 Base) MCG/ACT inhaler Inhale 2 puffs into the lungs every 4 (four) hours as needed for wheezing or shortness of breath.     Marland Kitchen apixaban  (ELIQUIS) 5 MG TABS tablet Take 1 tablet (5 mg total) by mouth 2 (two) times daily. 180 tablet 2  . aspirin 81 MG EC tablet Take 1 tablet by mouth 3 (three) times a week. Monday, Wednesday, and Friday    . budesonide-formoterol (SYMBICORT) 160-4.5 MCG/ACT inhaler Inhale 2 puffs into the lungs 2 (two) times daily.    Marland Kitchen dofetilide (TIKOSYN) 250 MCG capsule Take 1 capsule (250 mcg total) by mouth 2 (two) times daily. 180 capsule 2  . ferrous sulfate 324 (65 Fe) MG TBEC Take 2 tablets by mouth daily.     . furosemide (LASIX) 80 MG tablet Take 1 tablet (80 mg total) by mouth every morning. 90 tablet 2  . gabapentin (NEURONTIN) 100 MG capsule Take 100 mg by mouth 2 (two) times daily.     . isosorbide mononitrate (IMDUR) 30 MG 24 hr tablet Take 0.5 tablets (15 mg total) by mouth daily. 45 tablet 3  . Menthol, Topical Analgesic, (STOPAIN) 8 % LIQD Apply 1 application topically daily as needed (joint pain).    . metoprolol tartrate (LOPRESSOR) 25 MG tablet Take 1 tablet (25 mg total) by mouth 2 (two) times daily. 180 tablet 2  . omeprazole (PRILOSEC) 20 MG capsule Take 20 mg by mouth 2 (two) times daily.     . potassium chloride SA (KLOR-CON) 20 MEQ tablet Take 2 tablets (40 mEq total) by mouth daily. (Patient taking  differently: Take 3 mEq by mouth daily. ) 180 tablet 1  . senna-docusate (SENOKOT S) 8.6-50 MG tablet Take 1 tablet by mouth at bedtime as needed for mild constipation.    . sertraline (ZOLOFT) 25 MG tablet Take 25 mg by mouth at bedtime.     . tamsulosin (FLOMAX) 0.4 MG CAPS capsule Take 0.4 mg by mouth daily.     Melene Muller ON 06/03/2019] metolazone (ZAROXOLYN) 2.5 MG tablet Take 1 tablet (2.5 mg total) by mouth every Saturday. 30 tablet 3  . torsemide (DEMADEX) 20 MG tablet Take 2 tablets (40 mg total) by mouth daily. 180 tablet 3   No current facility-administered medications for this visit.    Allergies  Allergen Reactions  . Shellfish Allergy Hives  . Tape Other (See Comments)     "takes skin off"  . Sulfa Antibiotics Rash  . Trazodone And Nefazodone Rash    Social History   Socioeconomic History  . Marital status: Married    Spouse name: Not on file  . Number of children: 4  . Years of education: Not on file  . Highest education level: Not on file  Occupational History  . Occupation: retired  Tobacco Use  . Smoking status: Former Smoker    Quit date: 1975    Years since quitting: 46.3  . Smokeless tobacco: Former Neurosurgeon    Types: Chew  Substance and Sexual Activity  . Alcohol use: Never  . Drug use: Never  . Sexual activity: Not on file  Other Topics Concern  . Not on file  Social History Narrative  . Not on file   Social Determinants of Health   Financial Resource Strain:   . Difficulty of Paying Living Expenses:   Food Insecurity:   . Worried About Programme researcher, broadcasting/film/video in the Last Year:   . Barista in the Last Year:   Transportation Needs:   . Freight forwarder (Medical):   Marland Kitchen Lack of Transportation (Non-Medical):   Physical Activity:   . Days of Exercise per Week:   . Minutes of Exercise per Session:   Stress:   . Feeling of Stress :   Social Connections:   . Frequency of Communication with Friends and Family:   . Frequency of Social Gatherings with Friends and Family:   . Attends Religious Services:   . Active Member of Clubs or Organizations:   . Attends Banker Meetings:   Marland Kitchen Marital Status:   Intimate Partner Violence:   . Fear of Current or Ex-Partner:   . Emotionally Abused:   Marland Kitchen Physically Abused:   . Sexually Abused:      Review of Systems: General: No chills, fever, night sweats or weight changes  Cardiovascular:  No chest pain, dyspnea on exertion, edema, orthopnea, palpitations, paroxysmal nocturnal dyspnea Dermatological: No rash, lesions or masses Respiratory: No cough, dyspnea Urologic: No hematuria, dysuria Abdominal: No nausea, vomiting, diarrhea, bright red blood per rectum, melena, or  hematemesis Neurologic: No visual changes, weakness, changes in mental status All other systems reviewed and are otherwise negative except as noted above.  Physical Exam: Vitals:   05/29/19 1207  BP: 114/60  Pulse: 66  SpO2: 98%  Weight: 190 lb 3.2 oz (86.3 kg)  Height: 5\' 3"  (1.6 m)    GEN- The patient is elderly appearing, alert and oriented x 3 today.   HEENT: normocephalic, atraumatic; sclera clear, conjunctiva pink; hearing intact; oropharynx clear; neck supple, no JVP Lymph- no  cervical lymphadenopathy Lungs- Clear to ausculation bilaterally, normal work of breathing.  No wheezes, rales, rhonchi Heart- Regular rate and rhythm, no murmurs, rubs or gallops, PMI not laterally displaced GI- soft, non-tender, non-distended, bowel sounds present, no hepatosplenomegaly Extremities- Trace to 1+ edema 1/2 way to knees. No clubbing or cyanosis DP/PT/radial pulses 2+ bilaterally MS- no significant deformity or atrophy Skin- warm and dry, no rash or lesion Psych- euthymic mood, full affect Neuro- strength and sensation are intact  EKG is not ordered.   Additional studies reviewed include: Most recent labs, previous EP office notes, most recent Echo.   Assessment and Plan:  1. Persistent atrial fibrillation Current in NSR on tikosyn. EKG stable 03/2019. Continue eliquis for CHA2DS2VASC of 4    2. CAD with chronic stable angina Chronically occluded LAD  3. Chronic diastolic CHF NYHA III symptoms, appears his baseline Volume status looks OK today. Trace to 1+ edema 1/2 way to knees STOP lasix Start torsemide 40 mg daily in attempt to decrease metolazone use Change metolazone to once weekly, on saturdays with 20 meq of extra K.  Continue potassium 60 meq daily for now, adjust based on labs today.  BMET Mg today on tikosyn. BMET next week with change to torsemide  RTC 2 weeks for further adjustment prn.   Graciella Freer, PA-C  05/29/19 1:39 PM

## 2019-05-30 ENCOUNTER — Telehealth: Payer: Self-pay

## 2019-05-30 LAB — BASIC METABOLIC PANEL
BUN/Creatinine Ratio: 20 (ref 10–24)
BUN: 22 mg/dL (ref 8–27)
CO2: 26 mmol/L (ref 20–29)
Calcium: 8.8 mg/dL (ref 8.6–10.2)
Chloride: 104 mmol/L (ref 96–106)
Creatinine, Ser: 1.1 mg/dL (ref 0.76–1.27)
GFR calc Af Amer: 69 mL/min/{1.73_m2} (ref >59)
GFR calc non Af Amer: 60 mL/min/{1.73_m2} (ref >59)
Glucose: 95 mg/dL (ref 65–99)
Potassium: 4.1 mmol/L (ref 3.5–5.2)
Sodium: 141 mmol/L (ref 134–144)

## 2019-05-30 LAB — MAGNESIUM: Magnesium: 2.3 mg/dL (ref 1.6–2.3)

## 2019-05-30 MED ORDER — POTASSIUM CHLORIDE ER 20 MEQ PO TBCR: 60.0000 meq | EXTENDED_RELEASE_TABLET | Freq: Every day | ORAL | 3 refills | Status: DC

## 2019-05-30 NOTE — Telephone Encounter (Signed)
-----   Message from Graciella Freer, PA-C sent at 05/30/2019 10:05 AM EDT ----- Joen Laura continue K dur 60 meq daily until check next week.   Casimiro Needle [address] Junior, PA-C  05/30/2019 10:05 AM

## 2019-05-30 NOTE — Telephone Encounter (Signed)
I left the patient's daughter message to increase patient's potassium to 60 meq Daily.

## 2019-06-15 ENCOUNTER — Telehealth: Payer: Self-pay

## 2019-06-15 NOTE — Telephone Encounter (Signed)
I spoke to patient's PCP and they will fax labs.. 5/20

## 2019-06-16 ENCOUNTER — Encounter: Payer: Self-pay | Admitting: Student

## 2019-06-16 ENCOUNTER — Other Ambulatory Visit: Payer: Self-pay

## 2019-06-16 ENCOUNTER — Ambulatory Visit (INDEPENDENT_AMBULATORY_CARE_PROVIDER_SITE_OTHER): Payer: Medicare Other | Admitting: Student

## 2019-06-16 VITALS — BP 122/60 | HR 70 | Ht 63.5 in | Wt 189.0 lb

## 2019-06-16 DIAGNOSIS — Z79899 Other long term (current) drug therapy: Secondary | ICD-10-CM

## 2019-06-16 DIAGNOSIS — E876 Hypokalemia: Secondary | ICD-10-CM

## 2019-06-16 DIAGNOSIS — R600 Localized edema: Secondary | ICD-10-CM

## 2019-06-16 DIAGNOSIS — I4819 Other persistent atrial fibrillation: Secondary | ICD-10-CM

## 2019-06-16 DIAGNOSIS — I509 Heart failure, unspecified: Secondary | ICD-10-CM

## 2019-06-16 DIAGNOSIS — I4811 Longstanding persistent atrial fibrillation: Secondary | ICD-10-CM | POA: Diagnosis not present

## 2019-06-16 NOTE — Progress Notes (Signed)
PCP:  Jim Like, MD Primary Cardiologist: No primary care provider on file. Electrophysiologist: Will Jorja Loa, MD   Ronnie Torres is a 86 y.o. male seen today for Will Jorja Loa, MD for routine electrophysiology followup.  Since last being seen in our clinic the patient reports doing well overall. K has stabilized with medication adjustments. Creatinine stable in 1.0 - 1.2 range. Edema is stable as well. Daughter present today. He did have a fall the week after he was last seen. This in the setting of "swatting at bees with a tennis racquet" and losing his balance. He had large left arm hematoma that continues to heal. Did not hold Eliquis. Denies syncope, near syncope, or loss of consciousness. He has chronic SOB with ADLs. Unchanged. No chest pain.    Past Medical History:  Diagnosis Date  . Acid reflux   . Anemia   . Arthritis   . Atrial fibrillation ([facility])   . BPH (benign prostatic hyperplasia)   . CAD (coronary artery disease)   . Colon polyps   . COPD (chronic obstructive pulmonary disease) ([facility])   . COVID-19   . Diverticulitis   . Diverticulosis   . Emphysema lung ([facility])   . GERD (gastroesophageal reflux disease)   . Gout   . Hemorrhoids   . Hiatal hernia   . Sleep apnea    CPAP  . Status post dilation of esophageal narrowing    Past Surgical History:  Procedure Laterality Date  . CARDIOVERSION N/A 12/01/2017   Procedure: CARDIOVERSION;  Surgeon: Jake Bathe, MD;  Location: [facility] ENDOSCOPY;  Service: Cardiovascular;  Laterality: N/A;  . CORONARY ANGIOGRAPHY N/A 11/02/2017   Procedure: CORONARY ANGIOGRAPHY (CATH LAB);  Surgeon: Tonny Bollman, MD;  Location: [facility] INVASIVE CV LAB;  Service: Cardiovascular;  Laterality: N/A;  . HEMORROIDECTOMY    . KIDNEY STONE SURGERY     Basket    Current Outpatient Medications  Medication Sig Dispense Refill  . albuterol (PROVENTIL HFA;VENTOLIN HFA) 108 (90 Base) MCG/ACT inhaler Inhale 2 puffs into the lungs every 4  (four) hours as needed for wheezing or shortness of breath.     Marland Kitchen apixaban (ELIQUIS) 5 MG TABS tablet Take 1 tablet (5 mg total) by mouth 2 (two) times daily. 180 tablet 2  . aspirin 81 MG EC tablet Take 1 tablet by mouth 3 (three) times a week. Monday, Wednesday, and Friday    . budesonide-formoterol (SYMBICORT) 160-4.5 MCG/ACT inhaler Inhale 2 puffs into the lungs 2 (two) times daily.    Marland Kitchen dofetilide (TIKOSYN) 250 MCG capsule Take 1 capsule (250 mcg total) by mouth 2 (two) times daily. 180 capsule 2  . ferrous sulfate 324 (65 Fe) MG TBEC Take 2 tablets by mouth daily.     Marland Kitchen gabapentin (NEURONTIN) 100 MG capsule Take 100 mg by mouth 2 (two) times daily.     . isosorbide mononitrate (IMDUR) 30 MG 24 hr tablet Take 0.5 tablets (15 mg total) by mouth daily. 45 tablet 3  . Menthol, Topical Analgesic, (STOPAIN) 8 % LIQD Apply 1 application topically daily as needed (joint pain).    Marland Kitchen metolazone (ZAROXOLYN) 2.5 MG tablet Take 1 tablet (2.5 mg total) by mouth every Saturday. 30 tablet 3  . metoprolol tartrate (LOPRESSOR) 25 MG tablet Take 1 tablet (25 mg total) by mouth 2 (two) times daily. 180 tablet 2  . omeprazole (PRILOSEC) 20 MG capsule Take 20 mg by mouth 2 (two) times daily.     . polyethylene glycol (  MIRALAX / GLYCOLAX) 17 g packet Take 17 g by mouth daily as needed.    . potassium chloride 20 MEQ TBCR Take 60 mEq by mouth daily. 90 tablet 3  . senna-docusate (SENOKOT S) 8.6-50 MG tablet Take 1 tablet by mouth at bedtime as needed for mild constipation.    . sertraline (ZOLOFT) 25 MG tablet Take 25 mg by mouth at bedtime.     . tamsulosin (FLOMAX) 0.4 MG CAPS capsule Take 0.4 mg by mouth daily.     Marland Kitchen torsemide (DEMADEX) 20 MG tablet Take 2 tablets (40 mg total) by mouth daily. 180 tablet 3   No current facility-administered medications for this visit.    Allergies  Allergen Reactions  . Shellfish Allergy Hives  . Tape Other (See Comments)    "takes skin off"  . Sulfa Antibiotics Rash  .  Trazodone And Nefazodone Rash    Social History   Socioeconomic History  . Marital status: Married    Spouse name: Not on file  . Number of children: 4  . Years of education: Not on file  . Highest education level: Not on file  Occupational History  . Occupation: retired  Tobacco Use  . Smoking status: Former Smoker    Quit date: 1975    Years since quitting: 46.4  . Smokeless tobacco: Former Systems developer    Types: Chew  Substance and Sexual Activity  . Alcohol use: Never  . Drug use: Never  . Sexual activity: Not on file  Other Topics Concern  . Not on file  Social History Narrative  . Not on file   Social Determinants of Health   Financial Resource Strain:   . Difficulty of Paying Living Expenses:   Food Insecurity:   . Worried About Charity fundraiser in the Last Year:   . Arboriculturist in the Last Year:   Transportation Needs:   . Film/video editor (Medical):   Marland Kitchen Lack of Transportation (Non-Medical):   Physical Activity:   . Days of Exercise per Week:   . Minutes of Exercise per Session:   Stress:   . Feeling of Stress :   Social Connections:   . Frequency of Communication with Friends and Family:   . Frequency of Social Gatherings with Friends and Family:   . Attends Religious Services:   . Active Member of Clubs or Organizations:   . Attends Archivist Meetings:   Marland Kitchen Marital Status:   Intimate Partner Violence:   . Fear of Current or Ex-Partner:   . Emotionally Abused:   Marland Kitchen Physically Abused:   . Sexually Abused:      Review of Systems: Review of systems complete and found to be negative unless listed in HPI.    Physical Exam: Vitals:   06/16/19 1256  BP: 122/60  Pulse: 70  SpO2: 96%  Weight: 189 lb (85.7 kg)  Height: 5' 3.5" (1.613 m)   Wt Readings from Last 3 Encounters:  06/16/19 189 lb (85.7 kg)  05/29/19 190 lb 3.2 oz (86.3 kg)  04/10/19 194 lb (88 kg)   General: Elderly appearing. No resp difficulty. HEENT: Normal Neck:  Supple. JVP 6-7 cm. Carotids 2+ bilat; no bruits. No thyromegaly or nodule noted. Cor: PMI nondisplaced. RRR, No M/G/R noted Lungs: CTAB, normal effort. Abdomen: Soft, non-tender, non-distended, no HSM. No bruits or masses. +BS  Extremities: No cyanosis, clubbing, or rash. Trace to 1+ edema 1/2 to knees. Chronic.  Neuro: Alert & orientedx3,  cranial nerves grossly intact. moves all 4 extremities w/o difficulty. Affect pleasant   EKG is not ordered.   Additional studies reviewed include: Most recent labs, previous EP office notes, most recent Echo.   Assessment and Plan:  1. Persistent atrial fibrillation Regular on exam EKG stable 03/2019. RTC 3 months for continue follow of EKGs and electrolytes Continue eliquis for CHA2DS2VASC of at least 4. If has more falls, may need to consider stopping.    2. CAD with chronic stable angina Chronically occluded LAD.  No change.   3. Chronic diastolic CHF NYHA III at baseline.  Volume status looks OK centrall. Continue torsemide 40 mg daily with potassium 60 meq daily. BMET 5/12 stable.   RTC 3 months. Sooner with symptoms or need for further diuretic adjustment.   Graciella Freer, PA-C  06/16/19 1:05 PM

## 2019-06-16 NOTE — Patient Instructions (Addendum)
Medication Instructions:  NONE *If you need a refill on your cardiac medications before your next appointment, please call your pharmacy*   Lab Work: NONE If you have labs (blood work) drawn today and your tests are completely normal, you will receive your results only by: Marland Kitchen MyChart Message (if you have MyChart) OR . A paper copy in the mail If you have any lab test that is abnormal or we need to change your treatment, we will call you to review the results.   Testing/Procedures: NONE   Follow-Up: At [facility], you and your health needs are our priority.  As part of our continuing mission to provide you with exceptional heart care, we have created designated Provider Care Teams.  These Care Teams include your primary Cardiologist (physician) and Advanced Practice Providers (APPs -  Physician Assistants and Nurse Practitioners) who all work together to provide you with the care you need, when you need it.   Your next appointment:   3 months  The format for your next appointment:   In Person  Provider:   Dr Elberta Fortis   Other Instructions

## 2019-07-21 ENCOUNTER — Telehealth: Payer: Self-pay

## 2019-07-21 NOTE — Telephone Encounter (Signed)
**Note De-Identified Khari Lett Obfuscation** Letter received Pearson Reasons fax from [facility] [employer] Pt Asst stating that they approved the pt for asst with his Eliquis. Approval is valid until 01/26/2020 Application Case#: [id number]  The letter states that they have notified the pt of this approval as well.

## 2019-08-16 ENCOUNTER — Telehealth: Payer: Self-pay

## 2019-08-16 NOTE — Telephone Encounter (Signed)
**Note De-Identified Mckinley Adelstein Obfuscation** The pts daughter [name] left the pts completed [employer] Pt Asst application at the office along with documents.  I have completed the provide page of the application and emailed all to Dr [employer] nurse so she can obtain his signature, date it and to fax all to [employer] at fax number written on cover letter included.  [name] is requesting a call at [phone] once the pts application has been faxed to [employer].

## 2019-08-25 NOTE — Telephone Encounter (Signed)
Lisa notified yesterday via [employer]

## 2019-09-06 NOTE — Telephone Encounter (Signed)
**Note De-Identified Ronnie Torres Obfuscation** I called [employer] Pt asst Program to check the progress of this application and was advised by Raylene Everts that the pt has been approved for asst with his Tikosyn. Per Raylene Everts we should receive the pts Tikosyn (order#: [id number]) at Dr Gershon Crane office within 7 to 10 business days from today. Pt ID: [id number]  I have notified the pts daughter [name] through a [facility] message.

## 2019-09-07 NOTE — Telephone Encounter (Signed)
Called pt's daughter [name], to inform him that his medication Tikosyn, from [employer], was ready at our office for pt to pick up, there are 3 bottles of 60 tablets. Daughter verbalized understanding and said the she would pick up the medication today. FYI

## 2019-10-20 ENCOUNTER — Ambulatory Visit: Payer: Medicare Other | Admitting: Cardiology

## 2019-11-13 ENCOUNTER — Ambulatory Visit: Payer: Medicare Other | Admitting: Cardiology

## 2019-11-15 ENCOUNTER — Other Ambulatory Visit: Payer: Self-pay | Admitting: Cardiology

## 2019-12-01 NOTE — Telephone Encounter (Signed)
Dr. Elberta Fortis - I didn't have you look at this when you were here this week, I apologize.  Please review previous notes.  I believe I advised correctly, but let me know if you have different advisement.  Thanks

## 2019-12-01 NOTE — Telephone Encounter (Signed)
Left detailed message for dtr informing her to get w/ PCP about BP concern and Dr. Elberta Fortis would further discuss and help, if needed, w/ BPs once he sees her (establishes w/ her) end of this month.  Asked dtr to call back if she needed to further discuss this.

## 2019-12-06 ENCOUNTER — Other Ambulatory Visit (HOSPITAL_COMMUNITY): Payer: Self-pay

## 2019-12-06 ENCOUNTER — Ambulatory Visit (INDEPENDENT_AMBULATORY_CARE_PROVIDER_SITE_OTHER): Payer: Medicare Other | Admitting: Internal Medicine

## 2019-12-06 ENCOUNTER — Encounter: Payer: Self-pay | Admitting: Internal Medicine

## 2019-12-06 VITALS — BP 100/60 | HR 53 | Ht 63.5 in | Wt 187.0 lb

## 2019-12-06 DIAGNOSIS — R1310 Dysphagia, unspecified: Secondary | ICD-10-CM

## 2019-12-06 DIAGNOSIS — R054 Cough syncope: Secondary | ICD-10-CM

## 2019-12-06 DIAGNOSIS — R059 Cough, unspecified: Secondary | ICD-10-CM

## 2019-12-06 DIAGNOSIS — R55 Syncope and collapse: Secondary | ICD-10-CM

## 2019-12-06 DIAGNOSIS — R1312 Dysphagia, oropharyngeal phase: Secondary | ICD-10-CM

## 2019-12-06 NOTE — Patient Instructions (Signed)
You have been scheduled for a modified barium swallow on 12/11/2019 at 11:30am. Please arrive 15 minutes prior to your test for registration. You will go to [facility] Radiology (1st Floor) for your appointment. Should you need to cancel or reschedule your appointment, please contact [phone] [facility]) _____________________________________________________________________ A Modified Barium Swallow Study, or MBS, is a special x-ray that is taken to check swallowing skills. It is carried out by a Marine scientist and a Warehouse manager (SLP). During this test, yourmouth, throat, and esophagus, a muscular tube which connects your mouth to your stomach, is checked. The test will help you, your doctor, and the SLP plan what types of foods and liquids are easier for you to swallow. The SLP will also identify positions and ways to help you swallow more easily and safely. What will happen during an MBS? You will be taken to an x-ray room and seated comfortably. You will be asked to swallow small amounts of food and liquid mixed with barium. Barium is a liquid or paste that allows images of your mouth, throat and esophagus to be seen on x-ray. The x-ray captures moving images of the food you are swallowing as it travels from your mouth through your throat and into your esophagus. This test helps identify whether food or liquid is entering your lungs (aspiration). The test also shows which part of your mouth or throat lacks strength or coordination to move the food or liquid in the right direction. This test typically takes 30 minutes to 1 hour to complete. _______________________________________________________________________

## 2019-12-06 NOTE — Progress Notes (Signed)
HISTORY OF PRESENT ILLNESS:  Ronnie Torres is a 86 y.o. male with multiple significant medical problems including COPD, coronary artery disease, atrial fibrillation on Eliquis, GERD complicated by peptic stricture requiring multiple esophageal dilations elsewhere, hemorrhoidal bleeding, prior Covid infection, C2 spine fracture associated with fall (last year), and diverticulitis.  Patient is accompanied today by his daughter [name] (my patient).  He was last seen as a new patient to this GI practice January 16, 2019.  The issues at that time a recent hospitalization for diverticulitis, presumed hemorrhoidal bleeding, diverticulosis with sigmoid stenosis, and history of complicated GERD.  See that dictation for details.  Since that time he has recovered from spinal fracture.  Chief complaint today is that of coughing or choking spells with liquids and pills "easily strangled" associated on several occasions with syncope.  They describe approximately 5 such episodes this year.  He does have some esophageal dysphagia, though this is mild.  He does continue on Prilosec 20 mg twice daily for GERD.  He also asks about abdominal diastases.  GI review of systems is otherwise negative  REVIEW OF SYSTEMS:  All non-GI ROS negative except for hard of hearing  Past Medical History:  Diagnosis Date  . Acid reflux   . Anemia   . Arthritis   . Atrial fibrillation (HCC)   . BPH (benign prostatic hyperplasia)   . CAD (coronary artery disease)   . Colon polyps   . COPD (chronic obstructive pulmonary disease) (HCC)   . COVID-19   . Diverticulitis   . Diverticulosis   . Emphysema lung (HCC)   . GERD (gastroesophageal reflux disease)   . Gout   . Hemorrhoids   . Hiatal hernia   . Sleep apnea    CPAP  . Status post dilation of esophageal narrowing     Past Surgical History:  Procedure Laterality Date  . CARDIOVERSION N/A 12/01/2017   Procedure: CARDIOVERSION;  Surgeon: Jake Bathe, MD;  Location: [facility]  ENDOSCOPY;  Service: Cardiovascular;  Laterality: N/A;  . CORONARY ANGIOGRAPHY N/A 11/02/2017   Procedure: CORONARY ANGIOGRAPHY (CATH LAB);  Surgeon: Tonny Bollman, MD;  Location: [facility] INVASIVE CV LAB;  Service: Cardiovascular;  Laterality: N/A;  . HEMORROIDECTOMY    . KIDNEY STONE SURGERY     Basket    Social History Harshaan Whang  reports that he quit smoking about 46 years ago. He has quit using smokeless tobacco.  His smokeless tobacco use included chew. He reports that he does not drink alcohol and does not use drugs.  family history includes Atrial fibrillation in his brother; Breast cancer in his daughter; CVA in his father; Dementia in his sister and sister; Kidney cancer in his daughter; Other in his mother; Pancreatic cancer in his brother; Stomach cancer in his brother.  Allergies  Allergen Reactions  . Shellfish Allergy Hives  . Tape Other (See Comments)    "takes skin off"  . Sulfa Antibiotics Rash  . Trazodone And Nefazodone Rash       PHYSICAL EXAMINATION: Vital signs: BP 100/60   Pulse (!) 53   Ht 5' 3.5" (1.613 m)   Wt 187 lb (84.8 kg)   BMI 32.61 kg/m   Constitutional: Pleasant chronically ill-appearing elderly male, no acute distress Psychiatric: alert and oriented x3, cooperative.  Hard of hearing Eyes: extraocular movements intact, anicteric, conjunctiva pink Mouth: oral pharynx moist, no lesions Neck: supple no lymphadenopathy Cardiovascular: heart regular rate and rhythm, no murmur Lungs: clear to auscultation bilaterally Abdomen: soft, nontender,  nondistended, no obvious ascites, no peritoneal signs, normal bowel sounds, no organomegaly.  Abdominal diastases noted Rectal: Omitted Extremities: no clubbing or cyanosis.  Trace lower extremity edema bilaterally Skin: no lesions on visible extremities Neuro: No focal deficits.  Cranial nerves intact  ASSESSMENT:  1.  Oropharyngeal dysphagia with aspiration/penetration events 2.  Cough associated  syncope 3.  GERD with a history of peptic stricture.  Mild esophageal dysphagia.  This is not the principal problem 4.  MULTIPLE significant medical problems.  Advanced age   PLAN:  1.  Modified barium swallow with speech pathology.  Evaluate oropharyngeal dysphagia.  Hopefully make suggestions (such things as changing the consistency of foods or liquids, chin tuck maneuver) which might reduce the episodes of aspiration/penetration events. 2.  Recommended that the patient be sitting in the chair at all times when eating, consuming liquids, or taking his pills. 3.  We are seeing the cardiologist in the near future.  Reviewed the need for anticoagulation given episodes syncope. Time of 30 minutes was spent preparing to see the patient, reviewing test, obtaining history, performing physical examination, counseling the patient and his daughter regarding his issues, ordering advanced radiology/speech pathology studies, and documenting clinical information in the health record

## 2019-12-11 ENCOUNTER — Other Ambulatory Visit: Payer: Self-pay

## 2019-12-11 ENCOUNTER — Ambulatory Visit (HOSPITAL_COMMUNITY)
Admission: RE | Admit: 2019-12-11 | Discharge: 2019-12-11 | Disposition: A | Discharge status: Home / Self Care | Payer: Medicare Other | Source: Ambulatory Visit | Attending: Internal Medicine | Admitting: Internal Medicine

## 2019-12-11 DIAGNOSIS — R059 Cough, unspecified: Secondary | ICD-10-CM | POA: Diagnosis present

## 2019-12-11 DIAGNOSIS — R1310 Dysphagia, unspecified: Secondary | ICD-10-CM | POA: Diagnosis present

## 2019-12-11 DIAGNOSIS — R1312 Dysphagia, oropharyngeal phase: Secondary | ICD-10-CM

## 2019-12-13 ENCOUNTER — Telehealth: Payer: Self-pay

## 2019-12-13 ENCOUNTER — Other Ambulatory Visit: Payer: Self-pay | Admitting: Cardiology

## 2019-12-13 NOTE — Telephone Encounter (Signed)
°*  STAT* If patient is at the pharmacy, call can be transferred to refill team.   1. Which medications need to be refilled? (please list name of each medication and dose if known) dofetilide (TIKOSYN) 250 MCG capsule  2. Which pharmacy/location (including street and city if local pharmacy) is medication to be sent to? Picks up at the office  3. Do they need a 30 day or 90 day supply? 90 day

## 2019-12-13 NOTE — Telephone Encounter (Signed)
**Note De-Identified Ronnie Torres Obfuscation** Per a [facility] message received from the pts daughter [name] called [employer] at [phone] to order the pts next shipment of Tikosyn with [name]. Per [name] the pts Tikosyn will be shipped to the office within 7 to 10 business days. Order# [id number] Pt ID: [id number]  I did advise [name] through the [facility] message that it is time for the pt to re-apply for asst for his Tikosyn through [employer] as his current application approval will expire 01/26/2020.

## 2019-12-13 NOTE — Telephone Encounter (Signed)
Hey this pt wants to pick up Rx at the office, Dr. Elberta Fortis has to sign the Rx. Please advise

## 2019-12-14 MED ORDER — DOFETILIDE 250 MCG PO CAPS: 250.0000 ug | ORAL_CAPSULE | Freq: Two times a day (BID) | ORAL | 2 refills | Status: DC

## 2019-12-14 NOTE — Telephone Encounter (Signed)
Called pt's daughter to inform her that the pt's Rx was ready to be picked up at the front desk and if the pt has any other problems, questions or concerns, to give our office a call back. Daughter verbalized understanding.

## 2019-12-14 NOTE — Telephone Encounter (Signed)
Signed Rx given to refill team, she will reach out to dtr today and let her know ready for pick up.

## 2019-12-18 NOTE — Telephone Encounter (Addendum)
Called pt's daughter [name], to inform her that the pt's medication Tikosyn 250 mcg, Lot# [id number] Exp: 12/2020 3 bottles of 60 capsules, arrived at the office from [employer] patient assistance program, was ready for them to pick up and that I would leave them at the front desk at the office to pick up. Pt's daughter verbalized understanding. FYI

## 2019-12-23 NOTE — Progress Notes (Signed)
Electrophysiology Office Note   Date:  12/25/2019   ID:  Ronnie Torres, DOB [birth year], MRN [id number]  PCP:  Jim Like, MD  Cardiologist:   Primary Electrophysiologist:  Joshiah Traynham Jorja Loa, MD    No chief complaint on file.    History of Present Illness: Ronnie Torres is a 86 y.o. male who is being seen today for the evaluation of atrial fibrillation at the request of Atha Starks. Presenting today for electrophysiology evaluation.    He has a history of paroxysmal atrial fibrillation, GERD, COPD, BPH, and lower extremity edema.  He was initially on amiodarone, but did not stay in normal rhythm.  He has been switched to dofetilide.  He developed chest pain previously and had a left heart catheterization which showed a chronically occluded LAD and no intervention was performed.  Today, denies symptoms of palpitations, chest pain, shortness of breath, orthopnea, PND, lower extremity edema, claudication, dizziness, presyncope, bleeding, or neurologic sequela. The patient is tolerating medications without difficulties.  He has been having a few episodes of syncope.  His syncopal episodes occur mainly when he is coughing.  He does have a history of esophageal stenosis.  It is happened 5 or 6 times over the last year and a half.  He did have a fall approximately 14 months ago and had a C2 fracture.  He had no spinal cord injury.  He also has shortness of breath and fatigue.  He is in atrial fibrillation today.  Is certainly possible that his shortness of breath and fatigue are due to both combination of his atrial fibrillation and his COPD.  Past Medical History:  Diagnosis Date  . Acid reflux   . Anemia   . Arthritis   . Atrial fibrillation (HCC)   . BPH (benign prostatic hyperplasia)   . CAD (coronary artery disease)   . Colon polyps   . COPD (chronic obstructive pulmonary disease) (HCC)   . COVID-19   . Diverticulitis   . Diverticulosis   . Emphysema lung (HCC)   . GERD  (gastroesophageal reflux disease)   . Gout   . Hemorrhoids   . Hiatal hernia   . Sleep apnea    CPAP  . Status post dilation of esophageal narrowing    Past Surgical History:  Procedure Laterality Date  . CARDIOVERSION N/A 12/01/2017   Procedure: CARDIOVERSION;  Surgeon: Jake Bathe, MD;  Location: [facility] ENDOSCOPY;  Service: Cardiovascular;  Laterality: N/A;  . CORONARY ANGIOGRAPHY N/A 11/02/2017   Procedure: CORONARY ANGIOGRAPHY (CATH LAB);  Surgeon: Tonny Bollman, MD;  Location: [facility] INVASIVE CV LAB;  Service: Cardiovascular;  Laterality: N/A;  . HEMORROIDECTOMY    . KIDNEY STONE SURGERY     Basket     Current Outpatient Medications  Medication Sig Dispense Refill  . albuterol (PROVENTIL HFA;VENTOLIN HFA) 108 (90 Base) MCG/ACT inhaler Inhale 2 puffs into the lungs every 4 (four) hours as needed for wheezing or shortness of breath.     Marland Kitchen apixaban (ELIQUIS) 5 MG TABS tablet Take 1 tablet (5 mg total) by mouth 2 (two) times daily. 180 tablet 2  . aspirin 81 MG EC tablet Take 1 tablet by mouth 3 (three) times a week. Monday, Wednesday, and Friday    . budesonide-formoterol (SYMBICORT) 160-4.5 MCG/ACT inhaler Inhale 2 puffs into the lungs 2 (two) times daily.    Marland Kitchen dofetilide (TIKOSYN) 250 MCG capsule Take 1 capsule (250 mcg total) by mouth 2 (two) times daily. 180 capsule 2  . ferrous sulfate  324 (65 Fe) MG TBEC Take 2 tablets by mouth daily.     Marland Kitchen gabapentin (NEURONTIN) 100 MG capsule Take 100 mg by mouth 2 (two) times daily.     . isosorbide mononitrate (IMDUR) 30 MG 24 hr tablet Take 0.5 tablets (15 mg total) by mouth daily. 45 tablet 3  . Menthol, Topical Analgesic, (STOPAIN) 8 % LIQD Apply 1 application topically daily as needed (joint pain).    Marland Kitchen metolazone (ZAROXOLYN) 2.5 MG tablet Take 1 tablet (2.5 mg total) by mouth every Saturday. 30 tablet 3  . metoprolol tartrate (LOPRESSOR) 25 MG tablet Take 1 tablet by mouth twice daily 180 tablet 0  . omeprazole (PRILOSEC) 20 MG capsule  Take 20 mg by mouth 2 (two) times daily.     . polyethylene glycol (MIRALAX / GLYCOLAX) 17 g packet Take 17 g by mouth daily as needed.    . potassium chloride 20 MEQ TBCR Take 60 mEq by mouth daily. (Patient taking differently: Take 90 mEq by mouth daily. ) 90 tablet 3  . senna-docusate (SENOKOT S) 8.6-50 MG tablet Take 1 tablet by mouth at bedtime as needed for mild constipation.    . sertraline (ZOLOFT) 25 MG tablet Take 25 mg by mouth at bedtime.     . tamsulosin (FLOMAX) 0.4 MG CAPS capsule Take 0.4 mg by mouth daily.     Marland Kitchen torsemide (DEMADEX) 20 MG tablet Take 2 tablets (40 mg total) by mouth daily. 180 tablet 3   No current facility-administered medications for this visit.    Allergies:   Shellfish allergy, Tape, Sulfa antibiotics, and Trazodone and nefazodone   Social History:  The patient  reports that he quit smoking about 46 years ago. He has quit using smokeless tobacco.  His smokeless tobacco use included chew. He reports that he does not drink alcohol and does not use drugs.   Family History:  The patient's family history includes Atrial fibrillation in his brother; Breast cancer in his daughter; CVA in his father; Dementia in his sister and sister; Kidney cancer in his daughter; Other in his mother; Pancreatic cancer in his brother; Stomach cancer in his brother.   ROS:  Please see the history of present illness.   Otherwise, review of systems is positive for none.   All other systems are reviewed and negative.   PHYSICAL EXAM: VS:  BP 104/68   Pulse 95   Ht 5' 3.5" (1.613 m)   Wt 189 lb (85.7 kg)   SpO2 96%   BMI 32.95 kg/m  , BMI Body mass index is 32.95 kg/m. GEN: Well nourished, well developed, in no acute distress  HEENT: normal  Neck: no JVD, carotid bruits, or masses Cardiac: Irregular; no murmurs, rubs, or gallops,no edema  Respiratory:  clear to auscultation bilaterally, normal work of breathing GI: soft, nontender, nondistended, + BS MS: no deformity or  atrophy  Skin: warm and dry Neuro:  Strength and sensation are intact Psych: euthymic mood, full affect  EKG:  EKG is ordered today. Personal review of the ekg ordered shows atrial fibrillation, rate 95, PVC  Recent Labs: 05/29/2019: BUN 22; Creatinine, Ser 1.10; Magnesium 2.3; Potassium 4.1; Sodium 141    Lipid Panel  No results found for: CHOL, TRIG, HDL, CHOLHDL, VLDL, LDLCALC, LDLDIRECT   Wt Readings from Last 3 Encounters:  12/25/19 189 lb (85.7 kg)  12/06/19 187 lb (84.8 kg)  06/16/19 189 lb (85.7 kg)      Other studies Reviewed: Additional studies/ records  that were reviewed today include: Myoview 06/29/2017 Review of the above records today demonstrates:  No inducible ischemia No wall motion abnormalities Normal left ventricular function  TTE 10/01/17 - Left ventricle: The cavity size was normal. There was mild   concentric hypertrophy. Systolic function was mildly reduced. The   estimated ejection fraction was in the range of 45% to 50%.   Diffuse hypokinesis. - Aortic valve: Transvalvular velocity was within the normal range.   There was no stenosis. There was no regurgitation. - Mitral valve: Moderately calcified annulus. Calcification.   Transvalvular velocity was within the normal range. There was no   evidence for stenosis. There was trivial regurgitation. - Left atrium: The atrium was moderately dilated. - Right ventricle: The cavity size was normal. Wall thickness was   normal. Systolic function was normal. - Tricuspid valve: There was mild regurgitation. - Pulmonary arteries: Systolic pressure was within the normal   range. PA peak pressure: 18 mm Hg (S).  LHC 11/02/17  Ost 2nd Mrg to 2nd Mrg lesion is 30% stenosed.  Prox LAD to Mid LAD lesion is 100% stenosed.   1.  Total occlusion of the LAD just after the first diagonal branch 2.  Minor nonobstructive disease involving the left main, left circumflex, and RCA   ASSESSMENT AND PLAN:  1.   Persistent atrial fibrillation: Currently on Eliquis and dofetilide, monitoring for high risk medication.  CHA2DS2-VASc of 3.  Unfortunately he is in atrial fibrillation today.  Due to that, we Breana Litts plan for cardioversion.  He also has been having issues with multiple medications.  We Nicha Hemann stop his metoprolol and start him on Toprol-XL 50 mg.   2.  Coronary artery disease with chronic stable angina: Has a chronically occluded LAD.  Currently on Imdur.  3.  Lower extremity edema: Improved.  4.  Syncope: Likely due to a vagal episode while he is coughing.  That being said, due to his injury history, Sulamita Lafountain plan for 30-day monitoring.  Current medicines are reviewed at length with the patient today.   The patient does not have concerns regarding his medicines.  The following changes were made today: Stop metoprolol, start Toprol-XL  Labs/ tests ordered today include:  Orders Placed This Encounter  Procedures  . EKG 12-Lead     Disposition:   FU with Sylver Vantassell 6 months  Signed, Norene Oliveri Jorja Loa, MD  12/25/2019 11:52 AM     [facility] [address] [place] [address] [phone] (office) [phone] (fax)

## 2019-12-25 ENCOUNTER — Other Ambulatory Visit: Payer: Self-pay

## 2019-12-25 ENCOUNTER — Ambulatory Visit: Payer: Medicare Other | Admitting: Cardiology

## 2019-12-25 ENCOUNTER — Encounter: Payer: Self-pay | Admitting: Cardiology

## 2019-12-25 ENCOUNTER — Ambulatory Visit (INDEPENDENT_AMBULATORY_CARE_PROVIDER_SITE_OTHER): Payer: Medicare Other | Admitting: Cardiology

## 2019-12-25 VITALS — BP 104/68 | HR 95 | Ht 63.5 in | Wt 189.0 lb

## 2019-12-25 DIAGNOSIS — Z01812 Encounter for preprocedural laboratory examination: Secondary | ICD-10-CM

## 2019-12-25 DIAGNOSIS — I4819 Other persistent atrial fibrillation: Secondary | ICD-10-CM

## 2019-12-25 DIAGNOSIS — R55 Syncope and collapse: Secondary | ICD-10-CM

## 2019-12-25 LAB — CBC
Hematocrit: 38.7 % (ref 37.5–51.0)
Hemoglobin: 13.1 g/dL (ref 13.0–17.7)
MCH: 29.6 pg (ref 26.6–33.0)
MCHC: 33.9 g/dL (ref 31.5–35.7)
MCV: 88 fL (ref 79–97)
Platelets: 148 10*3/uL — ABNORMAL LOW (ref 150–450)
RBC: 4.42 x10E6/uL (ref 4.14–5.80)
RDW: 13.8 % (ref 11.6–15.4)
WBC: 6.8 10*3/uL (ref 3.4–10.8)

## 2019-12-25 LAB — BASIC METABOLIC PANEL
BUN/Creatinine Ratio: 19 (ref 10–24)
BUN: 23 mg/dL (ref 8–27)
CO2: 28 mmol/L (ref 20–29)
Calcium: 9.5 mg/dL (ref 8.6–10.2)
Chloride: 100 mmol/L (ref 96–106)
Creatinine, Ser: 1.24 mg/dL (ref 0.76–1.27)
GFR calc Af Amer: 60 mL/min/{1.73_m2} (ref >59)
GFR calc non Af Amer: 52 mL/min/{1.73_m2} — ABNORMAL LOW (ref >59)
Glucose: 86 mg/dL (ref 65–99)
Potassium: 3.7 mmol/L (ref 3.5–5.2)
Sodium: 141 mmol/L (ref 134–144)

## 2019-12-25 MED ORDER — METOPROLOL SUCCINATE ER 50 MG PO TB24: 50.0000 mg | ORAL_TABLET | Freq: Every day | ORAL | 6 refills | Status: DC

## 2019-12-25 NOTE — Patient Instructions (Addendum)
Medication Instructions:  Your physician has recommended you make the following change in your medication:  1. STOP Metoprolol Tartrate (Lopressor) 2. START Metoprolol Succinate (Toprol) 50 mg daily at bedtime   *If you need a refill on your cardiac medications before your next appointment, please call your pharmacy*   Lab Work: Today: BMET & CBC If you have labs (blood work) drawn today and your tests are completely normal, you will receive your results only by: Marland Kitchen MyChart Message (if you have MyChart) OR . A paper copy in the mail If you have any lab test that is abnormal or we need to change your treatment, we will call you to review the results.   Testing/Procedures: Your physician has recommended that you wear an event monitor. Event monitors are medical devices that record the heart's electrical activity. Doctors most often [place] these monitors to diagnose arrhythmias. Arrhythmias are problems with the speed or rhythm of the heartbeat. The monitor is a small, portable device. You can wear one while you do your normal daily activities. This is usually used to diagnose what is causing palpitations/syncope (passing out). Please see the instructions below located under "other instructions".   Your physician has recommended that you have a Cardioversion (DCCV). Electrical Cardioversion uses a jolt of electricity to your heart either through paddles or wired patches attached to your chest. This is a controlled, usually prescheduled, procedure. Defibrillation is done under light anesthesia in the hospital, and you usually go home the day of the procedure. This is done to get your heart back into a normal rhythm. You are not awake for the procedure. Please see the instructions below located under "other instructions".    Follow-Up: At [facility], you and your health needs are our priority.  As part of our continuing mission to provide you with exceptional heart care, we have created designated  Provider Care Teams.  These Care Teams include your primary Cardiologist (physician) and Advanced Practice Providers (APPs -  Physician Assistants and Nurse Practitioners) who all work together to provide you with the care you need, when you need it.  Your next appointment:   1 month(s) after your cardioversion on   The format for your next appointment:   In Person  Provider:   You will see one of the following Advanced Practice Providers on your designated Care Team:    Gypsy Balsam, NP  Francis Dowse, PA-C  Casimiro Needle "[place]" [place], [place]  Then, Will Jorja Loa, MD will plan to see you again in 6 month(s).    Thank you for choosing [facility]!!   Dory Horn, RN [phone]   Other Instructions   You are scheduled for a Cardioversionon 01/08/2020 with Dr. Tenny Craw.  Please arrive at the [facility] (Main Entrance A) at [facility]: [address] [place], [place] [address] at 9:00 am DIET: Nothing to eat or drink after midnight except a sip of water with medications (see medication instructions below)  Medication Instructions: Hold Torsemide & Flomax  Continue your anticoagulant: Eliquis You will need to continue your anticoagulant after your procedure until you are told by your p rovider that it is safe to stop   Labs: Today  You must have a responsible person to drive you home and stay in the waiting area during your procedure. Failure to do so could result in cancellation.  Bring your insurance cards.  *Special Note: Every effort is made to have your procedure done on time. Occasionally there are emergencies  that occur at the hospital that may cause delays. Please be patient if a delay does occur.       [employer] Cardiac Event Monitor Instructions Your physician has requested you wear your cardiac event monitor for 30 days. [employer] may call or text to confirm a shipping address. The monitor will be sent to a land address via UPS. [employer]  will not ship a monitor to a PO BOX. It typically takes 3-5 days to receive your monitor after it has been enrolled. [employer] will assist with USPS tracking if your package is delayed. The telephone number for [employer] is [phone]. Once you have received your monitor, please review the enclosed instructions. Instruction tutorials can also be viewed under help and settings on the enclosed cell phone. Your monitor has already been registered assigning a specific monitor serial # to you.  Applying the monitor Remove cell phone from case and turn it on. The cell phone works as IT consultant and needs to be within [employer] of you at all times. The cell phone will need to be charged on a daily basis. We recommend you plug the cell phone into the enclosed charger at your bedside table every night.  Monitor batteries: You will receive two monitor batteries labelled #1 and #2. These are your recorders. Plug battery #2 onto the second connection on the enclosed charger. Keep one battery on the charger at all times. This will keep the monitor battery deactivated. It will also keep it fully charged for when you need to switch your monitor batteries. A small light will be blinking on the battery emblem when it is charging. The light on the battery emblem will remain on when the battery is fully charged.  Open package of a Monitor strip. Insert battery #1 into black hood on strip and gently squeeze monitor battery onto connection as indicated in instruction booklet. Set aside while preparing skin.  Choose location for your strip, vertical or horizontal, as indicated in the instruction booklet. Shave to remove all hair from location. There cannot be any lotions, oils, powders, or colognes on skin where monitor is to be applied. Wipe skin clean with enclosed Saline wipe. Dry skin completely.  Peel paper labeled #1 off the back of the Monitor strip exposing the adhesive. Place the monitor on the  chest in the vertical or horizontal position shown in the instruction booklet. One arrow on the monitor strip must be pointing upward. Carefully remove paper labeled #2, attaching remainder of strip to your skin. Try not to create any folds or wrinkles in the strip as you apply it.  Firmly press and release the circle in the center of the monitor battery. You will hear a small beep. This is turning the monitor battery on. The heart emblem on the monitor battery will light up every 5 seconds if the monitor battery in turned on and connected to the patient securely. Do not push and hold the circle down as this turns the monitor battery off. The cell phone will locate the monitor battery. A screen will appear on the cell phone checking the connection of your monitor strip. This may read poor connection initially but change to good connection within the next minute. Once your monitor accepts the connection you will hear a series of 3 beeps followed by a climbing crescendo of beeps. A screen will appear on the cell phone showing the two monitor strip placement options. Touch the picture that demonstrates where you applied the monitor strip.  Your monitor strip and battery are waterproof. You are able to shower, bathe, or swim with the monitor on. They just ask you do not submerge deeper than 3 feet underwater. We recommend removing the monitor if you are swimming in a lake, river, or ocean.  Your monitor battery will need to be switched to a fully charged monitor battery approximately once a week. The cell phone will alert you of an action which needs to be made.  On the cell phone, tap for details to reveal connection status, monitor battery status, and cell phone battery status. The green dots indicates your monitor is in good status. A red dot indicates there is something that needs your attention.  To record a symptom, click the circle on the monitor battery. In 30-60 seconds a list of  symptoms will appear on the cell phone. Select your symptom and tap save. Your monitor will record a sustained or significant arrhythmia regardless of you clicking the button. Some patients do not feel the heart rhythm irregularities. [employer] will notify us of any serious or critical events.  Refer to instruction booklet for instructions on switching batteries, changing strips, the Do not disturb or Pause features, or any additional questions.  Call [employer] at [phone], to confirm your monitor is transmitting and record your baseline. They will answer any questions you may have regarding the monitor instructions at that time.  Returning the monitor to [employer] Place all equipment back into blue box. Peel off strip of paper to expose adhesive and close box securely. There is a prepaid UPS shipping label on this box. Drop in a UPS drop box, or at a UPS facility like [employer]. You may also contact [employer] to arrange UPS to pick up monitor package at your home.

## 2019-12-26 ENCOUNTER — Encounter: Payer: Self-pay | Admitting: *Deleted

## 2019-12-26 ENCOUNTER — Other Ambulatory Visit: Payer: Self-pay | Admitting: *Deleted

## 2019-12-26 NOTE — Progress Notes (Signed)
Patient ID: Ronnie Torres, male   DOB: 1929/06/20, 86 y.o.   MRN: [id number] Patient enrolled for [employer] to ship a 30 day cardiac event monitor to his home.  Instructions for [employer] cardiac event monitor provided to patient at office visit.

## 2020-01-05 NOTE — Progress Notes (Signed)
Pre op call done for endo procedure Monday 01/08/20. Phone call done with daughter due to patient being hard of hearing. Patient getting covid test tomorrow- reminded her the pt has to stay quarantined until Monday, she confirmed she he has not missed any doses of her blood thinner, and has someone driving him home post procedure. All questions addressed.

## 2020-01-06 ENCOUNTER — Other Ambulatory Visit (HOSPITAL_COMMUNITY)
Admission: RE | Admit: 2020-01-06 | Discharge: 2020-01-06 | Disposition: A | Discharge status: Home / Self Care | Payer: Medicare Other | Source: Ambulatory Visit | Attending: Internal Medicine | Admitting: Internal Medicine

## 2020-01-06 DIAGNOSIS — Z20822 Contact with and (suspected) exposure to covid-19: Secondary | ICD-10-CM | POA: Diagnosis not present

## 2020-01-06 DIAGNOSIS — Z01812 Encounter for preprocedural laboratory examination: Secondary | ICD-10-CM | POA: Insufficient documentation

## 2020-01-07 LAB — SARS CORONAVIRUS 2 (TAT 6-24 HRS): SARS Coronavirus 2: NEGATIVE

## 2020-01-08 ENCOUNTER — Encounter (HOSPITAL_COMMUNITY)
Admission: RE | Disposition: A | Discharge status: Home / Self Care | Payer: Self-pay | Source: Ambulatory Visit | Attending: Internal Medicine

## 2020-01-08 ENCOUNTER — Encounter (HOSPITAL_COMMUNITY): Payer: Self-pay | Admitting: Anesthesiology

## 2020-01-08 ENCOUNTER — Ambulatory Visit (HOSPITAL_COMMUNITY)
Admission: RE | Admit: 2020-01-08 | Discharge: 2020-01-08 | Disposition: A | Discharge status: Home / Self Care | Payer: Medicare Other | Source: Ambulatory Visit | Attending: Internal Medicine | Admitting: Internal Medicine

## 2020-01-08 DIAGNOSIS — I4891 Unspecified atrial fibrillation: Secondary | ICD-10-CM | POA: Insufficient documentation

## 2020-01-08 DIAGNOSIS — Z538 Procedure and treatment not carried out for other reasons: Secondary | ICD-10-CM | POA: Diagnosis not present

## 2020-01-08 LAB — CBC
HCT: 32.8 % — ABNORMAL LOW (ref 39.0–52.0)
Hemoglobin: 10.7 g/dL — ABNORMAL LOW (ref 13.0–17.0)
MCH: 30.1 pg (ref 26.0–34.0)
MCHC: 32.6 g/dL (ref 30.0–36.0)
MCV: 92.4 fL (ref 80.0–100.0)
Platelets: 148 10*3/uL — ABNORMAL LOW (ref 150–400)
RBC: 3.55 MIL/uL — ABNORMAL LOW (ref 4.22–5.81)
RDW: 14.6 % (ref 11.5–15.5)
WBC: 5.6 10*3/uL (ref 4.0–10.5)
nRBC: 0 % (ref 0.0–0.2)

## 2020-01-08 SURGERY — CANCELLED PROCEDURE

## 2020-01-08 NOTE — H&P (Signed)
Patient presented today for cardioversion Found to be in sinus rhythm  EKG  Sinus bradycardia  56 bpm  First degree AV block  PR interval 304 msec.  Cardioversion cancelled   Will notify Dr Elberta Fortis Patient says overall is about the same   He can't see a marked change in the past few weeks  Will check a CBC today  Pt had a bleeding episode from foot (cut)  York Spaniel he lost a lot of blood  Dietrich Pates MD

## 2020-01-08 NOTE — Progress Notes (Signed)
Patient in NSR, procedure cancelled per MD Ross.

## 2020-01-12 MED ORDER — POTASSIUM CHLORIDE ER 20 MEQ PO TBCR: 20.0000 meq | EXTENDED_RELEASE_TABLET | Freq: Three times a day (TID) | ORAL | 3 refills | Status: DC

## 2020-01-15 ENCOUNTER — Encounter: Payer: Self-pay | Admitting: Cardiology

## 2020-01-15 ENCOUNTER — Encounter (INDEPENDENT_AMBULATORY_CARE_PROVIDER_SITE_OTHER): Payer: Medicare Other

## 2020-01-15 DIAGNOSIS — R55 Syncope and collapse: Secondary | ICD-10-CM | POA: Diagnosis not present

## 2020-01-22 ENCOUNTER — Telehealth: Payer: Self-pay | Admitting: *Deleted

## 2020-01-22 NOTE — Telephone Encounter (Signed)
[employer] report received for 01/21/20 Auto Trigger. 08:03 am Atrial Fibrillation RVR sustained with couplet PVC's/PVCs (14 in 1 min) Heart rate 130.  He was getting ready for church.  Patient could feel his heart going fast but no other symptoms.  Will take to DOD Dr. Mayford Knife for review. Dr. Mayford Knife reviewed strips; PVC's, PAF with run. afib clinic this week for follow up.    Appointment made for 01/29/20 at 3 pm at the afib clinic. Daughter aware for transportation.

## 2020-01-29 ENCOUNTER — Ambulatory Visit (HOSPITAL_COMMUNITY): Payer: Medicare Other | Admitting: Physician Assistant

## 2020-02-02 ENCOUNTER — Encounter (HOSPITAL_COMMUNITY): Payer: Self-pay | Admitting: Physician Assistant

## 2020-02-02 ENCOUNTER — Other Ambulatory Visit: Payer: Self-pay

## 2020-02-02 ENCOUNTER — Ambulatory Visit (HOSPITAL_COMMUNITY)
Admission: RE | Admit: 2020-02-02 | Discharge: 2020-02-02 | Disposition: A | Discharge status: Home / Self Care | Payer: Medicare Other | Source: Ambulatory Visit | Attending: Physician Assistant | Admitting: Physician Assistant

## 2020-02-02 VITALS — BP 114/62 | HR 119 | Ht 63.5 in | Wt 187.2 lb

## 2020-02-02 DIAGNOSIS — G4733 Obstructive sleep apnea (adult) (pediatric): Secondary | ICD-10-CM | POA: Insufficient documentation

## 2020-02-02 DIAGNOSIS — I4819 Other persistent atrial fibrillation: Secondary | ICD-10-CM | POA: Insufficient documentation

## 2020-02-02 DIAGNOSIS — Z7951 Long term (current) use of inhaled steroids: Secondary | ICD-10-CM | POA: Diagnosis not present

## 2020-02-02 DIAGNOSIS — D6869 Other thrombophilia: Secondary | ICD-10-CM | POA: Insufficient documentation

## 2020-02-02 DIAGNOSIS — Z87891 Personal history of nicotine dependence: Secondary | ICD-10-CM | POA: Diagnosis not present

## 2020-02-02 DIAGNOSIS — Z7901 Long term (current) use of anticoagulants: Secondary | ICD-10-CM | POA: Diagnosis not present

## 2020-02-02 DIAGNOSIS — I2510 Atherosclerotic heart disease of native coronary artery without angina pectoris: Secondary | ICD-10-CM | POA: Insufficient documentation

## 2020-02-02 DIAGNOSIS — I10 Essential (primary) hypertension: Secondary | ICD-10-CM | POA: Insufficient documentation

## 2020-02-02 DIAGNOSIS — E669 Obesity, unspecified: Secondary | ICD-10-CM | POA: Diagnosis not present

## 2020-02-02 DIAGNOSIS — J439 Emphysema, unspecified: Secondary | ICD-10-CM | POA: Insufficient documentation

## 2020-02-02 DIAGNOSIS — Z79899 Other long term (current) drug therapy: Secondary | ICD-10-CM | POA: Insufficient documentation

## 2020-02-02 DIAGNOSIS — Z6832 Body mass index (BMI) 32.0-32.9, adult: Secondary | ICD-10-CM | POA: Insufficient documentation

## 2020-02-02 NOTE — Progress Notes (Signed)
Primary Care Physician: Jim Like, MD Primary Electrophysiologist: Dr Elberta Fortis Referring Physician: Dr Mayford Knife   Ronnie Torres is a 87 y.o. male with a history of atrial fibrillation, HTN, GERD, COPD, CAD, OSA who presents for follow up in the [facility]. He was initially on amiodarone, but did not stay in normal rhythm.  He has been switched to dofetilide.  He developed chest pain previously and had a left heart catheterization which showed a chronically occluded LAD and no intervention was performed. Patient is on Eliquis for a CHADS2VASC score of 4. He was seen by Dr Elberta Fortis 12/25/19 and was in afib at that visit. He was set up for DCCV 12/13 but presented in SR. An event monitor was also placed due to a syncopal event. [employer] sent an alert for an episode of afib with RVR. Patient reports that he feels reasonably well today and is unaware of his arrhythmia. He states that on the day he presented for DCCV in SR he felt no different. His daughter who accompanies him today reports that he has not had any further syncope.   Today, he denies symptoms of palpitations, chest pain, shortness of breath, orthopnea, PND, lower extremity edema, dizziness, presyncope, syncope, snoring, daytime somnolence, bleeding, or neurologic sequela. The patient is tolerating medications without difficulties and is otherwise without complaint today.    Atrial Fibrillation Risk Factors:  he does have symptoms or diagnosis of sleep apnea. he is compliant with CPAP therapy. he does not have a history of rheumatic fever.   he has a BMI of Body mass index is 32.64 kg/m.Marland Kitchen Filed Weights   02/02/20 1346  Weight: 84.9 kg    Family History  Problem Relation Age of Onset  . Other Mother        died in childbirth  . CVA Father   . Dementia Sister   . Pancreatic cancer Brother   . Stomach cancer Brother   . Atrial fibrillation Brother   . Dementia Sister   . Breast cancer  Daughter   . Kidney cancer Daughter      Atrial Fibrillation Management history:  Previous antiarrhythmic drugs: amiodarone, dofetilide Previous cardioversions: 11/2017 Previous ablations: none CHADS2VASC score: 4 Anticoagulation history: Eliquis   Past Medical History:  Diagnosis Date  . Acid reflux   . Anemia   . Arthritis   . Atrial fibrillation (HCC)   . BPH (benign prostatic hyperplasia)   . CAD (coronary artery disease)   . Colon polyps   . COPD (chronic obstructive pulmonary disease) (HCC)   . COVID-19   . Diverticulitis   . Diverticulosis   . Emphysema lung (HCC)   . GERD (gastroesophageal reflux disease)   . Gout   . Hemorrhoids   . Hiatal hernia   . Sleep apnea    CPAP  . Status post dilation of esophageal narrowing    Past Surgical History:  Procedure Laterality Date  . CARDIOVERSION N/A 12/01/2017   Procedure: CARDIOVERSION;  Surgeon: Jake Bathe, MD;  Location: [facility] ENDOSCOPY;  Service: Cardiovascular;  Laterality: N/A;  . CORONARY ANGIOGRAPHY N/A 11/02/2017   Procedure: CORONARY ANGIOGRAPHY (CATH LAB);  Surgeon: Tonny Bollman, MD;  [facility] INVASIVE CV LAB;  Service: Cardiovascular;  Laterality: N/A;  . HEMORROIDECTOMY    . KIDNEY STONE SURGERY     Basket    Current Outpatient Medications  Medication Sig Dispense Refill  . acetaminophen (TYLENOL) 500 MG tablet Take 1,000 mg by mouth every 6 (six)  hours as needed for moderate pain.    Marland Kitchen albuterol (PROVENTIL HFA;VENTOLIN HFA) 108 (90 Base) MCG/ACT inhaler Inhale 2 puffs into the lungs every 4 (four) hours as needed for wheezing or shortness of breath.     Marland Kitchen apixaban (ELIQUIS) 5 MG TABS tablet Take 1 tablet (5 mg total) by mouth 2 (two) times daily. 180 tablet 2  . aspirin 81 MG EC tablet Take 81 mg by mouth every Monday, Wednesday, and Friday.    . budesonide-formoterol (SYMBICORT) 80-4.5 MCG/ACT inhaler     . colchicine 0.6 MG tablet Take by mouth.    . dofetilide (TIKOSYN) 250 MCG capsule Take  1 capsule (250 mcg total) by mouth 2 (two) times daily. 180 capsule 2  . ferrous sulfate 324 (65 Fe) MG TBEC Take 648 mg by mouth daily.    Marland Kitchen gabapentin (NEURONTIN) 100 MG capsule Take 100 mg by mouth 2 (two) times daily.     . isosorbide mononitrate (IMDUR) 30 MG 24 hr tablet Take 0.5 tablets (15 mg total) by mouth daily. 45 tablet 3  . Menthol, Topical Analgesic, 8 % LIQD Apply 1 application topically 2 (two) times daily as needed (joint pain).    Marland Kitchen metolazone (ZAROXOLYN) 2.5 MG tablet Take 1 tablet (2.5 mg total) by mouth every Saturday. 30 tablet 3  . metoprolol succinate (TOPROL-XL) 50 MG 24 hr tablet Take 1 tablet (50 mg total) by mouth daily. Take with or immediately following a meal. 30 tablet 6  . omeprazole (PRILOSEC) 20 MG capsule Take 20 mg by mouth 2 (two) times daily.     . polyethylene glycol (MIRALAX / GLYCOLAX) 17 g packet Take 17 g by mouth daily as needed for moderate constipation.    . Potassium Chloride ER 20 MEQ TBCR Take 20 mEq by mouth 3 (three) times daily. 90 tablet 3  . senna-docusate (SENOKOT-S) 8.6-50 MG tablet Take 1 tablet by mouth at bedtime as needed for mild constipation.    . sertraline (ZOLOFT) 25 MG tablet Take 25 mg by mouth at bedtime.     . tamsulosin (FLOMAX) 0.4 MG CAPS capsule Take 0.4 mg by mouth at bedtime.    . torsemide (DEMADEX) 20 MG tablet Take 2 tablets (40 mg total) by mouth daily. 180 tablet 3   No current facility-administered medications for this encounter.    Allergies  Allergen Reactions  . Shellfish Allergy Hives  . Tape Other (See Comments)    "takes skin off"  . Sulfa Antibiotics Rash  . Trazodone And Nefazodone Rash    Social History   Socioeconomic History  . Marital status: Married    Spouse name: Not on file  . Number of children: 4  . Years of education: Not on file  . Highest education level: Not on file  Occupational History  . Occupation: retired  Tobacco Use  . Smoking status: Former Smoker    Quit date: 1975     Years since quitting: 47.0  . Smokeless tobacco: Former Systems developer    Types: Secondary school teacher  . Vaping Use: Never used  Substance and Sexual Activity  . Alcohol use: Never  . Drug use: Never  . Sexual activity: Not on file  Other Topics Concern  . Not on file  Social History Narrative  . Not on file   Social Determinants of Health   Financial Resource Strain: Not on file  Food Insecurity: Not on file  Transportation Needs: Not on file  Physical Activity: Not on  file  Stress: Not on file  Social Connections: Not on file  Intimate Partner Violence: Not on file     ROS- All systems are reviewed and negative except as per the HPI above.  Physical Exam: Vitals:   02/02/20 1346  BP: 114/62  Pulse: (!) 119  Weight: 84.9 kg  Height: 5' 3.5" (1.613 m)    GEN- The patient is well appearing elderly obese male, alert and oriented x 3 today.   Head- normocephalic, atraumatic Eyes-  Sclera clear, conjunctiva pink Ears- hearing intact Oropharynx- clear Neck- supple  Lungs- Clear to ausculation bilaterally, normal work of breathing Heart- irregular rate and rhythm, no murmurs, rubs or gallops  GI- soft, NT, ND, + BS Extremities- no clubbing, cyanosis. 1+ bilateral edema MS- no significant deformity or atrophy Skin- no rash or lesion Psych- euthymic mood, full affect Neuro- strength and sensation are intact  Wt Readings from Last 3 Encounters:  02/02/20 84.9 kg  12/25/19 85.7 kg  12/06/19 84.8 kg    EKG today demonstrates  Afib, LAFB, PVC Vent. rate 119 BPM QRS duration 76 ms QT/QTc 290/407 ms  Echo 01/23/19 demonstrated  1. Left ventricular ejection fraction, by visual estimation, is 65 to  70%. The left ventricle has hyperdynamic function. There is no left  ventricular hypertrophy.  2. Left ventricular diastolic function could not be evaluated.  3. The left ventricle has no regional wall motion abnormalities.  4. Global right ventricle has normal systolic  function.The right  ventricular size is normal. No increase in right ventricular wall  thickness.  5. Left atrial size was normal.  6. Right atrial size was mildly dilated.  7. The mitral valve is normal in structure. Trivial mitral valve  regurgitation. No evidence of mitral stenosis.  8. The tricuspid valve is normal in structure.  9. The aortic valve is normal in structure. Aortic valve regurgitation is  not visualized. Mild to moderate aortic valve sclerosis/calcification  without any evidence of aortic stenosis.  10. The pulmonic valve was normal in structure. Pulmonic valve  regurgitation is not visualized.  11. Moderately elevated pulmonary artery systolic pressure.  12. The tricuspid regurgitant velocity is 2.91 m/s, and with an assumed  right atrial pressure of 8 mmHg, the estimated right ventricular systolic  pressure is moderately elevated at 42.0 mmHg.  13. The inferior vena cava is normal in size with greater than 50%  respiratory variability, suggesting right atrial pressure of 3 mmHg.  14. Since the last study on 10/01/2017 LVEF has improved from 45-50% to  65-70%.  Epic records are reviewed at length today  CHA2DS2-VASc Score = 4  The patient's score is based upon: CHF History: No HTN History: Yes Diabetes History: No Stroke History: No Vascular Disease History: Yes Age Score: 2 Gender Score: 0      ASSESSMENT AND PLAN: 1. Persistent Atrial Fibrillation (ICD10:  I48.19) The patient's CHA2DS2-VASc score is 4, indicating a 4.8% annual risk of stroke.   Patient in afib with elevated heart rates, better on recheck after sitting in exam room. Will not schedule DCCV as he appears paroxysmal. We had a long discussion about therapeutic options today. Offered to increase Toprol an additional 12.5 mg daily. Difficult with h/o syncope and hypotension. Will wait and see result of monitor before making changes as patient feels well overall. Patient and family in agreement  with this plan.  Continue dofetilide 250 mcg BID for now. Recall he previously failed amiodarone.  Continue Eliquis 5 mg BID  Toprol 50 mg daily  2. Secondary Hypercoagulable State (ICD10:  D68.69) The patient is at significant risk for stroke/thromboembolism based upon his CHA2DS2-VASc Score of 4.  Continue Apixaban (Eliquis).   3. Obesity Body mass index is 32.64 kg/m. Lifestyle modification was discussed at length including regular exercise and weight reduction.  4. Obstructive sleep apnea The importance of adequate treatment of sleep apnea was discussed today in order to improve our ability to maintain sinus rhythm long term. Encouraged compliance with CPAP therapy.  5. CAD No anginal symptoms.  6. HTN Stable, no changes today.   Follow up with Dr Elberta Fortis as scheduled.    Jorja Loa PA-C Af[facility] [address] [place], [place] [address] [phone] 02/02/2020 1:57 PM

## 2020-02-26 ENCOUNTER — Other Ambulatory Visit: Payer: Self-pay

## 2020-02-26 ENCOUNTER — Ambulatory Visit (INDEPENDENT_AMBULATORY_CARE_PROVIDER_SITE_OTHER): Payer: Medicare Other | Admitting: Cardiology

## 2020-02-26 ENCOUNTER — Encounter: Payer: Self-pay | Admitting: Cardiology

## 2020-02-26 VITALS — BP 110/80 | HR 81 | Ht 63.25 in | Wt 187.0 lb

## 2020-02-26 DIAGNOSIS — I4819 Other persistent atrial fibrillation: Secondary | ICD-10-CM | POA: Diagnosis not present

## 2020-02-26 NOTE — Progress Notes (Signed)
Electrophysiology Office Note   Date:  02/26/2020   ID:  Ronnie Torres, DOB 1929-10-18, MRN [id number]  PCP:  Jim Like, MD  Cardiologist:   Primary Electrophysiologist:  Elbert Spickler Jorja Loa, MD    No chief complaint on file.    History of Present Illness: Ronnie Torres is a 87 y.o. male who is being seen today for the evaluation of atrial fibrillation at the request of Atha Starks. Presenting today for electrophysiology evaluation.    He has a history of persistent atrial fibrillation, COPD, BPH, coronary artery disease.  He was initially on amiodarone but did not stay in rhythm.  He is switched to dofetilide.  He developed chest pain and left heart catheterization showed a occluded LAD and no intervention was performed.  Today, denies symptoms of palpitations, chest pain, shortness of breath, orthopnea, PND, claudication, dizziness, presyncope, syncope, bleeding, or neurologic sequela. The patient is tolerating medications without difficulties.  He continues to have lower extremity swelling.  He was seen in clinic atrial fibrillation, but wore a cardiac monitor that showed a 2% burden.  His main complaint is fatigue and continued lower extremity edema.  Otherwise, he has no complaints.  He was previously quite short of breath, but has not been more compliant with his medications which is greatly improved his shortness of breath.  Past Medical History:  Diagnosis Date  . Acid reflux   . Anemia   . Arthritis   . Atrial fibrillation (HCC)   . BPH (benign prostatic hyperplasia)   . CAD (coronary artery disease)   . Colon polyps   . COPD (chronic obstructive pulmonary disease) (HCC)   . COVID-19   . Diverticulitis   . Diverticulosis   . Emphysema lung (HCC)   . GERD (gastroesophageal reflux disease)   . Gout   . Hemorrhoids   . Hiatal hernia   . Sleep apnea    CPAP  . Status post dilation of esophageal narrowing    Past Surgical History:  Procedure Laterality Date  .  CARDIOVERSION N/A 12/01/2017   Procedure: CARDIOVERSION;  Surgeon: Jake Bathe, MD;  Location: [facility] ENDOSCOPY;  Service: Cardiovascular;  Laterality: N/A;  . CORONARY ANGIOGRAPHY N/A 11/02/2017   Procedure: CORONARY ANGIOGRAPHY (CATH LAB);  Surgeon: Tonny Bollman, MD;  Location: [facility] INVASIVE CV LAB;  Service: Cardiovascular;  Laterality: N/A;  . HEMORROIDECTOMY    . KIDNEY STONE SURGERY     Basket     Current Outpatient Medications  Medication Sig Dispense Refill  . acetaminophen (TYLENOL) 500 MG tablet Take 1,000 mg by mouth every 6 (six) hours as needed for moderate pain.    Marland Kitchen albuterol (PROVENTIL HFA;VENTOLIN HFA) 108 (90 Base) MCG/ACT inhaler Inhale 2 puffs into the lungs every 4 (four) hours as needed for wheezing or shortness of breath.     Marland Kitchen apixaban (ELIQUIS) 5 MG TABS tablet Take 1 tablet (5 mg total) by mouth 2 (two) times daily. 180 tablet 2  . aspirin 81 MG EC tablet Take 81 mg by mouth every Monday, Wednesday, and Friday.    . budesonide-formoterol (SYMBICORT) 80-4.5 MCG/ACT inhaler     . colchicine 0.6 MG tablet Take by mouth.    . dofetilide (TIKOSYN) 250 MCG capsule Take 1 capsule (250 mcg total) by mouth 2 (two) times daily. 180 capsule 2  . ferrous sulfate 324 (65 Fe) MG TBEC Take 648 mg by mouth daily.    Marland Kitchen gabapentin (NEURONTIN) 100 MG capsule Take 100 mg by mouth 2 (two)  times daily.     . isosorbide mononitrate (IMDUR) 30 MG 24 hr tablet Take 0.5 tablets (15 mg total) by mouth daily. 45 tablet 3  . Menthol, Topical Analgesic, 8 % LIQD Apply 1 application topically 2 (two) times daily as needed (joint pain).    . metoprolol succinate (TOPROL-XL) 50 MG 24 hr tablet Take 1 tablet (50 mg total) by mouth daily. Take with or immediately following a meal. 30 tablet 6  . omeprazole (PRILOSEC) 20 MG capsule Take 20 mg by mouth 2 (two) times daily.     . polyethylene glycol (MIRALAX / GLYCOLAX) 17 g packet Take 17 g by mouth daily as needed for moderate constipation.    .  Potassium Chloride ER 20 MEQ TBCR Take 20 mEq by mouth 3 (three) times daily. 90 tablet 3  . senna-docusate (SENOKOT-S) 8.6-50 MG tablet Take 1 tablet by mouth at bedtime as needed for mild constipation.    . sertraline (ZOLOFT) 25 MG tablet Take 25 mg by mouth at bedtime.     . tamsulosin (FLOMAX) 0.4 MG CAPS capsule Take 0.4 mg by mouth at bedtime.    . metolazone (ZAROXOLYN) 2.5 MG tablet Take 1 tablet (2.5 mg total) by mouth every Saturday. 30 tablet 3  . torsemide (DEMADEX) 20 MG tablet Take 2 tablets (40 mg total) by mouth daily. 180 tablet 3   No current facility-administered medications for this visit.    Allergies:   Shellfish allergy, Tape, Sulfa antibiotics, and Trazodone and nefazodone   Social History:  The patient  reports that he quit smoking about 47 years ago. He has quit using smokeless tobacco.  His smokeless tobacco use included chew. He reports that he does not drink alcohol and does not use drugs.   Family History:  The patient's family history includes Atrial fibrillation in his brother; Breast cancer in his daughter; CVA in his father; Dementia in his sister and sister; Kidney cancer in his daughter; Other in his mother; Pancreatic cancer in his brother; Stomach cancer in his brother.   ROS:  Please see the history of present illness.   Otherwise, review of systems is positive for none.   All other systems are reviewed and negative.   PHYSICAL EXAM: VS:  BP 110/80   Pulse 81   Ht 5' 3.25" (1.607 m)   Wt 187 lb (84.8 kg)   SpO2 97%   BMI 32.86 kg/m  , BMI Body mass index is 32.86 kg/m. GEN: Well nourished, well developed, in no acute distress  HEENT: normal  Neck: no JVD, carotid bruits, or masses Cardiac: RRR; no murmurs, rubs, or gallops,no edema  Respiratory:  clear to auscultation bilaterally, normal work of breathing GI: soft, nontender, nondistended, + BS MS: no deformity or atrophy  Skin: warm and dry Neuro:  Strength and sensation are intact Psych:  euthymic mood, full affect  EKG:  EKG is ordered today. Personal review of the ekg ordered shows atrial fibrillation, rate 119  Recent Labs: 05/29/2019: Magnesium 2.3 12/25/2019: BUN 23; Creatinine, Ser 1.24; Potassium 3.7; Sodium 141 01/08/2020: Hemoglobin 10.7; Platelets 148    Lipid Panel  No results found for: CHOL, TRIG, HDL, CHOLHDL, VLDL, LDLCALC, LDLDIRECT   Wt Readings from Last 3 Encounters:  02/26/20 187 lb (84.8 kg)  02/02/20 187 lb 3.2 oz (84.9 kg)  12/25/19 189 lb (85.7 kg)      Other studies Reviewed: Additional studies/ records that were reviewed today include: Myoview 06/29/2017 Review of the above records  today demonstrates:  No inducible ischemia No wall motion abnormalities Normal left ventricular function  TTE 10/01/17 - Left ventricle: The cavity size was normal. There was mild   concentric hypertrophy. Systolic function was mildly reduced. The   estimated ejection fraction was in the range of 45% to 50%.   Diffuse hypokinesis. - Aortic valve: Transvalvular velocity was within the normal range.   There was no stenosis. There was no regurgitation. - Mitral valve: Moderately calcified annulus. Calcification.   Transvalvular velocity was within the normal range. There was no   evidence for stenosis. There was trivial regurgitation. - Left atrium: The atrium was moderately dilated. - Right ventricle: The cavity size was normal. Wall thickness was   normal. Systolic function was normal. - Tricuspid valve: There was mild regurgitation. - Pulmonary arteries: Systolic pressure was within the normal   range. PA peak pressure: 18 mm Hg (S).  LHC 11/02/17  Ost 2nd Mrg to 2nd Mrg lesion is 30% stenosed.  Prox LAD to Mid LAD lesion is 100% stenosed.   1.  Total occlusion of the LAD just after the first diagonal branch 2.  Minor nonobstructive disease involving the left main, left circumflex, and RCA  Cardiac monitor 02/20/2020 personally reviewed Predominant  rhythm was sinus rhythm.  Atrial fibrillation also noted, rapid response 89%.  PVCs and PACs noted.  PVC burden 4%.   ASSESSMENT AND PLAN:  1.  Persistent atrial fibrillation: Currently on Eliquis and dofetilide.  Monitoring for high risk medication.  CHA2DS2-VASc of 3.  He wore recent cardiac monitor that showed 2% atrial fibrillation.  We Ronnie Torres continue with current management.   2.  Coronary artery disease with chronic stable angina: Has a chronically occluded LAD.  Continue Imdur.  3.  Lower extremity edema: Has significant edema today.  He is on torsemide with as needed metolazone.  I have told his daughter that he can take an extra dose of torsemide for lower extremity swelling.  4.  Syncope: Likely due to a vagal episode.  Wore a 30-day monitor without further episodes or causes of syncope.  No changes.  Current medicines are reviewed at length with the patient today.   The patient does not have concerns regarding his medicines.  The following changes were made today: None  Labs/ tests ordered today include:  No orders of the defined types were placed in this encounter.    Disposition:   FU with Ronnie Torres 6 months  Signed, Wrigley Plasencia Jorja Loa, MD  02/26/2020 3:07 PM     [facility] [address] [place] [address] [phone] (office) [phone] (fax)

## 2020-02-26 NOTE — Patient Instructions (Signed)
Medication Instructions:  °Your physician recommends that you continue on your current medications as directed. Please refer to the Current Medication list given to you today. ° °*If you need a refill on your cardiac medications before your next appointment, please call your pharmacy* ° ° °Lab Work: °None ordered ° ° °Testing/Procedures: °None ordered ° ° °Follow-Up: °At [facility], you and your health needs are our priority.  As part of our continuing mission to provide you with exceptional heart care, we have created designated Provider Care Teams.  These Care Teams include your primary Cardiologist (physician) and Advanced Practice Providers (APPs -  Physician Assistants and Nurse Practitioners) who all work together to provide you with the care you need, when you need it. ° °Your next appointment:   °6 month(s) ° °The format for your next appointment:   °In Person ° °Provider:   °Will [name], MD ° ° ° °Thank you for choosing [facility]!! ° ° °Cherese Lozano, RN °[phone] °  °

## 2020-03-02 ENCOUNTER — Other Ambulatory Visit: Payer: Self-pay | Admitting: Cardiology

## 2020-03-09 ENCOUNTER — Other Ambulatory Visit: Payer: Self-pay | Admitting: Cardiology

## 2020-03-23 ENCOUNTER — Other Ambulatory Visit: Payer: Self-pay | Admitting: Cardiology

## 2020-03-26 MED ORDER — DOFETILIDE 250 MCG PO CAPS
250.0000 ug | ORAL_CAPSULE | Freq: Two times a day (BID) | ORAL | 1 refills | Status: DC
Start: 2020-03-26 — End: 2020-03-28

## 2020-03-28 ENCOUNTER — Telehealth: Payer: Self-pay

## 2020-03-28 MED ORDER — DOFETILIDE 250 MCG PO CAPS
250.0000 ug | ORAL_CAPSULE | Freq: Two times a day (BID) | ORAL | 2 refills | Status: DC
Start: 2020-03-28 — End: 2020-03-28

## 2020-03-28 MED ORDER — DOFETILIDE 250 MCG PO CAPS: 250.0000 ug | ORAL_CAPSULE | Freq: Two times a day (BID) | ORAL | 3 refills | Status: DC

## 2020-03-28 NOTE — Telephone Encounter (Signed)
**Note De-Identified Brownie Gockel Obfuscation** A [employer] pt asst application for Tikosyn was left at the office with documents. I completed the MD page of the application and emailed all to Dr [employer] nurse so she can print a [employer], have Dr Elberta Fortis sign it and to fax all to [employer] at the fax number written on the cover letter included.

## 2020-03-28 NOTE — Telephone Encounter (Signed)
asst application faxed, along with signed Rx. Confirmation received.

## 2020-04-11 NOTE — Telephone Encounter (Signed)
Called pt and spoke with pt's daughter to inform them that pt's medication Tikosyn was at the office for pt to pick up from [employer] patient assistance program. Daughter verbalized understanding. 3 bottles of 250 mcg, 60 tablets per bottles.  Lot# [id number]  Exp: 12/2020.

## 2020-04-11 NOTE — Telephone Encounter (Signed)
Letter received from [employer] stating that they have approved the pt for asst with his Tikosyn until 01/25/2021. XB#:[id number]  The letter states that they have notified the pt of this approval as well.

## 2020-06-10 ENCOUNTER — Other Ambulatory Visit: Payer: Self-pay

## 2020-06-10 MED ORDER — METOPROLOL SUCCINATE ER 50 MG PO TB24: 50.0000 mg | ORAL_TABLET | Freq: Every day | ORAL | 2 refills | Status: DC

## 2020-06-13 ENCOUNTER — Other Ambulatory Visit: Payer: Self-pay | Admitting: Cardiology

## 2020-06-13 NOTE — Telephone Encounter (Signed)
Epimenio Foot, LPN, can you please assist with this matter? Thanks

## 2020-06-13 NOTE — Telephone Encounter (Signed)
*  STAT* If patient is at the pharmacy, call can be transferred to refill team.   1. Which medications need to be refilled? (please list name of each medication and dose if known) Tikosyn  2. Which pharmacy/location (including street and city if local pharmacy) is medication to be sent to? [facility] 3. Do they need a 30 day or 90 day supply? Patient daughter will pick them 87

## 2020-06-14 ENCOUNTER — Telehealth: Payer: Self-pay

## 2020-06-14 NOTE — Telephone Encounter (Signed)
**Note De-Identified Ronnie Torres Obfuscation** Per the pts daughter's request I called [employer] pt asst [employer] at [phone] and ordered the pts next 90 day shipment of Tikosyn with [employer]. Pt ID: [id number] Per Karen Kitchens this order # is [phone] and the pt has 2 more shipments for this year after this one and the next re-order is due on Aug.1, 2022.  Karen Kitchens states that the pts Tikosyn will arrive at Dr [employer] office in 7 to 10 business days from 06/19/2020.  I have made the pts daughter aware of this and she is aware that we will contact them when it arrives.

## 2020-06-16 ENCOUNTER — Other Ambulatory Visit: Payer: Self-pay | Admitting: Cardiology

## 2020-06-17 MED ORDER — ELIQUIS 5 MG PO TABS: 1.0000 | ORAL_TABLET | Freq: Two times a day (BID) | ORAL | 1 refills | Status: DC

## 2020-06-17 NOTE — Telephone Encounter (Signed)
Prescription refill request for Eliquis received. Indication: Afib Last office visit: [name], 02/26/2020 Scr: 1.24, 12/25/2019 Age: 87 yo  Weight: 84.8 kg   Pt is on the correct dose of Eliquis per dosing criteria, prescription refill sent for Eliquis 5mg  BID.

## 2020-06-17 NOTE — Telephone Encounter (Signed)
3 month rx requested, has been sent in

## 2020-06-17 NOTE — Addendum Note (Signed)
Addended by: Lasheika Ortloff E on: 06/17/2020 09:08 AM   Modules accepted: Orders

## 2020-07-03 NOTE — Telephone Encounter (Signed)
Called pt and left message informing them that pt's medication from [employer] patient assistance program was at the [facility] office for him to pick up. Tikosyn 250 mcg, 3 bottles of 60 capsules, Exp: 12/2021  Lot: [id number].

## 2020-08-19 IMAGING — CT CT HEART MORP W/ CTA COR W/ SCORE W/ CA W/CM &/OR W/O CM
4 of 7 series · 8 of 20 positions shown, 9 images · IV contrast (APPLIED)
Comparison: None.

CLINICAL DATA: Chest pain

EXAM:
Cardiac CTA
MEDICATIONS:
Sub lingual nitro.  4 mg x 2 and lopressor 5mg
TECHNIQUE: The patient was scanned on a Siemens [REDACTED]ice scanner. Gantry
rotation speed was 250 msecs. Collimation was 0.6 mm. A 100 kV
prospective scan was triggered in the ascending thoracic aorta at
35-75% of the R-R interval. Average HR during the scan was 60 bpm.
The 3D data set was interpreted on a dedicated work station using
MPR, MIP and VRT modes. A total of 80cc of contrast was used.

[Series 6: best diast 79 % · axial · 0.40mm/px · z∈[+1174,+1215]mm · 2 of 306 slices shown, 3 images]
[im 102/306  vessel]
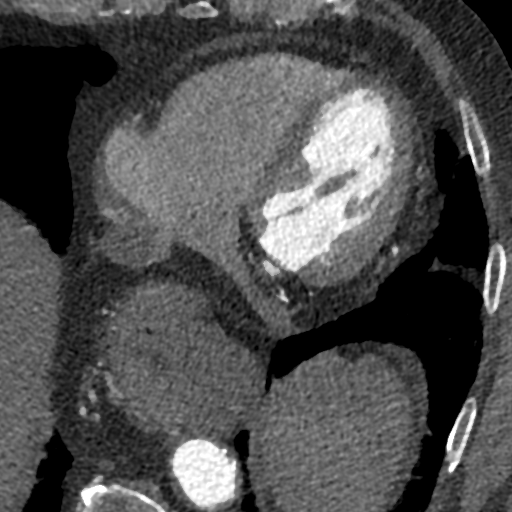
[im 102/306  lung]
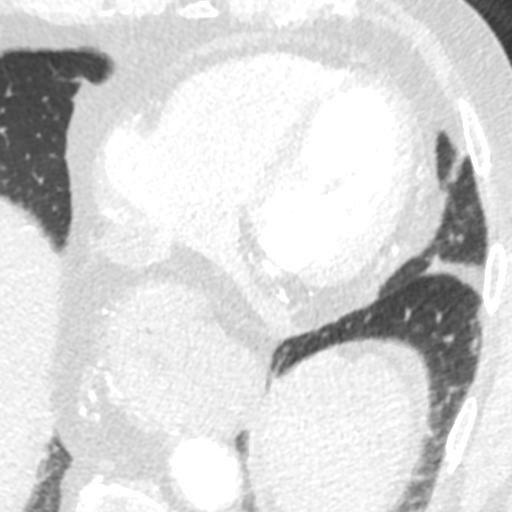
[im 204/306  vessel]
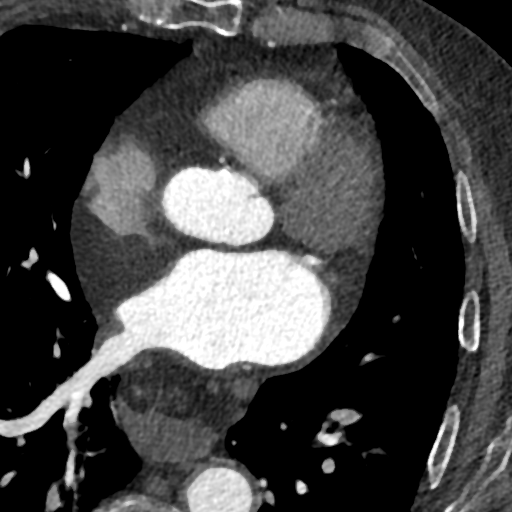

[Series 7: best syst 33 % · axial · 0.40mm/px · z∈[+1174,+1215]mm · 2 of 306 slices shown]
[im 102/306  vessel]
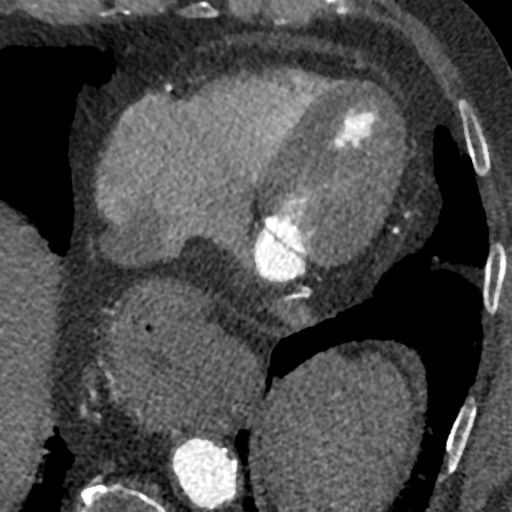
[im 204/306  vessel]
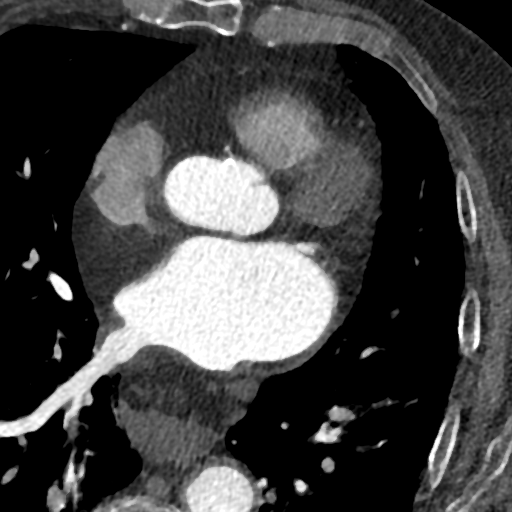

[Series 8: ts diast sharp 79 % · axial · 0.40mm/px · z∈[+1174,+1215]mm · 2 of 306 slices shown]
[im 102/306  lung]
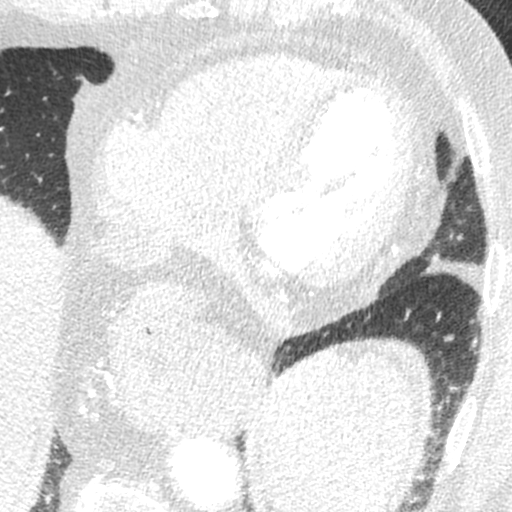
[im 204/306  lung]
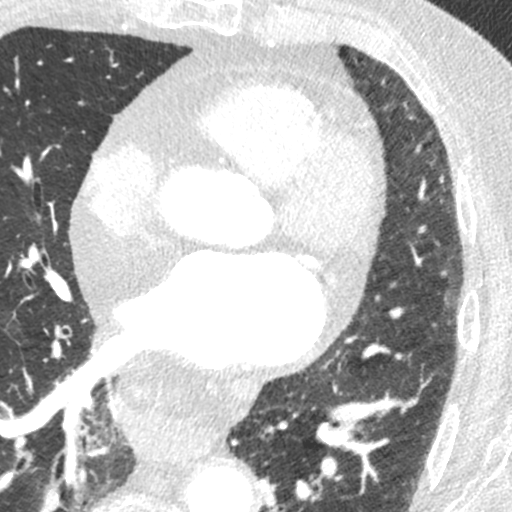

[Series 9: ts syst sharp 33 % · axial · 0.40mm/px · z∈[+1174,+1215]mm · 2 of 306 slices shown]
[im 102/306  lung]
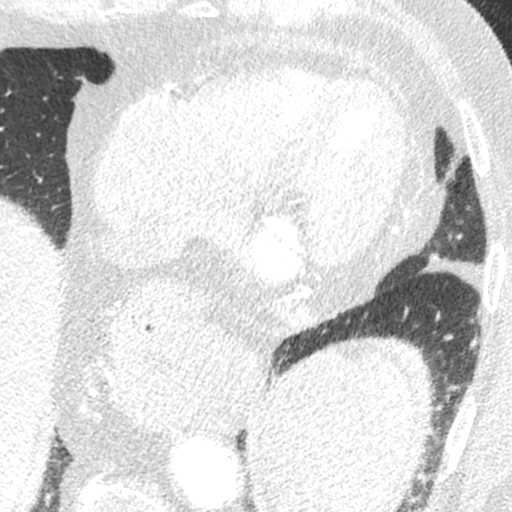
[im 204/306  lung]
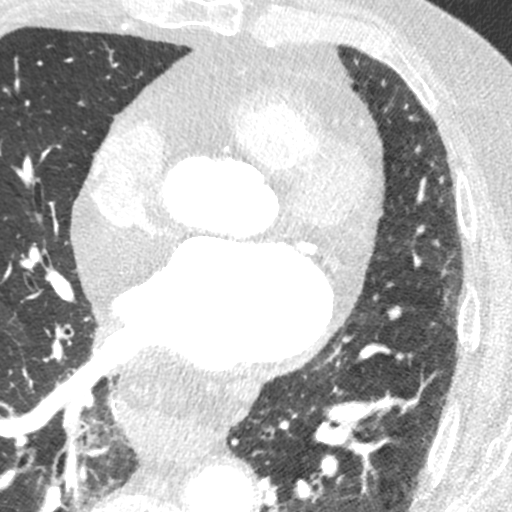

[8 of 20 positions shown; findings below may reference images not displayed]

FINDINGS: Non-cardiac: See separate report from [REDACTED].

Pulmonary veins drain normally to the left atrium.

Calcium Score: 284 Agatston units.

Coronary Arteries: Right dominant with no anomalies

LM: Calcified plaque distal left main, no significant stenosis.

LAD system: The proximal LAD is occluded after 2 small caliber
diagonals. There is some filling of the distal LAD by collaterals.

Circumflex system: Mixed plaque proximal LCx, no signiificant
stenosis. Mixed plaque proximal OM1, mild stenosis.

RCA system: Dominant but relatively small caliber. Calcified plaque
distal RCA, mild stenosis.
IMPRESSION: 1. Coronary artery calcium score 284 Agatston units, placing the
patient in the 35th percentile for age and gender.

2. The proximal LAD is occluded after 2 small diagonals. There is
some filling of the distal LAD by collaterals.

Zeinab Tiger

EXAM:
OVER-READ INTERPRETATION  CT CHEST

The following report is an over-read performed by radiologist Dr.
Kesha Wulff [REDACTED] on 10/01/2017. This
over-read does not include interpretation of cardiac or coronary
anatomy or pathology. The coronary calcium score/coronary CTA
interpretation by the cardiologist is attached.
FINDINGS: Aortic atherosclerosis. Moderate to large hiatal hernia. Within the
visualized portions of the thorax there are no suspicious appearing
pulmonary nodules or masses, there is no acute consolidative
airspace disease, no pleural effusions, no pneumothorax and no
lymphadenopathy. Visualized portions of the upper abdomen
demonstrates several small low-attenuation lesions throughout the
hepatic parenchyma, incompletely characterize, but statistically
likely cysts. There are no aggressive appearing lytic or blastic
lesions noted in the visualized portions of the skeleton.
IMPRESSION: 1.  Aortic Atherosclerosis ([id number]).
2. Moderate to large hiatal hernia.

## 2020-08-24 ENCOUNTER — Other Ambulatory Visit: Payer: Self-pay | Admitting: Student

## 2020-08-27 NOTE — Telephone Encounter (Signed)
Daughter is calling requesting this refill due to it not being received by the pharmacy.    *STAT* If patient is at the pharmacy, call can be transferred to refill team.   1. Which medications need to be refilled? (please list name of each medication and dose if known)  torsemide (DEMADEX) 20 MG tablet (Expired)  2. Which pharmacy/location (including street and city if local pharmacy) is medication to be sent to? [facility] - [place], [place] - [address]  3. Do they need a 30 day or 90 day supply? 90 day supply  Has appointment scheduled 09/16/20. Pt is completely out of medication.

## 2020-09-16 ENCOUNTER — Ambulatory Visit (INDEPENDENT_AMBULATORY_CARE_PROVIDER_SITE_OTHER): Payer: Medicare Other | Admitting: Cardiology

## 2020-09-16 ENCOUNTER — Encounter: Payer: Self-pay | Admitting: Cardiology

## 2020-09-16 ENCOUNTER — Other Ambulatory Visit: Payer: Self-pay

## 2020-09-16 VITALS — BP 122/62 | HR 55 | Ht 66.0 in | Wt 187.2 lb

## 2020-09-16 DIAGNOSIS — Z79899 Other long term (current) drug therapy: Secondary | ICD-10-CM

## 2020-09-16 DIAGNOSIS — I4819 Other persistent atrial fibrillation: Secondary | ICD-10-CM

## 2020-09-16 LAB — CBC
Hematocrit: 34.8 % — ABNORMAL LOW (ref 37.5–51.0)
Hemoglobin: 11.1 g/dL — ABNORMAL LOW (ref 13.0–17.7)
MCH: 27.3 pg (ref 26.6–33.0)
MCHC: 31.9 g/dL (ref 31.5–35.7)
MCV: 86 fL (ref 79–97)
Platelets: 158 10*3/uL (ref 150–450)
RBC: 4.07 x10E6/uL — ABNORMAL LOW (ref 4.14–5.80)
RDW: 13.1 % (ref 11.6–15.4)
WBC: 7 10*3/uL (ref 3.4–10.8)

## 2020-09-16 LAB — MAGNESIUM: Magnesium: 2.3 mg/dL (ref 1.6–2.3)

## 2020-09-16 NOTE — Patient Instructions (Addendum)
Medication Instructions:  Your physician recommends that you continue on your current medications as directed. Please refer to the Current Medication list given to you today.  *If you need a refill on your cardiac medications before your next appointment, please call your pharmacy*   Lab Work: Today: CBC and Magnesium If you have labs (blood work) drawn today and your tests are completely normal, you will receive your results only by: MyChart Message (if you have MyChart) OR A paper copy in the mail If you have any lab test that is abnormal or we need to change your treatment, we will call you to review the results.   Testing/Procedures: None ordered   Follow-Up: At [facility] [place], you and your health needs are our priority.  As part of our continuing mission to provide you with exceptional heart care, we have created designated Provider Care Teams.  These Care Teams include your primary Cardiologist (physician) and Advanced Practice Providers (APPs -  Physician Assistants and Nurse Practitioners) who all work together to provide you with the care you need, when you need it.    Your next appointment:   6 month(s)  The format for your next appointment:   In Person  Provider:   Loman Brooklyn, MD    Thank you for choosing [facility]!!   Dory Horn, RN [phone]   Other Instructions

## 2020-09-16 NOTE — Addendum Note (Signed)
Addended by: Baird Lyons on: 09/16/2020 11:18 AM   Modules accepted: Orders

## 2020-09-16 NOTE — Progress Notes (Signed)
Electrophysiology Office Note   Date:  09/16/2020   ID:  Ricard, Faulkner 21-Nov-1929, MRN [id number]  PCP:  Jim Like, MD  Cardiologist:   Primary Electrophysiologist:  Avis Mcmahill Jorja Loa, MD    No chief complaint on file.    History of Present Illness: Ronnie Torres is a 87 y.o. male who is being seen today for the evaluation of atrial fibrillation at the request of Atha Starks. Presenting today for electrophysiology evaluation.    He has a history of persistent atrial fibrillation, COPD, BPH, coronary artery disease.  He was initially on amiodarone but did not stay in rhythm.  He was switched to dofetilide.  He developed chest pain and had a left heart catheterization that showed an occluded LAD and no intervention was performed.  He wore a cardiac monitor that showed a 2% atrial fibrillation burden.  Today, denies symptoms of palpitations, chest pain, shortness of breath, orthopnea, PND, lower extremity edema, claudication, dizziness, presyncope, syncope, bleeding, or neurologic sequela. The patient is tolerating medications without difficulties.  He has had.  With Lasix on days that he can stay at home.  He wears TED hose.  His daughter's been trying to get him to wear depends, but he is refusing.   Past Medical History:  Diagnosis Date   Acid reflux    Anemia    Arthritis    Atrial fibrillation (HCC)    BPH (benign prostatic hyperplasia)    CAD (coronary artery disease)    Colon polyps    COPD (chronic obstructive pulmonary disease) (HCC)    COVID-19    Diverticulitis    Diverticulosis    Emphysema lung (HCC)    GERD (gastroesophageal reflux disease)    Gout    Hemorrhoids    Hiatal hernia    Sleep apnea    CPAP   Status post dilation of esophageal narrowing    Past Surgical History:  Procedure Laterality Date   CARDIOVERSION N/A 12/01/2017   Procedure: CARDIOVERSION;  Surgeon: Jake Bathe, MD;  Location: [facility] ENDOSCOPY;  Service: Cardiovascular;  Laterality:  N/A;   CORONARY ANGIOGRAPHY N/A 11/02/2017   Procedure: CORONARY ANGIOGRAPHY (CATH LAB);  Surgeon: Tonny Bollman, MD;  Location: [facility] INVASIVE CV LAB;  Service: Cardiovascular;  Laterality: N/A;   HEMORROIDECTOMY     KIDNEY STONE SURGERY     Basket     Current Outpatient Medications  Medication Sig Dispense Refill   acetaminophen (TYLENOL) 500 MG tablet Take 1,000 mg by mouth every 6 (six) hours as needed for moderate pain.     albuterol (PROVENTIL HFA;VENTOLIN HFA) 108 (90 Base) MCG/ACT inhaler Inhale 2 puffs into the lungs every 4 (four) hours as needed for wheezing or shortness of breath.      apixaban (ELIQUIS) 5 MG TABS tablet Take 1 tablet (5 mg total) by mouth 2 (two) times daily. 180 tablet 1   aspirin 81 MG EC tablet Take 81 mg by mouth every Monday, Wednesday, and Friday.     budesonide-formoterol (SYMBICORT) 80-4.5 MCG/ACT inhaler      colchicine 0.6 MG tablet Take by mouth.     dofetilide (TIKOSYN) 250 MCG capsule Take 1 capsule (250 mcg total) by mouth 2 (two) times daily. 180 capsule 3   ferrous sulfate 324 (65 Fe) MG TBEC Take 648 mg by mouth daily.     gabapentin (NEURONTIN) 100 MG capsule Take 100 mg by mouth 2 (two) times daily.      isosorbide mononitrate (IMDUR) 30 MG 24  hr tablet Take 1/2 (one-half) tablet by mouth once daily 45 tablet 3   Menthol, Topical Analgesic, 8 % LIQD Apply 1 application topically 2 (two) times daily as needed (joint pain).     metoprolol succinate (TOPROL-XL) 50 MG 24 hr tablet Take 1 tablet (50 mg total) by mouth daily. Take with or immediately following a meal. 90 tablet 2   omeprazole (PRILOSEC) 20 MG capsule Take 20 mg by mouth 2 (two) times daily.      polyethylene glycol (MIRALAX / GLYCOLAX) 17 g packet Take 17 g by mouth daily as needed for moderate constipation.     potassium chloride SA (KLOR-CON) 20 MEQ tablet Take 3 tablets (60 mEq total) by mouth daily. 270 tablet 3   senna-docusate (SENOKOT-S) 8.6-50 MG tablet Take 1 tablet by mouth  at bedtime as needed for mild constipation.     sertraline (ZOLOFT) 25 MG tablet Take 25 mg by mouth at bedtime.      tamsulosin (FLOMAX) 0.4 MG CAPS capsule Take 0.4 mg by mouth at bedtime.     torsemide (DEMADEX) 20 MG tablet Take 2 tablets (40 mg total) by mouth daily. 60 tablet 0   metolazone (ZAROXOLYN) 2.5 MG tablet Take 1 tablet (2.5 mg total) by mouth every Saturday. 30 tablet 3   No current facility-administered medications for this visit.    Allergies:   Shellfish allergy, Tape, Sulfa antibiotics, and Trazodone and nefazodone   Social History:  The patient  reports that he has quit smoking. He has quit using smokeless tobacco.  His smokeless tobacco use included chew. He reports that he does not drink alcohol and does not use drugs.   Family History:  The patient's family history includes Atrial fibrillation in his brother; Breast cancer in his daughter; CVA in his father; Dementia in his sister and sister; Kidney cancer in his daughter; Other in his mother; Pancreatic cancer in his brother; Stomach cancer in his brother.   ROS:  Please see the history of present illness.   Otherwise, review of systems is positive for none.   All other systems are reviewed and negative.   PHYSICAL EXAM: VS:  BP 122/62   Pulse (!) 55   Ht 5\' 6"  (1.676 m)   Wt 187 lb 3.2 oz (84.9 kg)   SpO2 98%   BMI 30.21 kg/m  , BMI Body mass index is 30.21 kg/m. GEN: Well nourished, well developed, in no acute distress  HEENT: normal  Neck: no JVD, carotid bruits, or masses Cardiac: RRR; no murmurs, rubs, or gallops,no edema  Respiratory:  clear to auscultation bilaterally, normal work of breathing GI: soft, nontender, nondistended, + BS MS: no deformity or atrophy  Skin: warm and dry Neuro:  Strength and sensation are intact Psych: euthymic mood, full affect  EKG:  EKG is ordered today. Personal review of the ekg ordered shows sinus rhythm  Recent Labs: 12/25/2019: BUN 23; Creatinine, Ser 1.24;  Potassium 3.7; Sodium 141 01/08/2020: Hemoglobin 10.7; Platelets 148    Lipid Panel  No results found for: CHOL, TRIG, HDL, CHOLHDL, VLDL, LDLCALC, LDLDIRECT   Wt Readings from Last 3 Encounters:  09/16/20 187 lb 3.2 oz (84.9 kg)  02/26/20 187 lb (84.8 kg)  02/02/20 187 lb 3.2 oz (84.9 kg)      Other studies Reviewed: Additional studies/ records that were reviewed today include: Myoview 06/29/2017 Review of the above records today demonstrates:  No inducible ischemia No wall motion abnormalities Normal left ventricular function  TTE  10/01/17 - Left ventricle: The cavity size was normal. There was mild   concentric hypertrophy. Systolic function was mildly reduced. The   estimated ejection fraction was in the range of 45% to 50%.   Diffuse hypokinesis. - Aortic valve: Transvalvular velocity was within the normal range.   There was no stenosis. There was no regurgitation. - Mitral valve: Moderately calcified annulus. Calcification.   Transvalvular velocity was within the normal range. There was no   evidence for stenosis. There was trivial regurgitation. - Left atrium: The atrium was moderately dilated. - Right ventricle: The cavity size was normal. Wall thickness was   normal. Systolic function was normal. - Tricuspid valve: There was mild regurgitation. - Pulmonary arteries: Systolic pressure was within the normal   range. PA peak pressure: 18 mm Hg (S).  LHC 11/02/17 Ost 2nd Mrg to 2nd Mrg lesion is 30% stenosed. Prox LAD to Mid LAD lesion is 100% stenosed.   1.  Total occlusion of the LAD just after the first diagonal branch 2.  Minor nonobstructive disease involving the left main, left circumflex, and RCA  Cardiac monitor 02/20/2020 personally reviewed Predominant rhythm was sinus rhythm.  Atrial fibrillation also noted, rapid response 89%.  PVCs and PACs noted.  PVC burden 4%.    ASSESSMENT AND PLAN:  1.  Persistent atrial fibrillation: Currently on Eliquis and  dofetilide.  High risk medication monitoring.  CHA2DS2-VASc of 3.  Minimal episodes of atrial fibrillation.  Continue with current management.  2.  Coronary artery disease with chronic stable angina: Occluded LAD.  Continue Imdur.  3.  Lower extremity edema: Has 1-2+ edema.  He is on both torsemide and as needed metolazone.  His daughter has been adjusting his medications.   Current medicines are reviewed at length with the patient today.   The patient does not have concerns regarding his medicines.  The following changes were made today:   Labs/ tests ordered today include:  Orders Placed This Encounter  Procedures   Basic metabolic panel   CBC   Magnesium   EKG 12-Lead      Disposition:   FU with Eudell Julian  6 months  Signed, Jakari Jacot Jorja Loa, MD  09/16/2020 11:14 AM     [facility] [address] [place] [address] [phone] (office) [phone] (fax)

## 2020-09-28 ENCOUNTER — Other Ambulatory Visit: Payer: Self-pay | Admitting: Cardiology

## 2020-10-14 ENCOUNTER — Telehealth: Payer: Self-pay

## 2020-10-14 NOTE — Telephone Encounter (Signed)
**Note De-Identified La Dibella Obfuscation** Per the pts daughter's request I called [employer] pt asst Foundation at [phone] and ordered the pts next 90 day shipment of Tikosyn with Britta Mccreedy. Pt ID: [id number] Order #: [id number]  I have notified the pts daughter [name] [facility]) that I have placed this Tikosyn order.

## 2020-10-17 NOTE — Telephone Encounter (Signed)
Called pt's daughter [name] and left a message informing her that the pt's medication from [employer] patient assistance program, was ready for pt to pick up at the front desk, [facility] [address] and if they have any other problems, questions or concerns, to give the office a call back.  3 bottles of Tikosyn 250 mcg capsules, 60 capsules per bottle.   TTS:[id number]   EXP: December/2023

## 2020-11-02 ENCOUNTER — Other Ambulatory Visit: Payer: Self-pay | Admitting: Student

## 2020-11-14 ENCOUNTER — Telehealth: Payer: Self-pay

## 2020-11-14 NOTE — Telephone Encounter (Signed)
**Note De-Identified Ronnie Torres Obfuscation** The pts completed [employer]MS[employer] for Eliquis with documents was left at the office.  I have completed the providers page of his application and have e-mailed all to Dr [employer] nurse so she can obtain his signature, date it, and to fax all to [employer] at the fax number written on the cover letter included or to place in the to be faxed basket in Medical Records to be faxed.

## 2020-11-26 NOTE — Telephone Encounter (Signed)
Called dtr Aware signed information was faxed last week. She tells me that she spoke with them this morning and they have the paperwork. She appreciates my follow up

## 2020-11-28 NOTE — Telephone Encounter (Deleted)
**Note De-Identified Paralee Pendergrass Obfuscation** Letter received Elanora Quin fax stating that [employer] has approved the pt for Entresto assistance until 01/25/2021. Application #: [id number]  The letter states that they have notified the pt of this approval as well.

## 2020-12-03 NOTE — Telephone Encounter (Signed)
**Note De-Identified Riyaan Heroux Obfuscation** Letter received from [facility] stating that they need [place] to fax them the pts entire application as some information was missing or not readable.  I am forwarding this message to Dr Gershon Crane nurse so she can re-fax the pts application to them again.

## 2020-12-05 NOTE — Telephone Encounter (Signed)
Re faxed confirmation received

## 2021-01-14 ENCOUNTER — Telehealth: Payer: Self-pay

## 2021-01-14 NOTE — Telephone Encounter (Signed)
**Note De-Identified Aaralyn Kil Obfuscation** The pt left his completed [employer] pt asst application for Tikosyn at the office with documents.  I have completed the providers page of his application and e-mailed all to Dr [employer] nurse so she can obtain his signature, date it, and to fax all to [employer] at the fax number written on the cover letter included.

## 2021-01-26 ENCOUNTER — Encounter: Payer: Self-pay | Admitting: Cardiology

## 2021-01-28 NOTE — Telephone Encounter (Signed)
Printed for MD to sign.

## 2021-01-28 NOTE — Telephone Encounter (Signed)
**Note De-Identified Ronnie Torres Obfuscation** Per the pts daughter, Ronnie Torres message:  January 26, 2021 Ronnie Torres (proxy for Ronnie Torres) to [facility] [address] Triage (supporting Ronnie Torres, Ronnie Coss, MD)   9:54 PM This message is being sent by Ronnie Torres on behalf of Ronnie Torres.   I wanted to know if Dads financial assistance form was sent to  [employer]? I havent heard anything from them. If it was sent I am going to contact them in the morning. Thankyouy  Ronnie [employer] nurse is off today and because I am unsure if she faxed this pts application to [facility] on 12/20 or not I am emailing it to the nurse working with Ronnie Torres today so she can obtain his signature and to fax all to [facility] pt asst Foundation at the fax number written on the cover letter included or to place in the to be faxed basket in Medical Records to be faxed.

## 2021-01-28 NOTE — Telephone Encounter (Signed)
MD signed. Place in nurse fax box.

## 2021-01-31 ENCOUNTER — Telehealth: Payer: Self-pay | Admitting: *Deleted

## 2021-01-31 ENCOUNTER — Encounter: Payer: Self-pay | Admitting: Cardiology

## 2021-01-31 MED ORDER — TORSEMIDE 20 MG PO TABS: 40.0000 mg | ORAL_TABLET | Freq: Every day | ORAL | 2 refills | Status: DC

## 2021-01-31 MED ORDER — APIXABAN 5 MG PO TABS: 5.0000 mg | ORAL_TABLET | Freq: Two times a day (BID) | ORAL | 0 refills | Status: DC

## 2021-01-31 MED ORDER — METOLAZONE 2.5 MG PO TABS: ORAL_TABLET | ORAL | 2 refills | Status: DC

## 2021-01-31 MED ORDER — DOFETILIDE 250 MCG PO CAPS: 250.0000 ug | ORAL_CAPSULE | Freq: Two times a day (BID) | ORAL | 2 refills | Status: DC

## 2021-01-31 MED ORDER — METOPROLOL SUCCINATE ER 50 MG PO TB24: 50.0000 mg | ORAL_TABLET | Freq: Every day | ORAL | 2 refills | Status: DC

## 2021-01-31 MED ORDER — ISOSORBIDE MONONITRATE ER 30 MG PO TB24: ORAL_TABLET | ORAL | 2 refills | Status: DC

## 2021-01-31 MED ORDER — POTASSIUM CHLORIDE CRYS ER 20 MEQ PO TBCR: 60.0000 meq | EXTENDED_RELEASE_TABLET | Freq: Every day | ORAL | 2 refills | Status: DC

## 2021-01-31 NOTE — Telephone Encounter (Signed)
Prescription refill request for Eliquis received. Indication: afib  Last office visit: [name], 09/16/2020 Scr: 1.65, 09/13/2020 Age: 88 yo  Weight:  84.9 kg   Pt qualifies for Eliquis 2.5mg  BID per dosing criteria.

## 2021-01-31 NOTE — Telephone Encounter (Signed)
Paperwork signed in both places and faxed at 5:05 pm, confirmation received.

## 2021-01-31 NOTE — Telephone Encounter (Signed)
Spoke with Edythe Lynn D who stated she would like for pt to come in for labs before making a dose change to follow SCR trends.   Called and spoke to pt's daughter who stated that she believes pt just got his labs done at his PCP office who is independent of [facility]. Daughter stated that she would call pt's PCP on Monday to see if they could fax over a copy of pt's labs ( specifically Scr) with in the past year. Gave pt the number to the anticoagulation fax machine [phone] and the telephone number to the anticoagulation clinic [phone]. Daughter stated that she would give [place] a call on Monday ( 02/03/2021) and give [place] an update so we could be on the look out for labs.   Informed daughter that these are numbers just for the anticoagulation clinic and are not the numbers to call if she needs to ever reach cardiologist or if there is an emergency.

## 2021-01-31 NOTE — Telephone Encounter (Signed)
Refill sent in to mail order without refills in the mean time so that pt does not run out of medication. Can decrease dose of Eliquis if needed pending labs that are faxed over (will be able to cut 5mg  tab in half).

## 2021-02-01 ENCOUNTER — Other Ambulatory Visit: Payer: Self-pay | Admitting: Cardiology

## 2021-02-03 NOTE — Telephone Encounter (Signed)
Labs received via fax from pt's PCP office at [facility]. Pt creatinine trends are as follows: 12/21/2020-1.33, 06/12/2020-1.17, and 04/11/2020-1.61 from PCP office.  Eliquis refill was already sent on 01/31/2021 for 3 month supply.

## 2021-02-03 NOTE — Telephone Encounter (Signed)
Spoke with pt's dtr, [name], and she is aware that the [t will remain on the Eliquis 5mg  dose at this time.  Eliquis 5mg  refill request received. Patient is 88 years old, weight-84.9kg, Crea-1.33 on 12/21/2020 via faxed labs from PCP, Diagnosis-Afib, and last seen by Dr. Curt Bears on 09/16/2020. Dose is appropriate based on dosing criteria. Will send in refill to requested pharmacy.

## 2021-02-03 NOTE — Telephone Encounter (Signed)
Dtr called and stated the PCOP office will be sending labs over via fax.

## 2021-02-04 ENCOUNTER — Encounter: Payer: Self-pay | Admitting: Cardiology

## 2021-02-06 ENCOUNTER — Telehealth: Payer: Self-pay

## 2021-02-06 ENCOUNTER — Encounter: Payer: Self-pay | Admitting: Cardiology

## 2021-02-06 MED ORDER — METOLAZONE 2.5 MG PO TABS: ORAL_TABLET | ORAL | 0 refills | Status: DC

## 2021-02-06 NOTE — Telephone Encounter (Signed)
**Note De-Identified Ved Martos Obfuscation** We received a form from [employer] and per the pts daughter, [name] it is for the pt's Tikosyn. I have completed the form and have e-mailed it to Dr [employer] nurse so she can fax it to [employer] at the fax number written on the cover letter included.

## 2021-02-06 NOTE — Telephone Encounter (Signed)
Faxed, along with copies of insurance card information. Confirmation received

## 2021-02-14 ENCOUNTER — Telehealth: Payer: Self-pay

## 2021-02-14 NOTE — Telephone Encounter (Signed)
**Note De-Identified Dariann Huckaba Obfuscation** Te pts 2nd completed [employer] pt asst application for Tikosyn was eft at the office (the last application left at the office was an old version and [place] would not accept).  I have completed the providers page of his application and have emailed it to Dr [employer] nurse so she can obtain his signature on the first and second page, date it, and to fax to [employer] at the fax number written on the cover letter included or to place in medical Records to be faxed.

## 2021-02-14 NOTE — Telephone Encounter (Signed)
Faxed 3:35 pm, confirmation received

## 2021-02-19 ENCOUNTER — Telehealth: Payer: Self-pay

## 2021-02-19 NOTE — Telephone Encounter (Signed)
Delivery received in the office today from [facility] with 3 bottles of Tikosyn 250 mcg tablets (60 capsules in each bottle) for this patient.   Called and spoke to daughter [name] (DPR on file) and let her know I have left this up front for the patient to pick up.

## 2021-03-03 ENCOUNTER — Encounter: Payer: Self-pay | Admitting: Podiatry

## 2021-03-03 ENCOUNTER — Ambulatory Visit (INDEPENDENT_AMBULATORY_CARE_PROVIDER_SITE_OTHER): Payer: Medicare Other

## 2021-03-03 ENCOUNTER — Ambulatory Visit (INDEPENDENT_AMBULATORY_CARE_PROVIDER_SITE_OTHER): Payer: Medicare Other | Admitting: Podiatry

## 2021-03-03 ENCOUNTER — Other Ambulatory Visit: Payer: Self-pay

## 2021-03-03 DIAGNOSIS — M1A9XX1 Chronic gout, unspecified, with tophus (tophi): Secondary | ICD-10-CM

## 2021-03-03 DIAGNOSIS — M7752 Other enthesopathy of left foot: Secondary | ICD-10-CM

## 2021-03-03 DIAGNOSIS — M2041 Other hammer toe(s) (acquired), right foot: Secondary | ICD-10-CM | POA: Diagnosis not present

## 2021-03-03 DIAGNOSIS — M2042 Other hammer toe(s) (acquired), left foot: Secondary | ICD-10-CM | POA: Diagnosis not present

## 2021-03-03 MED ORDER — TRIAMCINOLONE ACETONIDE 10 MG/ML IJ SUSP
10.0000 mg | Freq: Once | INTRAMUSCULAR | Status: AC
  Administered 2021-03-03: 10 mg

## 2021-03-04 NOTE — Progress Notes (Signed)
Subjective:   Patient ID: Ronnie Torres, male   DOB: 88 y.o.   MRN: [id number]   HPI Patient presents with swelling of the second digit left foot distal joint that also has a small yellow like appearance.  He has several of these joints on his hands that are also inflamed to the same degree and patient does have history of gout that he takes different medicines for and is relatively well controlled.  Patient presents with caregivers does not smoke likes to be active if possible but is not able to do much at this point   Review of Systems  All other systems reviewed and are negative.      Objective:  Physical Exam Vitals and nursing note reviewed.  Constitutional:      Appearance: He is well-developed.  Pulmonary:     Effort: Pulmonary effort is normal.  Musculoskeletal:        General: Normal range of motion.  Skin:    General: Skin is warm.  Neurological:     Mental Status: He is alert.    Neurovascular status found to be mildly diminished but intact with patient found to have swollen second digit left distal joint with indications of previous caseous deposit.  It is quite sore and hard for him to wear shoe gear with and has been present for extended periods of time and patient does have good digital perfusion well oriented     Assessment:  Gout condition chronic with probable acuteness to it along with inflammatory inflammation with probable gouty arthritis distal second digit left with pain     Plan:  H&P condition reviewed x-rays taken.  Today I did sterile block of the area and using sterile prep I then carefully injected the second distal interphalangeal joint with 1 mg dexamethasone 1 mg Kenalog applied sterile dressing and I am hoping this will reduce the inflammation but I do not see any surgical correction we can do given advanced age and other pathology.  Hopefully this will buy him time to he is to be seen back as symptoms indicate and continue to take his medications as  indicated  X-rays indicate there is signs of gouty arthritis distal joint digit to left with moderate destruction of the joint surface

## 2021-03-08 ENCOUNTER — Encounter: Payer: Self-pay | Admitting: Cardiology

## 2021-03-10 MED ORDER — METOLAZONE 2.5 MG PO TABS: ORAL_TABLET | ORAL | 0 refills | Status: DC

## 2021-03-10 NOTE — Telephone Encounter (Signed)
Spoke to dtr. Requested Rx sent in Dtr thanks [place] for the help

## 2021-03-24 ENCOUNTER — Encounter: Payer: Self-pay | Admitting: Cardiology

## 2021-03-25 MED ORDER — METOLAZONE 2.5 MG PO TABS: ORAL_TABLET | ORAL | 0 refills | Status: DC

## 2021-03-31 ENCOUNTER — Encounter: Payer: Self-pay | Admitting: Cardiology

## 2021-03-31 ENCOUNTER — Ambulatory Visit (INDEPENDENT_AMBULATORY_CARE_PROVIDER_SITE_OTHER): Payer: Medicare Other | Admitting: Cardiology

## 2021-03-31 ENCOUNTER — Other Ambulatory Visit: Payer: Self-pay

## 2021-03-31 VITALS — BP 110/50 | HR 84 | Ht 66.0 in | Wt 194.8 lb

## 2021-03-31 DIAGNOSIS — I4819 Other persistent atrial fibrillation: Secondary | ICD-10-CM

## 2021-03-31 DIAGNOSIS — I5032 Chronic diastolic (congestive) heart failure: Secondary | ICD-10-CM | POA: Diagnosis not present

## 2021-03-31 DIAGNOSIS — D6869 Other thrombophilia: Secondary | ICD-10-CM

## 2021-03-31 MED ORDER — METOPROLOL SUCCINATE ER 25 MG PO TB24: 25.0000 mg | ORAL_TABLET | Freq: Every day | ORAL | 3 refills | Status: DC

## 2021-03-31 NOTE — Patient Instructions (Addendum)
Medication Instructions:  ?Decrease Metoprolol to 25mg  once daily ? ?*If you need a refill on your cardiac medications before your next appointment, please call your pharmacy* ? ? ?Lab Work: ?TODAY - MAG, BMET  ? ? ?Follow-Up: ?At [facility], you and your health needs are our priority.  As part of our continuing mission to provide you with exceptional heart care, we have created designated Provider Care Teams.  These Care Teams include your primary Cardiologist (physician) and Advanced Practice Providers (APPs -  Physician Assistants and Nurse Practitioners) who all work together to provide you with the care you need, when you need it. ? ?We recommend signing up for the patient portal called "MyChart".  Sign up information is provided on this After Visit Summary.  MyChart is used to connect with patients for Virtual Visits (Telemedicine).  Patients are able to view lab/test results, encounter notes, upcoming appointments, etc.  Non-urgent messages can be sent to your provider as well.   ?To learn more about what you can do with MyChart, go to [facility].   ? ? ?Your next appointment:   ?6 month(s) ? ? ?The format for your next appointment:   ?In Person ? ? ?Provider:   ?Dr.Will [name] ? ? ? ? ?

## 2021-03-31 NOTE — Progress Notes (Signed)
Electrophysiology Office Note   Date:  03/31/2021   ID:  Ronnie Torres, DOB 10/31/1929, MRN [id number]  PCP:  Ronnie Pulling, MD  Cardiologist:   Primary Electrophysiologist:  Ronnie Torres Ronnie Leeds, MD    No chief complaint on file.     History of Present Illness: Ronnie Torres is a 88 y.o. male who is being seen today for the evaluation of atrial fibrillation at the request of Ronnie Torres. Presenting today for electrophysiology evaluation.    He has a history significant for persistent atrial fibrillation, COPD, BPH, coronary artery disease.  He was initially on amiodarone but he did not stay in rhythm.  He was switched to dofetilide.  He developed chest Torres and had a left heart catheterization that showed an occluded LAD and no intervention was performed.  He wore a cardiac monitor that showed a 2% atrial fibrillation burden.  Today, denies symptoms of palpitations, chest Torres,  orthopnea, PND, claudication, dizziness, presyncope, syncope, bleeding, or neurologic sequela. The patient is tolerating medications without difficulties.  Unfortunately begun to have more frequent falls.  He has had rib fractures in the past.  He is also been short of breath.  He has not been good about taking his torsemide or metolazone.  He does have lower extremity edema.   Past Medical History:  Diagnosis Date   Acid reflux    Anemia    Arthritis    Atrial fibrillation (HCC)    BPH (benign prostatic hyperplasia)    CAD (coronary artery disease)    Colon polyps    COPD (chronic obstructive pulmonary disease) (HCC)    COVID-19    Diverticulitis    Diverticulosis    Emphysema lung (HCC)    GERD (gastroesophageal reflux disease)    Gout    Hemorrhoids    Hiatal hernia    Sleep apnea    CPAP   Status post dilation of esophageal narrowing    Past Surgical History:  Procedure Laterality Date   CARDIOVERSION N/A 12/01/2017   Procedure: CARDIOVERSION;  Surgeon: Ronnie Pain, MD;  Location: [facility];  Service: Cardiovascular;  Laterality: N/A;   CORONARY ANGIOGRAPHY N/A 11/02/2017   Procedure: CORONARY ANGIOGRAPHY (CATH LAB);  Surgeon: Ronnie Mocha, MD;  Location: [facility];  Service: Cardiovascular;  Laterality: N/A;   HEMORROIDECTOMY     KIDNEY STONE SURGERY     Basket     Current Outpatient Medications  Medication Sig Dispense Refill   acetaminophen (TYLENOL) 500 MG tablet Take 1,000 mg by mouth every 6 (six) hours as needed for moderate Torres.     albuterol (PROVENTIL HFA;VENTOLIN HFA) 108 (90 Base) MCG/ACT inhaler Inhale 2 puffs into the lungs every 4 (four) hours as needed for wheezing or shortness of breath.      allopurinol (ZYLOPRIM) 100 MG tablet Take 100 mg by mouth daily.     apixaban (ELIQUIS) 5 MG TABS tablet Take 1 tablet (5 mg total) by mouth 2 (two) times daily. 180 tablet 0   aspirin 81 MG EC tablet Take 81 mg by mouth every Monday, Wednesday, and Friday.     budesonide-formoterol (SYMBICORT) 80-4.5 MCG/ACT inhaler Inhale 2 puffs into the lungs as directed.     colchicine 0.6 MG tablet Take 0.6 mg by mouth daily as needed.     dofetilide (TIKOSYN) 250 MCG capsule Take 1 capsule (250 mcg total) by mouth 2 (two) times daily. 180 capsule 2   ferrous sulfate 324 (65 Fe) MG TBEC Take  648 mg by mouth daily.     gabapentin (NEURONTIN) 100 MG capsule Take 100 mg by mouth 2 (two) times daily.      isosorbide mononitrate (IMDUR) 30 MG 24 hr tablet Take 1/2 (one-half) tablet by mouth once daily 45 tablet 2   Menthol, Topical Analgesic, 8 % LIQD Apply 1 application topically 2 (two) times daily as needed (joint Torres).     metolazone (ZAROXOLYN) 2.5 MG tablet TAKE ONE TABLET BY MOUTH EVERY SATURDAY 2 tablet 0   metoprolol succinate (TOPROL-XL) 50 MG 24 hr tablet Take 1 tablet (50 mg total) by mouth daily. Take with or immediately following a meal. 90 tablet 2   omeprazole (PRILOSEC) 20 MG capsule Take 20 mg by mouth 2 (two) times daily.      polyethylene glycol  (MIRALAX / GLYCOLAX) 17 g packet Take 17 g by mouth daily as needed for moderate constipation.     potassium chloride SA (KLOR-CON M) 20 MEQ tablet Take 3 tablets (60 mEq total) by mouth daily. 270 tablet 2   senna-docusate (SENOKOT-S) 8.6-50 MG tablet Take 1 tablet by mouth at bedtime as needed for mild constipation.     sertraline (ZOLOFT) 50 MG tablet Take 50 mg by mouth at bedtime.     tamsulosin (FLOMAX) 0.4 MG CAPS capsule Take 0.4 mg by mouth at bedtime.     torsemide (DEMADEX) 20 MG tablet Take 2 tablets (40 mg total) by mouth daily. 180 tablet 2   vitamin B-12 (CYANOCOBALAMIN) 1000 MCG tablet Take 1,000 mcg by mouth every Monday, Wednesday, and Friday.     No current facility-administered medications for this visit.    Allergies:   Shellfish allergy, Tape, Sulfa antibiotics, and Trazodone and nefazodone   Social History:  The patient  reports that he has quit smoking. He has quit using smokeless tobacco.  His smokeless tobacco use included chew. He reports that he does not drink alcohol and does not use drugs.   Family History:  The patient's family history includes Atrial fibrillation in his brother; Breast cancer in his daughter; CVA in his father; Dementia in his sister and sister; Kidney cancer in his daughter; Other in his mother; Pancreatic cancer in his brother; Stomach cancer in his brother.   ROS:  Please see the history of present illness.   Otherwise, review of systems is positive for none.   All other systems are reviewed and negative.   PHYSICAL EXAM: VS:  BP (!) 110/50    Pulse 84    Ht 5\' 6"  (1.676 m)    Wt 194 lb 12.8 oz (88.4 kg)    SpO2 97%    BMI 31.44 kg/m  , BMI Body mass index is 31.44 kg/m. GEN: Well nourished, well developed, in no acute distress  HEENT: normal  Neck: no JVD, carotid bruits, or masses Cardiac: RRR; no murmurs, rubs, or gallops,no edema  Respiratory:  clear to auscultation bilaterally, normal work of breathing GI: soft, nontender,  nondistended, + BS MS: no deformity or atrophy  Skin: warm and dry Neuro:  Strength and sensation are intact Psych: euthymic mood, full affect  EKG:  EKG is ordered today. Personal review of the ekg ordered shows sinus rhythm  Recent Labs: 09/16/2020: Hemoglobin 11.1; Magnesium 2.3; Platelets 158    Lipid Panel  No results found for: CHOL, TRIG, HDL, CHOLHDL, VLDL, LDLCALC, LDLDIRECT   Wt Readings from Last 3 Encounters:  03/31/21 194 lb 12.8 oz (88.4 kg)  09/16/20 187 lb 3.2  oz (84.9 kg)  02/26/20 187 lb (84.8 kg)      Other studies Reviewed: Additional studies/ records that were reviewed today include: Myoview 06/29/2017 Review of the above records today demonstrates:  No inducible ischemia No wall motion abnormalities Normal left ventricular function  TTE 10/01/17 - Left ventricle: The cavity size was normal. There was mild   concentric hypertrophy. Systolic function was mildly reduced. The   estimated ejection fraction was in the range of 45% to 50%.   Diffuse hypokinesis. - Aortic valve: Transvalvular velocity was within the normal range.   There was no stenosis. There was no regurgitation. - Mitral valve: Moderately calcified annulus. Calcification.   Transvalvular velocity was within the normal range. There was no   evidence for stenosis. There was trivial regurgitation. - Left atrium: The atrium was moderately dilated. - Right ventricle: The cavity size was normal. Wall thickness was   normal. Systolic function was normal. - Tricuspid valve: There was mild regurgitation. - Pulmonary arteries: Systolic pressure was within the normal   range. PA peak pressure: 18 mm Hg (S).  LHC 11/02/17 Ost 2nd Mrg to 2nd Mrg lesion is 30% stenosed. Prox LAD to Mid LAD lesion is 100% stenosed.   1.  Total occlusion of the LAD just after the first diagonal branch 2.  Minor nonobstructive disease involving the left main, left circumflex, and RCA  Cardiac monitor 02/20/2020  personally reviewed Predominant rhythm was sinus rhythm.  Atrial fibrillation also noted, rapid response 89%.  PVCs and PACs noted.  PVC burden 4%.    ASSESSMENT AND PLAN:  1.  Persistent atrial fibrillation: Currently on Eliquis 5 mg twice daily, dofetilide 250 mcg twice daily.  CHA2DS2-VASc of 3.  High risk medication monitoring for dofetilide.  He fortunately remains in sinus rhythm.  Despite that, he is having falls and thus we Jessilynn Taft reduce his metoprolol dose.  2.  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  Continue Imdur.  3.  Secondary hypercoagulable state: Currently on Eliquis for atrial fibrillation as above  4.  Chronic diastolic heart failure: Has lower extremity edema and shortness of breath.  He has been intermittently taking his torsemide and metolazone.  His daughter Jerred Zaremba try to get him to take it more often    Current medicines are reviewed at length with the patient today.   The patient does not have concerns regarding his medicines.  The following changes were made today:.  Reduce metoprolol  Labs/ tests ordered today include:  Orders Placed This Encounter  Procedures   EKG 12-Lead      Disposition:   FU with Latash Nouri 6 months  Signed, Yamira Papa Ronnie Leeds, MD  03/31/2021 2:42 PM     [facility] [address] [place] [address] [phone] (office) [phone] (fax)

## 2021-04-01 LAB — BASIC METABOLIC PANEL
BUN/Creatinine Ratio: 11 (ref 10–24)
BUN: 16 mg/dL (ref 8–27)
CO2: 23 mmol/L (ref 20–29)
Calcium: 8.8 mg/dL (ref 8.6–10.2)
Chloride: 106 mmol/L (ref 96–106)
Creatinine, Ser: 1.51 mg/dL — ABNORMAL HIGH (ref 0.76–1.27)
Glucose: 110 mg/dL — ABNORMAL HIGH (ref 70–99)
Potassium: 3.9 mmol/L (ref 3.5–5.2)
Sodium: 140 mmol/L (ref 134–144)
eGFR: 44 mL/min/{1.73_m2} — ABNORMAL LOW (ref >59)

## 2021-04-01 LAB — MAGNESIUM: Magnesium: 2.1 mg/dL (ref 1.6–2.3)

## 2021-04-03 ENCOUNTER — Telehealth: Payer: Self-pay

## 2021-04-03 NOTE — Telephone Encounter (Signed)
**Note De-Identified Sydney Azure Obfuscation** The pts completed BMSPAF for Eliquis was left at the office with documents. ? ?I have completed the providers page of his application and have e-mailed it to Dr [employer] nurse so she can obtain his signature, date it, and to fax all to [facility] at the fax number written on the cover letter included. ?

## 2021-04-03 NOTE — Telephone Encounter (Signed)
Faxed, confirmation received 

## 2021-04-17 NOTE — Telephone Encounter (Signed)
**Note De-Identified Ronnie Torres Obfuscation** Letter received from [facility] stating that they denied the pt for asst with Eliquis. ?Reason: ?Documentation of 3% out of pocket RX expenses, based on the household adjusted gross income, not met. ? ?The letter states that they have notified the pt of this denial as well. ?

## 2021-04-24 ENCOUNTER — Encounter: Payer: Self-pay | Admitting: Cardiology

## 2021-04-24 DIAGNOSIS — I5032 Chronic diastolic (congestive) heart failure: Secondary | ICD-10-CM

## 2021-04-29 NOTE — Telephone Encounter (Signed)
**Note De-Identified Moe Graca Obfuscation** Letter received Ronnie Torres fax from [facility] stating that they have approved the pt for asst with Eliquis until 01/25/2022. ?BMSPAF Case #: [id number] ? ?The letter states that they have notified the pt of this approval as well. ?

## 2021-05-07 ENCOUNTER — Encounter: Payer: Self-pay | Admitting: Cardiology

## 2021-06-06 ENCOUNTER — Other Ambulatory Visit: Payer: Self-pay | Admitting: Cardiology

## 2021-06-06 NOTE — Telephone Encounter (Signed)
Epimenio Foot, LPN, can you please help with this. Pt's daughter does not know what pharmacy pt's medication refill should go. Please advise. Thanks  ?

## 2021-06-06 NOTE — Telephone Encounter (Signed)
?*  STAT* If patient is at the pharmacy, call can be transferred to refill team. ? ? ?1. Which medications need to be refilled? (please list name of each medication and dose if known)  ? dofetilide (TIKOSYN) 250 MCG capsule  ? ? ?2. Which pharmacy/location (including street and city if local pharmacy) is medication to be sent to? NA ? ?3. Do they need a 30 day or 90 day supply?  ?90 day ? ?Pt's daughter states that pt gets medication from [facility] for free. Please advise ? ?

## 2021-06-09 ENCOUNTER — Telehealth: Payer: Self-pay

## 2021-06-09 NOTE — Telephone Encounter (Signed)
**Note De-Identified Ronnie Torres Obfuscation** I called [employer] [phone]) and ordered the pts next Tikosyn shipment with [name]. ?Pt ID #: [id number] ?Per [name] the pts Tikosyn shipment will arrive at the office within 7 to 10 business days from today and that he has 2 more fills remaining before 01/25/2022. ?His next fill date is 08/16/2021. ? ?This order ID # is: [id number] ?

## 2021-06-09 NOTE — Telephone Encounter (Signed)
**Note De-Identified Ronnie Torres Obfuscation** I called [employer] [phone]) and ordered the pts next Tikosyn shipment with Ronnie Torres. ?Pt ID #: [id number] ?Per Ronnie Torres the pts Tikosyn shipment will arrive at the office within 7 to 10 business days from today and that he has 2 more fills remaining before 01/25/2022. ?His next fill date is 08/16/2021. ? ?This order ID # is: [id number] ? ?I have sent the pt and his daughter, Ronnie Torres a [id number] message advising that I have ordered his next Tikosyn shipment through [facility]. ?

## 2021-06-09 NOTE — Telephone Encounter (Deleted)
**Note De-Identified Vernona Peake Obfuscation** I called [employer] [phone]) and ordered the pts next Tikosyn shipment with [name]. ?Per [name] the pts Tikosyn will arrive at the office within 7 to 10 business days and that he has 2 more fills remaining before his approval in the [employer] asst program  ?

## 2021-06-24 ENCOUNTER — Encounter: Payer: Self-pay | Admitting: Cardiology

## 2021-06-24 NOTE — Telephone Encounter (Signed)
Called pt's daughter and left message informing her that the pt's medication tikosyn 250 mg capsules, 3 bottles of 60 capsules, are at [facility] front desk for them to pick up and if they have any other problems, questions or concerns, to give our office a call back. Lot # [id number]  Exp: 02/2023

## 2021-07-02 NOTE — Progress Notes (Unsigned)
Cardiology Office Note:    Date:  07/02/2021   ID:  Ronnie Torres, DOB July 27, 1929, MRN [id number]  PCP:  Redmond Pulling, MD   [facility] Providers Cardiologist:  None Electrophysiologist:  Will Meredith Leeds, MD {   Referring MD: Constance Haw, MD    History of Present Illness:    Ronnie Torres is a 88 y.o. male with a hx of CAD with CTO of LAD managed medically, chronic diastolic HF, Afib on dofetilide, COPD, and GERD who was referred by Dr. Curt Bears for further evaluation of HFpEF.  Per review of the record, the patient has a history of CAD with coronary CTA in 2019 revealing occlusion of prox LAD after 2 small diagnals. The distal LAD filled by collaterals. Mild distal RCA stenosis. Mild stenosis of OM1. Follow-up cath in 10/2017 confirmed CTO of prox LAD. He was managed medically.  Also has a history of mildly reduced LVEF 45-50% in 2019 that improved to 65-70% on TTE in 2020 as well as a history of persistent Afib for which he follows with Dr. Curt Bears.  Last saw Dr. Curt Bears in 03/2021 where he was suffering from SOB and LE edema in the setting of not taking his torsemide or metolazone. He also was having more frequent falls. He has since been switched off apixaban to ASA given frequent falls.   Today, ***  Past Medical History:  Diagnosis Date   Acid reflux    Anemia    Arthritis    Atrial fibrillation (HCC)    BPH (benign prostatic hyperplasia)    CAD (coronary artery disease)    Colon polyps    COPD (chronic obstructive pulmonary disease) (HCC)    COVID-19    Diverticulitis    Diverticulosis    Emphysema lung (HCC)    GERD (gastroesophageal reflux disease)    Gout    Hemorrhoids    Hiatal hernia    Sleep apnea    CPAP   Status post dilation of esophageal narrowing     Past Surgical History:  Procedure Laterality Date   CARDIOVERSION N/A 12/01/2017   Procedure: CARDIOVERSION;  Surgeon: Jerline Pain, MD;  Location: [facility];  Service:  Cardiovascular;  Laterality: N/A;   CORONARY ANGIOGRAPHY N/A 11/02/2017   Procedure: CORONARY ANGIOGRAPHY (CATH LAB);  Surgeon: Sherren Mocha, MD;  Location: [place] [place] [facility];  Service: Cardiovascular;  Laterality: N/A;   HEMORROIDECTOMY     KIDNEY STONE SURGERY     Basket    Current Medications: No outpatient medications have been marked as taking for the 07/07/21 encounter (Appointment) with Freada Bergeron, MD.     Allergies:   Shellfish allergy, Tape, Sulfa antibiotics, and Trazodone and nefazodone   Social History   Socioeconomic History   Marital status: Married    Spouse name: Not on file   Number of children: 4   Years of education: Not on file   Highest education level: Not on file  Occupational History   Occupation: retired  Tobacco Use   Smoking status: Former   Smokeless tobacco: Former    Types: Nurse, children's Use: Never used  Substance and Sexual Activity   Alcohol use: Never   Drug use: Never   Sexual activity: Not on file  Other Topics Concern   Not on file  Social History Narrative   Not on file   Social Determinants of Health   Financial Resource Strain: Not on file  Food Insecurity: Not on file  Transportation Needs: Not on file  Physical Activity: Not on file  Stress: Not on file  Social Connections: Not on file     Family History: The patient's ***family history includes Atrial fibrillation in his brother; Breast cancer in his daughter; CVA in his father; Dementia in his sister and sister; Kidney cancer in his daughter; Other in his mother; Pancreatic cancer in his brother; Stomach cancer in his brother.  ROS:   Please see the history of present illness.    *** All other systems reviewed and are negative.  EKGs/Labs/Other Studies Reviewed:    The following studies were reviewed today: Cardiac Monitor 01/2020: Predominant rhythm was sinus rhythm.  Atrial fibrillation also noted, rapid response 89%.  PVCs and PACs noted.   PVC burden 4%.  TTE 12/2018: IMPRESSIONS     1. Left ventricular ejection fraction, by visual estimation, is 65 to  70%. The left ventricle has hyperdynamic function. There is no left  ventricular hypertrophy.   2. Left ventricular diastolic function could not be evaluated.   3. The left ventricle has no regional wall motion abnormalities.   4. Global right ventricle has normal systolic function.The right  ventricular size is normal. No increase in right ventricular wall  thickness.   5. Left atrial size was normal.   6. Right atrial size was mildly dilated.   7. The mitral valve is normal in structure. Trivial mitral valve  regurgitation. No evidence of mitral stenosis.   8. The tricuspid valve is normal in structure.   9. The aortic valve is normal in structure. Aortic valve regurgitation is  not visualized. Mild to moderate aortic valve sclerosis/calcification  without any evidence of aortic stenosis.  10. The pulmonic valve was normal in structure. Pulmonic valve  regurgitation is not visualized.  11. Moderately elevated pulmonary artery systolic pressure.  12. The tricuspid regurgitant velocity is 2.91 m/s, and with an assumed  right atrial pressure of 8 mmHg, the estimated right ventricular systolic  pressure is moderately elevated at 42.0 mmHg.  13. The inferior vena cava is normal in size with greater than 50%  respiratory variability, suggesting right atrial pressure of 3 mmHg.  14. Since the last study on 10/01/2017 LVEF has improved from 45-50% to  65-70%.   Cath 10/2017: Ost 2nd Mrg to 2nd Mrg lesion is 30% stenosed. Prox LAD to Mid LAD lesion is 100% stenosed.   1.  Total occlusion of the LAD just after the first diagonal branch 2.  Minor nonobstructive disease involving the left main, left circumflex, and RCA   Recommendation: Consider CTO-PCI of the LAD.  There is a well-formed epicardial collateral from the obtuse marginal branch to the LAD and the length of the  total occlusion appears to be fairly short.  Will refer to Dr Irish Lack or [place].   Resume apixaban tomorrow.    Coronary CTA 10/01/17: FINDINGS: Non-cardiac: See separate report from [facility].   Pulmonary veins drain normally to the left atrium.   Calcium Score: 284 Agatston units.   Coronary Arteries: Right dominant with no anomalies   LM: Calcified plaque distal left main, no significant stenosis.   LAD system: The proximal LAD is occluded after 2 small caliber diagonals. There is some filling of the distal LAD by collaterals.   Circumflex system: Mixed plaque proximal LCx, no signiificant stenosis. Mixed plaque proximal OM1, mild stenosis.   RCA system: Dominant but relatively small caliber. Calcified plaque distal RCA, mild stenosis.   IMPRESSION: 1. Coronary  artery calcium score 284 Agatston units, placing the patient in the 35th percentile for age and gender.   2. The proximal LAD is occluded after 2 small diagonals. There is some filling of the distal LAD by collaterals.   TTE 10/01/17: Study Conclusions   - Left ventricle: The cavity size was normal. There was mild    concentric hypertrophy. Systolic function was mildly reduced. The    estimated ejection fraction was in the range of 45% to 50%.    Diffuse hypokinesis.  - Aortic valve: Transvalvular velocity was within the normal range.    There was no stenosis. There was no regurgitation.  - Mitral valve: Moderately calcified annulus. Calcification.    Transvalvular velocity was within the normal range. There was no    evidence for stenosis. There was trivial regurgitation.  - Left atrium: The atrium was moderately dilated.  - Right ventricle: The cavity size was normal. Wall thickness was    normal. Systolic function was normal.  - Tricuspid valve: There was mild regurgitation.  - Pulmonary arteries: Systolic pressure was within the normal    range. PA peak pressure: 18 mm Hg (S).   EKG:  EKG  is *** ordered today.  The ekg ordered today demonstrates ***  Recent Labs: 09/16/2020: Hemoglobin 11.1; Platelets 158 03/31/2021: BUN 16; Creatinine, Ser 1.51; Magnesium 2.1; Potassium 3.9; Sodium 140  Recent Lipid Panel No results found for: CHOL, TRIG, HDL, CHOLHDL, VLDL, LDLCALC, LDLDIRECT   Risk Assessment/Calculations:   {Does this patient have ATRIAL FIBRILLATION?[phone]}       Physical Exam:    VS:  There were no vitals taken for this visit.    Wt Readings from Last 3 Encounters:  03/31/21 194 lb 12.8 oz (88.4 kg)  09/16/20 187 lb 3.2 oz (84.9 kg)  02/26/20 187 lb (84.8 kg)     GEN: *** Well nourished, well developed in no acute distress HEENT: Normal NECK: No JVD; No carotid bruits LYMPHATICS: No lymphadenopathy CARDIAC: ***RRR, no murmurs, rubs, gallops RESPIRATORY:  Clear to auscultation without rales, wheezing or rhonchi  ABDOMEN: Soft, non-tender, non-distended MUSCULOSKELETAL:  No edema; No deformity  SKIN: Warm and dry NEUROLOGIC:  Alert and oriented x 3 PSYCHIATRIC:  Normal affect   ASSESSMENT:    No diagnosis found. PLAN:    In order of problems listed above:  #Acute on Chronic HF with Improved EF: Patient with history of mildly reduced LVEF on TTE in 2019 at 45-50%. Improved to 65-70% in 2020. Currently with NYHA class III symptoms with evidence of volume overload on exam in the setting of diuretic noncompliance.  -Repeat TTE -? Increase torsemide -Check BNP -Continue metop 25mg  XL daily  #CAD: Cath 2019 with CTO of prox LAD with filling of the distal LAD with collaterals. Has been managed medically. Currently without anginal symptoms.  -Continue ASA 81mg  daily -Start crestor ?? -Continue metop 25mg  XL daily -Continue imdur 30mg  daily  #Persistent Afib: CHADs-vasc ***. Not on AC due to frequent falls (recently stopped). Currently maintaining NSR on dofetilide. Followed by Dr. Curt Bears -Continue dofetilide 27mcg daily -Off apixaban due to  frequent falls  #HTN: -Continue metop 25mg  XL daily as above -Continue imdur 30mg  daily     {Are you ordering a CV Procedure (e.g. stress test, cath, DCCV, TEE, etc)?   Press F2        :[id number]    Medication Adjustments/Labs and Tests Ordered: Current medicines are reviewed at length with the patient today.  Concerns regarding medicines  are outlined above.  No orders of the defined types were placed in this encounter.  No orders of the defined types were placed in this encounter.   There are no Patient Instructions on file for this visit.   Signed, Freada Bergeron, MD  07/02/2021 4:25 PM    [facility]

## 2021-07-05 ENCOUNTER — Encounter: Payer: Self-pay | Admitting: Cardiology

## 2021-07-07 ENCOUNTER — Encounter: Payer: Self-pay | Admitting: Cardiology

## 2021-07-07 ENCOUNTER — Ambulatory Visit (INDEPENDENT_AMBULATORY_CARE_PROVIDER_SITE_OTHER): Payer: Medicare Other | Admitting: Cardiology

## 2021-07-07 VITALS — BP 110/60 | HR 55 | Ht 66.0 in | Wt 189.4 lb

## 2021-07-07 DIAGNOSIS — I5032 Chronic diastolic (congestive) heart failure: Secondary | ICD-10-CM | POA: Diagnosis not present

## 2021-07-07 DIAGNOSIS — I10 Essential (primary) hypertension: Secondary | ICD-10-CM | POA: Diagnosis not present

## 2021-07-07 DIAGNOSIS — I4819 Other persistent atrial fibrillation: Secondary | ICD-10-CM

## 2021-07-07 DIAGNOSIS — D6869 Other thrombophilia: Secondary | ICD-10-CM

## 2021-07-07 NOTE — Patient Instructions (Signed)

## 2021-07-07 NOTE — Progress Notes (Signed)
Cardiology Office Note:    Date:  07/07/2021   ID:  Ronnie Torres, DOB 04-Nov-1929, MRN [id number]  PCP:  Redmond Pulling, MD   [facility] Providers Cardiologist:  None Electrophysiologist:  Will Meredith Leeds, MD  {  Referring MD: Constance Haw, MD    History of Present Illness:    Ronnie Torres is a 88 y.o. male with a hx of CAD with CTO of LAD managed medically, chronic diastolic HF, Afib on dofetilide, COPD, and GERD who was referred by Dr. Curt Bears for further evaluation of HFpEF.  Per review of the record, the patient has a history of CAD with coronary CTA in 2019 revealing occlusion of prox LAD after 2 small diagnals. The distal LAD filled by collaterals. Mild distal RCA stenosis. Mild stenosis of OM1. Follow-up cath in 10/2017 confirmed CTO of prox LAD. He was managed medically.  Also has a history of mildly reduced LVEF 45-50% in 2019 that improved to 65-70% on TTE in 2020 as well as a history of persistent Afib for which he follows with Dr. Curt Bears.  Last saw Dr. Curt Bears in 03/2021 where he was suffering from SOB and LE edema in the setting of not taking his torsemide or metolazone. He also was having more frequent falls. He has since been switched off apixaban to ASA given frequent falls.   Today, he is accompanied by his daughter, who provides the majority of the history as he is hard of hearing.   They note that the status of his LE swelling today is the best it has been in a while. He has suffered from chronic LE edema that they attribute to a high sodium intake in his diet over years. He has been taking his torsemide and metolazone more regularly and this has helped.  His daughter reports that he is mostly compliant with his medications. He has occasional chest pain but this is overall stable. No SOB, palpitations, syncope or orthopnea.  Recently he had one dizzy episode, improved after taking meclizine.  Sometimes he complains of pains in his chest, but his  daughter is not sure if it is related to his heart or COPD.  No recent falls. Has been on ASA therapy.  He also endorses significant arthritic pain. He is taking 100 mg allopurinol daily, but his daughter is not sure it is helping.  He denies any palpitations, or shortness of breath. No headaches, syncope, orthopnea, or PND.   Past Medical History:  Diagnosis Date   Acid reflux    Anemia    Arthritis    Atrial fibrillation (HCC)    BPH (benign prostatic hyperplasia)    CAD (coronary artery disease)    Colon polyps    COPD (chronic obstructive pulmonary disease) (HCC)    COVID-19    Diverticulitis    Diverticulosis    Emphysema lung (HCC)    GERD (gastroesophageal reflux disease)    Gout    Hemorrhoids    Hiatal hernia    Sleep apnea    CPAP   Status post dilation of esophageal narrowing     Past Surgical History:  Procedure Laterality Date   CARDIOVERSION N/A 12/01/2017   Procedure: CARDIOVERSION;  Surgeon: Jerline Pain, MD;  Location: [place];  Service: Cardiovascular;  Laterality: N/A;   CORONARY ANGIOGRAPHY N/A 11/02/2017   Procedure: CORONARY ANGIOGRAPHY (CATH LAB);  Surgeon: Sherren Mocha, MD;  Location: [place] CV LAB;  Service: Cardiovascular;  Laterality: N/A;   HEMORROIDECTOMY  KIDNEY STONE SURGERY     Basket    Current Medications: Current Meds  Medication Sig   acetaminophen (TYLENOL) 500 MG tablet Take 1,000 mg by mouth every 6 (six) hours as needed for moderate pain.   albuterol (PROVENTIL HFA;VENTOLIN HFA) 108 (90 Base) MCG/ACT inhaler Inhale 2 puffs into the lungs every 4 (four) hours as needed for wheezing or shortness of breath.    allopurinol (ZYLOPRIM) 100 MG tablet Take 100 mg by mouth daily.   aspirin EC (ASPIRIN ADULT LOW STRENGTH) 81 MG tablet Take 81 mg by mouth daily.   budesonide-formoterol (SYMBICORT) 80-4.5 MCG/ACT inhaler Inhale 2 puffs into the lungs as directed.   colchicine 0.6 MG tablet Take 0.6 mg by mouth daily as  needed.   dofetilide (TIKOSYN) 250 MCG capsule Take 1 capsule (250 mcg total) by mouth 2 (two) times daily.   ferrous sulfate 325 (65 FE) MG tablet Take 648 mg by mouth every Monday, Wednesday, and Friday.   gabapentin (NEURONTIN) 100 MG capsule Take 100 mg by mouth 2 (two) times daily.    guaiFENesin (MUCINEX) 600 MG 12 hr tablet Take 600 mg by mouth as needed.   isosorbide mononitrate (IMDUR) 30 MG 24 hr tablet Take 1/2 (one-half) tablet by mouth once daily   Menthol, Topical Analgesic, 8 % LIQD Apply 1 application topically 2 (two) times daily as needed (joint pain).   metolazone (ZAROXOLYN) 2.5 MG tablet TAKE ONE TABLET BY MOUTH EVERY SATURDAY   metoprolol succinate (TOPROL XL) 25 MG 24 hr tablet Take 1 tablet (25 mg total) by mouth daily.   omeprazole (PRILOSEC) 20 MG capsule Take 20 mg by mouth 2 (two) times daily.    polyethylene glycol (MIRALAX / GLYCOLAX) 17 g packet Take 17 g by mouth daily as needed for moderate constipation.   potassium chloride SA (KLOR-CON M) 20 MEQ tablet Take 3 tablets (60 mEq total) by mouth daily.   senna-docusate (SENOKOT-S) 8.6-50 MG tablet Take 1 tablet by mouth at bedtime as needed for mild constipation.   sertraline (ZOLOFT) 50 MG tablet Take 50 mg by mouth at bedtime.   tamsulosin (FLOMAX) 0.4 MG CAPS capsule Take 0.4 mg by mouth at bedtime.   torsemide (DEMADEX) 20 MG tablet Take 2 tablets (40 mg total) by mouth daily.   vitamin B-12 (CYANOCOBALAMIN) 1000 MCG tablet Take 1,000 mcg by mouth every Monday, Wednesday, and Friday.   [DISCONTINUED] ferrous sulfate 324 (65 Fe) MG TBEC Take 648 mg by mouth daily.     Allergies:   Shellfish allergy, Tape, Sulfa antibiotics, and Trazodone and nefazodone   Social History   Socioeconomic History   Marital status: Married    Spouse name: Not on file   Number of children: 4   Years of education: Not on file   Highest education level: Not on file  Occupational History   Occupation: retired  Tobacco Use    Smoking status: Former   Smokeless tobacco: Former    Types: Nurse, children's Use: Never used  Substance and Sexual Activity   Alcohol use: Never   Drug use: Never   Sexual activity: Not on file  Other Topics Concern   Not on file  Social History Narrative   Not on file   Social Determinants of Health   Financial Resource Strain: Not on file  Food Insecurity: Not on file  Transportation Needs: Not on file  Physical Activity: Not on file  Stress: Not on file  Social  Connections: Not on file     Family History: The patient's family history includes Atrial fibrillation in his brother; Breast cancer in his daughter; CVA in his father; Dementia in his sister and sister; Kidney cancer in his daughter; Other in his mother; Pancreatic cancer in his brother; Stomach cancer in his brother.  ROS:   Review of Systems  Constitutional:  Negative for chills and fever.  HENT:  Negative for ear pain and sinus pain.   Eyes:  Negative for photophobia.  Respiratory:  Negative for cough and sputum production.   Cardiovascular:  Positive for chest pain. Negative for palpitations, orthopnea, claudication, leg swelling and PND.  Gastrointestinal:  Negative for constipation and nausea.  Genitourinary:  Negative for dysuria.  Musculoskeletal:  Positive for falls and joint pain. Negative for neck pain.  Neurological:  Positive for dizziness. Negative for loss of consciousness and weakness.  Endo/Heme/Allergies:  Negative for polydipsia.  Psychiatric/Behavioral:  Negative for depression and suicidal ideas.      EKGs/Labs/Other Studies Reviewed:    The following studies were reviewed today:  Cardiac Monitor 01/2020: Predominant rhythm was sinus rhythm.  Atrial fibrillation also noted, rapid response 89%.  PVCs and PACs noted.  PVC burden 4%.  TTE 12/2018: IMPRESSIONS    1. Left ventricular ejection fraction, by visual estimation, is 65 to  70%. The left ventricle has hyperdynamic  function. There is no left  ventricular hypertrophy.   2. Left ventricular diastolic function could not be evaluated.   3. The left ventricle has no regional wall motion abnormalities.   4. Global right ventricle has normal systolic function.The right  ventricular size is normal. No increase in right ventricular wall  thickness.   5. Left atrial size was normal.   6. Right atrial size was mildly dilated.   7. The mitral valve is normal in structure. Trivial mitral valve  regurgitation. No evidence of mitral stenosis.   8. The tricuspid valve is normal in structure.   9. The aortic valve is normal in structure. Aortic valve regurgitation is  not visualized. Mild to moderate aortic valve sclerosis/calcification  without any evidence of aortic stenosis.  10. The pulmonic valve was normal in structure. Pulmonic valve  regurgitation is not visualized.  11. Moderately elevated pulmonary artery systolic pressure.  12. The tricuspid regurgitant velocity is 2.91 m/s, and with an assumed  right atrial pressure of 8 mmHg, the estimated right ventricular systolic  pressure is moderately elevated at 42.0 mmHg.  13. The inferior vena cava is normal in size with greater than 50%  respiratory variability, suggesting right atrial pressure of 3 mmHg.  14. Since the last study on 10/01/2017 LVEF has improved from 45-50% to  65-70%.   Cath 10/2017: Ost 2nd Mrg to 2nd Mrg lesion is 30% stenosed. Prox LAD to Mid LAD lesion is 100% stenosed.   1.  Total occlusion of the LAD just after the first diagonal branch 2.  Minor nonobstructive disease involving the left main, left circumflex, and RCA   Recommendation: Consider CTO-PCI of the LAD.  There is a well-formed epicardial collateral from the obtuse marginal branch to the LAD and the length of the total occlusion appears to be fairly short.  Will refer to Dr Irish Lack or [place].   Resume apixaban tomorrow.    Coronary CTA  10/01/17: FINDINGS: Non-cardiac: See separate report from [facility].   Pulmonary veins drain normally to the left atrium.   Calcium Score: 284 Agatston units.   Coronary Arteries:  Right dominant with no anomalies   LM: Calcified plaque distal left main, no significant stenosis.   LAD system: The proximal LAD is occluded after 2 small caliber diagonals. There is some filling of the distal LAD by collaterals.   Circumflex system: Mixed plaque proximal LCx, no signiificant stenosis. Mixed plaque proximal OM1, mild stenosis.   RCA system: Dominant but relatively small caliber. Calcified plaque distal RCA, mild stenosis.   IMPRESSION: 1. Coronary artery calcium score 284 Agatston units, placing the patient in the 35th percentile for age and gender.   2. The proximal LAD is occluded after 2 small diagonals. There is some filling of the distal LAD by collaterals.   TTE 10/01/17: Study Conclusions   - Left ventricle: The cavity size was normal. There was mild    concentric hypertrophy. Systolic function was mildly reduced. The    estimated ejection fraction was in the range of 45% to 50%.    Diffuse hypokinesis.  - Aortic valve: Transvalvular velocity was within the normal range.    There was no stenosis. There was no regurgitation.  - Mitral valve: Moderately calcified annulus. Calcification.    Transvalvular velocity was within the normal range. There was no    evidence for stenosis. There was trivial regurgitation.  - Left atrium: The atrium was moderately dilated.  - Right ventricle: The cavity size was normal. Wall thickness was    normal. Systolic function was normal.  - Tricuspid valve: There was mild regurgitation.  - Pulmonary arteries: Systolic pressure was within the normal    range. PA peak pressure: 18 mm Hg (S).   EKG:  EKG is personally reviewed. 07/07/2021:  EKG was not ordered. 03/31/2021 (Dr. Curt Bears): Sinus rhythm.  Recent Labs: 09/16/2020:  Hemoglobin 11.1; Platelets 158 03/31/2021: BUN 16; Creatinine, Ser 1.51; Magnesium 2.1; Potassium 3.9; Sodium 140   Recent Lipid Panel No results found for: "CHOL", "TRIG", "HDL", "CHOLHDL", "VLDL", "LDLCALC", "LDLDIRECT"   Risk Assessment/Calculations:    CHA2DS2-VASc Score = 5  This indicates a 7.2% annual risk of stroke. The patient's score is based upon: CHF History: 1 HTN History: 1 Diabetes History: 0 Stroke History: 0 Vascular Disease History: 1 Age Score: 2 Gender Score: 0       Physical Exam:    VS:  BP 110/60   Pulse (!) 55   Ht 5\' 6"  (1.676 m)   Wt 189 lb 6.4 oz (85.9 kg)   SpO2 96%   BMI 30.57 kg/m     Wt Readings from Last 3 Encounters:  07/07/21 189 lb 6.4 oz (85.9 kg)  03/31/21 194 lb 12.8 oz (88.4 kg)  09/16/20 187 lb 3.2 oz (84.9 kg)     GEN: Well nourished, well developed in no acute distress HEENT: Normal NECK: No JVD; No carotid bruits LYMPHATICS: No lymphadenopathy CARDIAC: Bradycardic, regular ,1/6 soft systolic murmur, No rubs, No gallops RESPIRATORY:  Clear to auscultation without rales, wheezing or rhonchi  ABDOMEN: Soft, non-tender, non-distended MUSCULOSKELETAL:  No edema; No deformity  SKIN: Warm and dry NEUROLOGIC:  Alert and oriented x 3 PSYCHIATRIC:  Normal affect   ASSESSMENT:    1. Chronic diastolic heart failure (HCC)   2. Persistent atrial fibrillation ([place])   3. Secondary hypercoagulable state ([place])   4. Primary hypertension    PLAN:    In order of problems listed above:  #Chronic HF with Improved EF: Patient with history of mildly reduced LVEF on TTE in 2019 at 45-50%. Improved to 65-70% in 2020. Currently  with NYHA class II symptoms with improved volume status in the setting of better medication compliance. -Continue torsemide 40mg  daily -Continue metolazone 2.5mg  on Sat -On Potassium supplementation 66mEq daily -Continue metop 25mg  XL daily -Admits to eating significant sodium  -Monitor weights and LE  edema  #CAD: Cath 2019 with CTO of prox LAD with filling of the distal LAD with collaterals. Has been managed medically. Has occasional chest pain but this is overall stable. Given age and overall stability, no plans for further ischemic evaluation at this time. -Continue ASA 81mg  daily -Continue metop 25mg  XL daily -Continue imdur 30mg  daily  #Persistent Afib: CHADs-vasc 5. Not on AC due to frequent falls (recently stopped). Currently maintaining NSR on dofetilide. Followed by Dr. Curt Bears -Continue dofetilide 262mcg daily -Off apixaban due to frequent falls -Continue ASA 81mg  daily  #HTN: Well controlled.  -Continue metop 25mg  XL daily as above -Continue imdur 30mg  daily        Follow-up:  6 months.  Medication Adjustments/Labs and Tests Ordered: Current medicines are reviewed at length with the patient today.  Concerns regarding medicines are outlined above.   No orders of the defined types were placed in this encounter.  No orders of the defined types were placed in this encounter.  Patient Instructions  Medication Instructions:   Your physician recommends that you continue on your current medications as directed. Please refer to the Current Medication list given to you today.  *If you need a refill on your cardiac medications before your next appointment, please call your pharmacy*   Follow-Up: At [facility], you and your health needs are our priority.  As part of our continuing mission to provide you with exceptional heart care, we have created designated Provider Care Teams.  These Care Teams include your primary Cardiologist (physician) and Advanced Practice Providers (APPs -  Physician Assistants and Nurse Practitioners) who all work together to provide you with the care you need, when you need it.  We recommend signing up for the patient portal called "MyChart".  Sign up information is provided on this After Visit Summary.  MyChart is used to connect with  patients for Virtual Visits (Telemedicine).  Patients are able to view lab/test results, encounter notes, upcoming appointments, etc.  Non-urgent messages can be sent to your provider as well.   To learn more about what you can do with MyChart, go to [email or web address].    Your next appointment:   6 month(s)  The format for your next appointment:   In Person  Provider:   Dr. Johney Frame   Important Information About Sugar         I,Mathew Stumpf,acting as a scribe for Freada Bergeron, MD.,have documented all relevant documentation on the behalf of Freada Bergeron, MD,as directed by  Freada Bergeron, MD while in the presence of Freada Bergeron, MD.  I, Freada Bergeron, MD, have reviewed all documentation for this visit. The documentation on 07/07/21 for the exam, diagnosis, procedures, and orders are all accurate and complete.   Signed, Freada Bergeron, MD  07/07/2021 11:42 AM    [place]

## 2021-08-06 ENCOUNTER — Encounter: Payer: Self-pay | Admitting: Cardiology

## 2021-08-22 ENCOUNTER — Other Ambulatory Visit: Payer: Self-pay | Admitting: Cardiology

## 2021-09-01 ENCOUNTER — Encounter: Payer: Self-pay | Admitting: Cardiology

## 2021-09-02 ENCOUNTER — Telehealth: Payer: Self-pay

## 2021-09-02 NOTE — Telephone Encounter (Signed)
**Note De-Identified Ronnie Torres Obfuscation** Per the pts daughter and DPR, Rush Barer message I called [employer] at [phone]) and ordered the pts next Tikosyn shipment with [facility]. Pt ID #: [id number]  Per Savey the pts Tikosyn shipment will arrive at the office within 7 to 10 business days from today and that he has 1 more fill remaining before 01/25/2022. His next fill date is 11/09/2021.   This order ID # is: [id number]   I have sent the pt and his daughter, Ronnie Torres a TYOMAYO message advising that I have ordered his next Tikosyn shipment through [employer].

## 2021-09-09 NOTE — Telephone Encounter (Signed)
Called pt and spoke with his wife Tyler Aas, to inform them that pt's medication 3 bottles of Tikosyn 250 mcg, 60 tablets per bottle, were at [facility] [address] for pt to pick up. I advised the wife that if pt has any other problems, questions or concerns, to give our office a call. Wife verbalized understanding.   Lot:  [id number]   Exp: 02/2023  XJO

## 2021-09-17 ENCOUNTER — Other Ambulatory Visit: Payer: Self-pay | Admitting: Cardiology

## 2021-10-13 ENCOUNTER — Ambulatory Visit: Payer: Medicare Other | Admitting: Cardiology

## 2021-10-21 ENCOUNTER — Other Ambulatory Visit: Payer: Self-pay | Admitting: Cardiology

## 2021-10-21 MED ORDER — POTASSIUM CHLORIDE CRYS ER 20 MEQ PO TBCR: 60.0000 meq | EXTENDED_RELEASE_TABLET | Freq: Every day | ORAL | 1 refills | Status: DC

## 2021-11-03 ENCOUNTER — Ambulatory Visit: Payer: Medicare Other | Admitting: Cardiology

## 2021-11-14 ENCOUNTER — Encounter: Payer: Self-pay | Admitting: Cardiology

## 2021-12-05 ENCOUNTER — Other Ambulatory Visit: Payer: Self-pay

## 2021-12-08 ENCOUNTER — Encounter: Payer: Self-pay | Admitting: Cardiology

## 2021-12-08 ENCOUNTER — Ambulatory Visit: Payer: Medicare Other | Attending: Cardiology | Admitting: Cardiology

## 2021-12-08 VITALS — BP 122/68 | HR 60 | Ht 66.0 in | Wt 189.2 lb

## 2021-12-08 DIAGNOSIS — I4819 Other persistent atrial fibrillation: Secondary | ICD-10-CM | POA: Diagnosis not present

## 2021-12-08 DIAGNOSIS — D6869 Other thrombophilia: Secondary | ICD-10-CM

## 2021-12-08 DIAGNOSIS — I2510 Atherosclerotic heart disease of native coronary artery without angina pectoris: Secondary | ICD-10-CM

## 2021-12-08 DIAGNOSIS — Z79899 Other long term (current) drug therapy: Secondary | ICD-10-CM | POA: Diagnosis not present

## 2021-12-08 LAB — MAGNESIUM: Magnesium: 2.1 mg/dL (ref 1.6–2.3)

## 2021-12-08 NOTE — Patient Instructions (Signed)
Medication Instructions:  Your physician recommends that you continue on your current medications as directed. Please refer to the Current Medication list given to you today.  *If you need a refill on your cardiac medications before your next appointment, please call your pharmacy*   Lab Work: Magnesium level today  If you have labs (blood work) drawn today and your tests are completely normal, you will receive your results only by: MyChart Message (if you have MyChart) OR A paper copy in the mail If you have any lab test that is abnormal or we need to change your treatment, we will call you to review the results.   Testing/Procedures: None ordered   Follow-Up: At [facility], you and your health needs are our priority.  As part of our continuing mission to provide you with exceptional heart care, we have created designated Provider Care Teams.  These Care Teams include your primary Cardiologist (physician) and Advanced Practice Providers (APPs -  Physician Assistants and Nurse Practitioners) who all work together to provide you with the care you need, when you need it.  Your next appointment:   6 month(s)  The format for your next appointment:   In Person  Provider:   Loman Brooklyn, MD    Thank you for choosing [facility]!!   Dory Horn, RN [phone]  Other Instructions   Important Information About Sugar

## 2021-12-08 NOTE — Progress Notes (Signed)
Electrophysiology Office Note   Date:  12/08/2021   ID:  [name], DOB 20-Dec-1929, MRN [id number]  PCP:  Jim Like, MD  Cardiologist:   Primary Electrophysiologist:  Jenny Lai Jorja Loa, MD    No chief complaint on file.     History of Present Illness: Ronnie Torres is a 88 y.o. male who is being seen today for the evaluation of atrial fibrillation at the request of Atha Starks. Presenting today for electrophysiology evaluation.    He has a history significant persistent atrial fibrillation, COPD, BPH, coronary artery disease.  He was initially on amiodarone but did not stay in rhythm.  He switched to dofetilide.  He had a left heart catheterization that showed occluded LAD and no intervention was performed.  Today, denies symptoms of palpitations, chest pain, shortness of breath, orthopnea, PND, lower extremity edema, claudication, dizziness, presyncope, syncope, bleeding, or neurologic sequela. The patient is tolerating medications without difficulties.  Main issue is that he has been having falls.  He has not had any syncope, but simply either loses his balance, or has coughing fits when eating or taking medications.  He is currently off of his anticoagulation.  He has had no chest pain.   Past Medical History:  Diagnosis Date   Acid reflux    Anemia    Arthritis    Atrial fibrillation (HCC)    BPH (benign prostatic hyperplasia)    CAD (coronary artery disease)    Colon polyps    COPD (chronic obstructive pulmonary disease) (HCC)    COVID-19    Diverticulitis    Diverticulosis    Emphysema lung (HCC)    GERD (gastroesophageal reflux disease)    Gout    Hemorrhoids    Hiatal hernia    Sleep apnea    CPAP   Status post dilation of esophageal narrowing    Past Surgical History:  Procedure Laterality Date   CARDIOVERSION N/A 12/01/2017   Procedure: CARDIOVERSION;  Surgeon: Jake Bathe, MD;  Location: [facility]OS[facility];  Service: Cardiovascular;  Laterality: N/A;    CORONARY ANGIOGRAPHY N/A 11/02/2017   Procedure: CORONARY ANGIOGRAPHY (CATH LAB);  Surgeon: Tonny Bollman, MD;  Location: [facility] INVASIVE CV LAB;  Service: Cardiovascular;  Laterality: N/A;   HEMORROIDECTOMY     KIDNEY STONE SURGERY     Basket     Current Outpatient Medications  Medication Sig Dispense Refill   acetaminophen (TYLENOL) 500 MG tablet Take 1,000 mg by mouth every 6 (six) hours as needed for moderate pain.     albuterol (PROVENTIL HFA;VENTOLIN HFA) 108 (90 Base) MCG/ACT inhaler Inhale 2 puffs into the lungs every 4 (four) hours as needed for wheezing or shortness of breath.      allopurinol (ZYLOPRIM) 100 MG tablet Take 100 mg by mouth 2 (two) times daily.     aspirin EC (ASPIRIN ADULT LOW STRENGTH) 81 MG tablet Take 81 mg by mouth daily.     budesonide-formoterol (SYMBICORT) 80-4.5 MCG/ACT inhaler Inhale 2 puffs into the lungs as directed.     colchicine 0.6 MG tablet Take 0.6 mg by mouth daily as needed.     dofetilide (TIKOSYN) 250 MCG capsule Take 1 capsule (250 mcg total) by mouth 2 (two) times daily. 180 capsule 2   donepezil (ARICEPT) 5 MG tablet Take 5 mg by mouth at bedtime.     ferrous sulfate 325 (65 FE) MG tablet Take 648 mg by mouth every Monday, Wednesday, and Friday.     gabapentin (NEURONTIN) 100 MG  capsule Take 100 mg by mouth 2 (two) times daily.      guaiFENesin (MUCINEX) 600 MG 12 hr tablet Take 600 mg by mouth as needed.     isosorbide mononitrate (IMDUR) 30 MG 24 hr tablet Take 1/2 (one-half) tablet by mouth once daily 45 tablet 2   Menthol, Topical Analgesic, 8 % LIQD Apply 1 application topically 2 (two) times daily as needed (joint pain).     metolazone (ZAROXOLYN) 2.5 MG tablet TAKE 1 TABLET EVERY SATURDAY 12 tablet 2   metoprolol succinate (TOPROL XL) 25 MG 24 hr tablet Take 1 tablet (25 mg total) by mouth daily. 90 tablet 3   omeprazole (PRILOSEC) 20 MG capsule Take 20 mg by mouth 2 (two) times daily.      polyethylene glycol (MIRALAX / GLYCOLAX) 17  g packet Take 17 g by mouth daily as needed for moderate constipation.     potassium chloride SA (KLOR-CON M) 20 MEQ tablet Take 3 tablets (60 mEq total) by mouth daily. 270 tablet 1   senna-docusate (SENOKOT-S) 8.6-50 MG tablet Take 1 tablet by mouth at bedtime as needed for mild constipation.     sertraline (ZOLOFT) 50 MG tablet Take 50 mg by mouth at bedtime.     tamsulosin (FLOMAX) 0.4 MG CAPS capsule Take 0.4 mg by mouth at bedtime.     torsemide (DEMADEX) 20 MG tablet TAKE 2 TABLETS DAILY 180 tablet 2   vitamin B-12 (CYANOCOBALAMIN) 1000 MCG tablet Take 1,000 mcg by mouth every Monday, Wednesday, and Friday.     No current facility-administered medications for this visit.    Allergies:   Shellfish allergy, Tape, Sulfa antibiotics, and Trazodone and nefazodone   Social History:  The patient  reports that he has quit smoking. He has quit using smokeless tobacco.  His smokeless tobacco use included chew. He reports that he does not drink alcohol and does not use drugs.   Family History:  The patient's family history includes Atrial fibrillation in his brother; Breast cancer in his daughter; CVA in his father; Dementia in his sister and sister; Kidney cancer in his daughter; Other in his mother; Pancreatic cancer in his brother; Stomach cancer in his brother.   ROS:  Please see the history of present illness.   Otherwise, review of systems is positive for none.   All other systems are reviewed and negative.   PHYSICAL EXAM: VS:  BP 122/68   Pulse 60   Ht 5\' 6"  (1.676 m)   Wt 189 lb 3.2 oz (85.8 kg)   SpO2 95%   BMI 30.54 kg/m  , BMI Body mass index is 30.54 kg/m. GEN: Well nourished, well developed, in no acute distress  HEENT: normal  Neck: no JVD, carotid bruits, or masses Cardiac: RRR; no murmurs, rubs, or gallops,no edema  Respiratory:  clear to auscultation bilaterally, normal work of breathing GI: soft, nontender, nondistended, + BS MS: no deformity or atrophy  Skin: warm  and dry Neuro:  Strength and sensation are intact Psych: euthymic mood, full affect  EKG:  EKG is ordered today. Personal review of the ekg ordered shows sinus rhythm   Recent Labs: 03/31/2021: BUN 16; Creatinine, Ser 1.51; Magnesium 2.1; Potassium 3.9; Sodium 140    Lipid Panel  No results found for: "CHOL", "TRIG", "HDL", "CHOLHDL", "VLDL", "LDLCALC", "LDLDIRECT"   Wt Readings from Last 3 Encounters:  12/08/21 189 lb 3.2 oz (85.8 kg)  07/07/21 189 lb 6.4 oz (85.9 kg)  03/31/21 194 lb 12.8  oz (88.4 kg)      Other studies Reviewed: Additional studies/ records that were reviewed today include: Myoview 06/29/2017 Review of the above records today demonstrates:  No inducible ischemia No wall motion abnormalities Normal left ventricular function  TTE 10/01/17 - Left ventricle: The cavity size was normal. There was mild   concentric hypertrophy. Systolic function was mildly reduced. The   estimated ejection fraction was in the range of 45% to 50%.   Diffuse hypokinesis. - Aortic valve: Transvalvular velocity was within the normal range.   There was no stenosis. There was no regurgitation. - Mitral valve: Moderately calcified annulus. Calcification.   Transvalvular velocity was within the normal range. There was no   evidence for stenosis. There was trivial regurgitation. - Left atrium: The atrium was moderately dilated. - Right ventricle: The cavity size was normal. Wall thickness was   normal. Systolic function was normal. - Tricuspid valve: There was mild regurgitation. - Pulmonary arteries: Systolic pressure was within the normal   range. PA peak pressure: 18 mm Hg (S).  LHC 11/02/17 Ost 2nd Mrg to 2nd Mrg lesion is 30% stenosed. Prox LAD to Mid LAD lesion is 100% stenosed.   1.  Total occlusion of the LAD just after the first diagonal branch 2.  Minor nonobstructive disease involving the left main, left circumflex, and RCA  Cardiac monitor 02/20/2020 personally  reviewed Predominant rhythm was sinus rhythm.  Atrial fibrillation also noted, rapid response 89%.  PVCs and PACs noted.  PVC burden 4%.    ASSESSMENT AND PLAN:  1.  Persistent atrial fibrillation: Currently on dofetilide 250 mg twice daily.  High risk medication monitoring.  We Iyanah Demont check a magnesium today.  CHA2DS2-VASc of 3.  Not anticoagulated due to frequent falls.  He is remained in sinus rhythm.  Continue with current management.  2.  Coronary artery disease: Has a chronically occluded LAD.  No current chest pain.  Continue metoprolol and Imdur.  3.  Hypercoagulable state: Currently off anticoagulation due to frequent falls for atrial fibrillation as above.  4.  Chronic diastolic heart failure: Has lower extremity edema.  Also has kidney disease.  We Kalup Jaquith hold off on diuretic adjustment as he is not overly symptomatic.    Current medicines are reviewed at length with the patient today.   The patient does not have concerns regarding his medicines.  The following changes were made today: none  Labs/ tests ordered today include:  Orders Placed This Encounter  Procedures   Magnesium   EKG 12-Lead      Disposition:   FU with Edward Trevino 6 months  Signed, Sierra Spargo Jorja Loa, MD  12/08/2021 9:53 AM     [facility] [address] [place] [address] [phone] (office) [phone] (fax)

## 2021-12-10 ENCOUNTER — Telehealth: Payer: Self-pay

## 2021-12-10 NOTE — Telephone Encounter (Signed)
Called pt and spoke with his daughter [name], to inform them that pt's medication 3 bottles of Tikosyn 250 mcg, 60 tablets per bottle, were at [facility] [address] for pt to pick up. I advised the daughter that if pt has any other problems, questions or concerns, to give our office a call. Daughter verbalized understanding.   Lot:  [id number]   Exp:   02/2023  [facility]

## 2021-12-16 ENCOUNTER — Other Ambulatory Visit: Payer: Self-pay | Admitting: Cardiology

## 2021-12-29 ENCOUNTER — Ambulatory Visit: Payer: Medicare Other | Admitting: Cardiology

## 2022-01-30 ENCOUNTER — Telehealth: Payer: Self-pay | Admitting: Cardiology

## 2022-01-30 NOTE — Telephone Encounter (Signed)
Daughter called to say that the pts primary pharmacy to send medication refills is through [facility].  Informed the pts daughter this was already added to his file and I selected this as his preferred pharmacy for refilling meds now.  Daughter verbalized understanding and was gracious for all the assistance provided.

## 2022-01-30 NOTE — Telephone Encounter (Signed)
Daughter called stating pt pharmacy has changed and would like it added to his chart for future refill  [name]

## 2022-02-03 ENCOUNTER — Telehealth: Payer: Self-pay | Admitting: Cardiology

## 2022-02-03 NOTE — Telephone Encounter (Signed)
Patients daughter called and said that patient is scheduled to have a CT scan of kidneys and abdomen done tomorrow at 2 pm and wants to know if he can take a dose of Benedryl 25mg  with steroids while also taking Tikosyn

## 2022-02-04 NOTE — Telephone Encounter (Signed)
It is not typically recommended to take Benadryl with Tikosyn. Will route in Dr Curt Bears as well.

## 2022-02-04 NOTE — Telephone Encounter (Signed)
Spoke to dtr Discussed further w/ Dr. Curt Bears. Advised to take the Prednisone recommended, but do NOT take the Benadryl secondary to [name]. Dtr verbalized understanding and agreeable to plan.

## 2022-02-05 ENCOUNTER — Encounter: Payer: Self-pay | Admitting: Cardiology

## 2022-02-06 ENCOUNTER — Other Ambulatory Visit: Payer: Self-pay | Admitting: Cardiology

## 2022-02-06 MED ORDER — METOLAZONE 2.5 MG PO TABS: ORAL_TABLET | ORAL | 3 refills | Status: DC

## 2022-02-06 MED ORDER — POTASSIUM CHLORIDE CRYS ER 20 MEQ PO TBCR: 60.0000 meq | EXTENDED_RELEASE_TABLET | Freq: Every day | ORAL | 3 refills | Status: DC

## 2022-02-06 MED ORDER — TORSEMIDE 20 MG PO TABS: 40.0000 mg | ORAL_TABLET | Freq: Every day | ORAL | 3 refills | Status: DC

## 2022-02-06 MED ORDER — METOPROLOL SUCCINATE ER 25 MG PO TB24: 25.0000 mg | ORAL_TABLET | Freq: Every day | ORAL | 3 refills | Status: DC

## 2022-02-06 MED ORDER — ISOSORBIDE MONONITRATE ER 30 MG PO TB24: ORAL_TABLET | ORAL | 3 refills | Status: DC

## 2022-02-06 MED ORDER — DOFETILIDE 250 MCG PO CAPS: 250.0000 ug | ORAL_CAPSULE | Freq: Two times a day (BID) | ORAL | 3 refills | Status: DC

## 2022-02-10 ENCOUNTER — Telehealth: Payer: Self-pay

## 2022-02-10 NOTE — Telephone Encounter (Signed)
**Note De-Identified Doralyn Kirkes Obfuscation** The pts completed [employer] Pt Asst application for Tikosyn was left at the office with documents.  I have completed the providers page of his application and have e-mailed all to Dr [employer] nurse so she can obtain his signature, to date it (on the 1st and 2nd page), and to fax all to [facility] at the fax number written on the cover letter included.

## 2022-02-11 NOTE — Telephone Encounter (Signed)
Completed and signed documentation has been faxed to [facility] with received confirmation. 

## 2022-02-12 ENCOUNTER — Telehealth: Payer: Self-pay | Admitting: Cardiology

## 2022-02-12 NOTE — Telephone Encounter (Signed)
Faxed yesterday, confirmation received

## 2022-02-12 NOTE — Progress Notes (Signed)
Cardiology Office Note:    Date:  02/23/2022   ID:  Ronnie Torres, DOB 1929-02-16, MRN [id number]  PCP:  Redmond Pulling, MD   [facility] Providers Cardiologist:  None Electrophysiologist:  Will Meredith Leeds, MD  {  Referring MD: Redmond Pulling, MD    History of Present Illness:    Ronnie Torres is a 89 y.o. male with a hx of CAD with CTO of LAD managed medically, chronic diastolic HF, Afib on dofetilide, COPD, and GERD who presents to clinic for follow-up.  Per review of the record, the patient has a history of CAD with coronary CTA in 2019 revealing occlusion of prox LAD after 2 small diagnals. The distal LAD filled by collaterals. Mild distal RCA stenosis. Mild stenosis of OM1. Follow-up cath in 10/2017 confirmed CTO of prox LAD. He was managed medically.  Also has a history of mildly reduced LVEF 45-50% in 2019 that improved to 65-70% on TTE in 2020 as well as a history of persistent Afib for which he follows with Dr. Curt Bears.  Saw Dr. Curt Bears in 03/2021 where he was suffering from SOB and LE edema in the setting of not taking his torsemide or metolazone. He also was having more frequent falls. He has since been switched off apixaban to ASA given frequent falls.   Was last seen in clinic by Dr. Curt Bears on 11/2021 where he was doing well from a CV standpoint. Continued to have frequent falls. Remained off AC.  Today, the patient is accompanied by his daughter. States he started having hematuria and was seen by Urology. CT scan showed large bilateral stones but likely symptoms were not due to this. 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 He is now planned for surgical removal on 02/2022.   Otherwise, he is doing okay from a CV perspective. No chest pain or significant palpitations. Mainly sedentary at baseline. Breathing is stable. Has chronic LE edema that is stable. Taking medications as prescribed. No anginal symptoms.    Hemoglobin was okay 13.2 on 01/30/22. Cr also stable 1.02.   Past Medical History:  Diagnosis Date   Acid reflux    Anemia    Arthritis    Atrial fibrillation (HCC)    BPH (benign prostatic hyperplasia)    CAD (coronary artery disease)    Colon polyps    COPD (chronic obstructive pulmonary disease) (HCC)    COVID-19    Diverticulitis    Diverticulosis    Emphysema lung (HCC)    GERD (gastroesophageal reflux disease)    Gout    Hemorrhoids    Hiatal hernia    Sleep apnea    CPAP   Status post dilation of esophageal narrowing     Past Surgical History:  Procedure Laterality Date   CARDIOVERSION N/A 12/01/2017   Procedure: CARDIOVERSION;  Surgeon: Jerline Pain, MD;  Location: [place];  Service: Cardiovascular;  Laterality: N/A;   CORONARY ANGIOGRAPHY N/A 11/02/2017   Procedure: CORONARY ANGIOGRAPHY (CATH LAB);  Surgeon: Sherren Mocha, MD;  Location: [place] CV [facility];  Service: Cardiovascular;  Laterality: N/A;   HEMORROIDECTOMY     KIDNEY STONE SURGERY     Basket    Current Medications: Current Meds  Medication Sig   acetaminophen (TYLENOL) 500 MG tablet Take 1,000 mg by mouth every 6 (six) hours as needed for moderate pain.   albuterol (PROVENTIL HFA;VENTOLIN HFA) 108 (90 Base) MCG/ACT inhaler Inhale 2 puffs into the lungs every  4 (four) hours as needed for wheezing or shortness of breath.    allopurinol (ZYLOPRIM) 100 MG tablet Take 100 mg by mouth 2 (two) times daily.   aspirin EC (ASPIRIN ADULT LOW STRENGTH) 81 MG tablet Take 81 mg by mouth daily.   budesonide-formoterol (SYMBICORT) 80-4.5 MCG/ACT inhaler Inhale 2 puffs into the lungs as directed.   colchicine 0.6 MG tablet Take 0.6 mg by mouth daily as needed.   dofetilide (TIKOSYN) 250 MCG capsule Take 1 capsule (250 mcg total) by mouth 2 (two) times daily.   donepezil (ARICEPT) 5 MG tablet Take 5 mg by mouth at bedtime.   ferrous sulfate 325 (65 FE) MG tablet Take 648 mg by mouth every Monday,  Wednesday, and Friday.   gabapentin (NEURONTIN) 100 MG capsule Take 100 mg by mouth 2 (two) times daily.    guaiFENesin (MUCINEX) 600 MG 12 hr tablet Take 600 mg by mouth as needed.   isosorbide mononitrate (IMDUR) 30 MG 24 hr tablet TAKE ONE-HALF (1/2) TABLET DAILY   Menthol, Topical Analgesic, 8 % LIQD Apply 1 application topically 2 (two) times daily as needed (joint pain).   metolazone (ZAROXOLYN) 2.5 MG tablet TAKE 1 TABLET EVERY SATURDAY   metoprolol succinate (TOPROL XL) 25 MG 24 hr tablet Take 1 tablet (25 mg total) by mouth daily.   omeprazole (PRILOSEC) 20 MG capsule Take 20 mg by mouth 2 (two) times daily.    polyethylene glycol (MIRALAX / GLYCOLAX) 17 g packet Take 17 g by mouth daily as needed for moderate constipation.   potassium chloride SA (KLOR-CON M) 20 MEQ tablet Take 3 tablets (60 mEq total) by mouth daily.   senna-docusate (SENOKOT-S) 8.6-50 MG tablet Take 1 tablet by mouth at bedtime as needed for mild constipation.   sertraline (ZOLOFT) 50 MG tablet Take 50 mg by mouth at bedtime.   tamsulosin (FLOMAX) 0.4 MG CAPS capsule Take 0.4 mg by mouth at bedtime.   torsemide (DEMADEX) 20 MG tablet Take 2 tablets (40 mg total) by mouth daily.   vitamin B-12 (CYANOCOBALAMIN) 1000 MCG tablet Take 1,000 mcg by mouth every Monday, Wednesday, and Friday.     Allergies:   Shellfish allergy, Tape, Sulfa antibiotics, and Trazodone and nefazodone   Social History   Socioeconomic History   Marital status: Married    Spouse name: Not on file   Number of children: 4   Years of education: Not on file   Highest education level: Not on file  Occupational History   Occupation: retired  Tobacco Use   Smoking status: Former   Smokeless tobacco: Former    Types: Associate Professor Use: Never used  Substance and Sexual Activity   Alcohol use: Never   Drug use: Never   Sexual activity: Not on file  Other Topics Concern   Not on file  Social History Narrative   Not on file    Social Determinants of Health   Financial Resource Strain: Not on file  Food Insecurity: Not on file  Transportation Needs: Not on file  Physical Activity: Not on file  Stress: Not on file  Social Connections: Not on file     Family History: The patient's family history includes Atrial fibrillation in his brother; Breast cancer in his daughter; CVA in his father; Dementia in his sister and sister; Kidney cancer in his daughter; Other in his mother; Pancreatic cancer in his brother; Stomach cancer in his brother.  ROS:   Review of Systems  Constitutional:  Negative for chills and fever.  HENT:  Negative for ear pain and sinus pain.   Eyes:  Negative for photophobia.  Respiratory:  Negative for cough and sputum production.   Cardiovascular:  Positive for leg swelling. Negative for chest pain, palpitations, orthopnea, claudication and PND.  Gastrointestinal:  Negative for constipation and nausea.  Genitourinary:  Negative for dysuria.  Musculoskeletal:  Positive for falls and joint pain. Negative for neck pain.  Neurological:  Positive for dizziness. Negative for loss of consciousness and weakness.  Endo/Heme/Allergies:  Negative for polydipsia.  Psychiatric/Behavioral:  Negative for depression and suicidal ideas.      EKGs/Labs/Other Studies Reviewed:    The following studies were reviewed today:  Cardiac Monitor 01/2020: Predominant rhythm was sinus rhythm.  Atrial fibrillation also noted, rapid response 89%.  PVCs and PACs noted.  PVC burden 4%.  TTE 12/2018: IMPRESSIONS    1. Left ventricular ejection fraction, by visual estimation, is 65 to  70%. The left ventricle has hyperdynamic function. There is no left  ventricular hypertrophy.   2. Left ventricular diastolic function could not be evaluated.   3. The left ventricle has no regional wall motion abnormalities.   4. Global right ventricle has normal systolic function.The right  ventricular size is normal. No  increase in right ventricular wall  thickness.   5. Left atrial size was normal.   6. Right atrial size was mildly dilated.   7. The mitral valve is normal in structure. Trivial mitral valve  regurgitation. No evidence of mitral stenosis.   8. The tricuspid valve is normal in structure.   9. The aortic valve is normal in structure. Aortic valve regurgitation is  not visualized. Mild to moderate aortic valve sclerosis/calcification  without any evidence of aortic stenosis.  10. The pulmonic valve was normal in structure. Pulmonic valve  regurgitation is not visualized.  11. Moderately elevated pulmonary artery systolic pressure.  12. The tricuspid regurgitant velocity is 2.91 m/s, and with an assumed  right atrial pressure of 8 mmHg, the estimated right ventricular systolic  pressure is moderately elevated at 42.0 mmHg.  13. The inferior vena cava is normal in size with greater than 50%  respiratory variability, suggesting right atrial pressure of 3 mmHg.  14. Since the last study on 10/01/2017 LVEF has improved from 45-50% to  65-70%.   Cath 10/2017: Ost 2nd Mrg to 2nd Mrg lesion is 30% stenosed. Prox LAD to Mid LAD lesion is 100% stenosed.   1.  Total occlusion of the LAD just after the first diagonal branch 2.  Minor nonobstructive disease involving the left main, left circumflex, and RCA   Recommendation: Consider CTO-PCI of the LAD.  There is a well-formed epicardial collateral from the obtuse marginal branch to the LAD and the length of the total occlusion appears to be fairly short.  Will refer to Dr Eldridge Dace or [place].   Resume apixaban tomorrow.    Coronary CTA 10/01/17: FINDINGS: Non-cardiac: See separate report from [facility].   Pulmonary veins drain normally to the left atrium.   Calcium Score: 284 Agatston units.   Coronary Arteries: Right dominant with no anomalies   LM: Calcified plaque distal left main, no significant stenosis.   LAD system: The  proximal LAD is occluded after 2 small caliber diagonals. There is some filling of the distal LAD by collaterals.   Circumflex system: Mixed plaque proximal LCx, no signiificant stenosis. Mixed plaque proximal OM1, mild stenosis.   RCA system: Dominant  but relatively small caliber. Calcified plaque distal RCA, mild stenosis.   IMPRESSION: 1. Coronary artery calcium score 284 Agatston units, placing the patient in the 35th percentile for age and gender.   2. The proximal LAD is occluded after 2 small diagonals. There is some filling of the distal LAD by collaterals.   TTE 10/01/17: Study Conclusions   - Left ventricle: The cavity size was normal. There was mild    concentric hypertrophy. Systolic function was mildly reduced. The    estimated ejection fraction was in the range of 45% to 50%.    Diffuse hypokinesis.  - Aortic valve: Transvalvular velocity was within the normal range.    There was no stenosis. There was no regurgitation.  - Mitral valve: Moderately calcified annulus. Calcification.    Transvalvular velocity was within the normal range. There was no    evidence for stenosis. There was trivial regurgitation.  - Left atrium: The atrium was moderately dilated.  - Right ventricle: The cavity size was normal. Wall thickness was    normal. Systolic function was normal.  - Tricuspid valve: There was mild regurgitation.  - Pulmonary arteries: Systolic pressure was within the normal    range. PA peak pressure: 18 mm Hg (S).   EKG:  EKG is personally reviewed. 07/07/2021:  EKG was not ordered. 03/31/2021 (Dr. Elberta Fortis): Sinus rhythm.  Recent Labs: 03/31/2021: BUN 16; Creatinine, Ser 1.51; Potassium 3.9; Sodium 140 12/08/2021: Magnesium 2.1   Recent Lipid Panel No results found for: "CHOL", "TRIG", "HDL", "CHOLHDL", "VLDL", "LDLCALC", "LDLDIRECT"   Risk Assessment/Calculations:    CHA2DS2-VASc Score = 5  This indicates a 7.2% annual risk of stroke. The patient's score is  based upon: CHF History: 1 HTN History: 1 Diabetes History: 0 Stroke History: 0 Vascular Disease History: 1 Age Score: 2 Gender Score: 0       Physical Exam:    VS:  BP 110/72   Pulse 71   Ht 5\' 6"  (1.676 m)   Wt 184 lb 6.4 oz (83.6 kg)   SpO2 95%   BMI 29.76 kg/m     Wt Readings from Last 3 Encounters:  02/23/22 184 lb 6.4 oz (83.6 kg)  12/08/21 189 lb 3.2 oz (85.8 kg)  07/07/21 189 lb 6.4 oz (85.9 kg)     GEN: Elderly male, comfortable HEENT: Normal NECK: No JVD; No carotid bruits CARDIAC: RRR,1/6 soft systolic murmur, No rubs, No gallops RESPIRATORY:  Clear to auscultation without rales, wheezing or rhonchi  ABDOMEN: Soft, non-tender, non-distended MUSCULOSKELETAL:  No edema; No deformity  SKIN: Warm and dry NEUROLOGIC:  Alert and oriented x 3 PSYCHIATRIC:  Normal affect   ASSESSMENT:    1. Chronic diastolic heart failure (HCC)   2. Primary hypertension   3. Secondary hypercoagulable state (HCC)   4. Coronary artery disease involving native coronary artery of native heart without angina pectoris   5. Persistent atrial fibrillation (HCC)   6. Gross hematuria    PLAN:    In order of problems listed above:  #Chronic HF with Improved EF: Patient with history of mildly reduced LVEF on TTE in 2019 at 45-50%. Improved to 65-70% in 2020. Currently with NYHA class II-III symptoms.  -Continue torsemide 40mg  daily -Continue metolazone 2.5mg  on Sat -On Potassium supplementation daily -Continue metop 25mg  XL daily -Continue to work on low Na -Monitor weights and LE edema  #CAD: Cath 2019 with CTO of prox LAD with filling of the distal LAD with collaterals. Has been managed  medically. No significant chest pain. Given age and overall stability, no plans for further ischemic evaluation at this time. -Continue ASA 81mg  daily -Continue metop 25mg  XL daily -Continue imdur 15mg  daily  #Persistent Afib: CHADs-vasc 5. Not on AC due to frequent falls. Currently  maintaining NSR on dofetilide. Followed by Dr. Curt Bears -Continue dofetilide 2105mcg daily -Off apixaban due to frequent falls -Continue ASA 81mg  daily  #HTN: Well controlled.  -Continue metop 25mg  XL daily as above -Continue imdur 15mg  daily  #Hematuria: #Bladder Tumors: Planned for surgical removal with Urology. Overall, he is clinically compensated from a HF standpoint with no further medication optimization needed prior to OR. No need for ischemic testing as patient has no anginal symptoms and given advanced age, would manage medically at this time. He is overall high risk due to age and comorbidities but these risks cannot be further mitigated. Revised cardiac risk index 10.1%. -Plan for OR with Urology -No further CV testing prior to OR        Follow-up:  6 months.  Medication Adjustments/Labs and Tests Ordered: Current medicines are reviewed at length with the patient today.  Concerns regarding medicines are outlined above.   No orders of the defined types were placed in this encounter.  No orders of the defined types were placed in this encounter.  Patient Instructions  Medication Instructions:   Your physician recommends that you continue on your current medications as directed. Please refer to the Current Medication list given to you today.  *If you need a refill on your cardiac medications before your next appointment, please call your pharmacy*   Follow-Up: At [facility], you and your health needs are our priority.  As part of our continuing mission to provide you with exceptional heart care, we have created designated Provider Care Teams.  These Care Teams include your primary Cardiologist (physician) and Advanced Practice Providers (APPs -  Physician Assistants and Nurse Practitioners) who all work together to provide you with the care you need, when you need it.  We recommend signing up for the patient portal called "MyChart".  Sign up information is  provided on this After Visit Summary.  MyChart is used to connect with patients for Virtual Visits (Telemedicine).  Patients are able to view lab/test results, encounter notes, upcoming appointments, etc.  Non-urgent messages can be sent to your provider as well.   To learn more about what you can do with MyChart, go to [email or web address].    Your next appointment:   6 month(s)  Provider:   Dr. Johney Frame     Signed, Freada Bergeron, MD  02/23/2022 12:36 PM    [place]

## 2022-02-12 NOTE — Telephone Encounter (Signed)
[facility] called to talk with triage in regards to a possible interaction with two prescription drugs the patient has been prescribed: metolazone (ZAROXOLYN) 2.5 MG tablet and torsemide (DEMADEX) 20 MG tablet .Marland Kitchen..please call back to discuss

## 2022-02-13 ENCOUNTER — Telehealth: Payer: Self-pay | Admitting: Cardiology

## 2022-02-13 NOTE — Telephone Encounter (Signed)
Returned call for the second time this morning. Advised that yes we are aware and approve the splitting of this tablet. [facility] appreciates the return call.

## 2022-02-13 NOTE — Telephone Encounter (Signed)
Pt c/o medication issue:  1. Name of Medication: isosorbide mononitrate (IMDUR) 30 MG 24 hr tablet   2. How are you currently taking this medication (dosage and times per day)?   3. Are you having a reaction (difficulty breathing--STAT)?   4. What is your medication issue? Ref # [id number]   Pharmacy is calling to get clarification on medication. She states that instructions say to take 1 1/2 tab, but it is not suggested to cut pills in half. Requesting return call.

## 2022-02-13 NOTE — Telephone Encounter (Signed)
Spoke to [facility]. Spent 29 min on the phone, most of it on hold. Informed that we are aware pt is on both medications, which he has been on for several years.

## 2022-02-23 ENCOUNTER — Ambulatory Visit: Payer: Medicare Other | Attending: Cardiology | Admitting: Cardiology

## 2022-02-23 ENCOUNTER — Encounter: Payer: Self-pay | Admitting: Cardiology

## 2022-02-23 VITALS — BP 110/72 | HR 71 | Ht 66.0 in | Wt 184.4 lb

## 2022-02-23 DIAGNOSIS — R310 Gross hematuria: Secondary | ICD-10-CM | POA: Insufficient documentation

## 2022-02-23 DIAGNOSIS — I10 Essential (primary) hypertension: Secondary | ICD-10-CM | POA: Diagnosis present

## 2022-02-23 DIAGNOSIS — D6869 Other thrombophilia: Secondary | ICD-10-CM | POA: Insufficient documentation

## 2022-02-23 DIAGNOSIS — I4819 Other persistent atrial fibrillation: Secondary | ICD-10-CM | POA: Diagnosis present

## 2022-02-23 DIAGNOSIS — I2510 Atherosclerotic heart disease of native coronary artery without angina pectoris: Secondary | ICD-10-CM | POA: Insufficient documentation

## 2022-02-23 DIAGNOSIS — I5032 Chronic diastolic (congestive) heart failure: Secondary | ICD-10-CM | POA: Insufficient documentation

## 2022-02-23 NOTE — Patient Instructions (Signed)
Medication Instructions:   Your physician recommends that you continue on your current medications as directed. Please refer to the Current Medication list given to you today.  *If you need a refill on your cardiac medications before your next appointment, please call your pharmacy*    Follow-Up: At [facility], you and your health needs are our priority.  As part of our continuing mission to provide you with exceptional heart care, we have created designated Provider Care Teams.  These Care Teams include your primary Cardiologist (physician) and Advanced Practice Providers (APPs -  Physician Assistants and Nurse Practitioners) who all work together to provide you with the care you need, when you need it.  We recommend signing up for the patient portal called "MyChart".  Sign up information is provided on this After Visit Summary.  MyChart is used to connect with patients for Virtual Visits (Telemedicine).  Patients are able to view lab/test results, encounter notes, upcoming appointments, etc.  Non-urgent messages can be sent to your provider as well.   To learn more about what you can do with MyChart, go to [email or web address].    Your next appointment:   6 month(s)  Provider:   Dr. Pemberton   

## 2022-02-25 ENCOUNTER — Encounter: Payer: Self-pay | Admitting: Cardiology

## 2022-02-26 ENCOUNTER — Encounter: Payer: Self-pay | Admitting: Cardiology

## 2022-02-27 ENCOUNTER — Telehealth: Payer: Self-pay

## 2022-02-27 NOTE — Telephone Encounter (Signed)
Signed paperwork faxed 

## 2022-02-27 NOTE — Telephone Encounter (Signed)
**Note De-Identified Ronnie Torres Obfuscation** The pts [employer] application that we faxed to them on 02/11/2022 was unreadable. [name], the pts daughter and DPR brought [place] a better copy today.  I have completed the providers page of the application with our DOD, Dr Hassell Done information (in Dr Macky Lower absence) and have e-mailed all to his nurse so she can obtain his signature and date on the first 2 pages after the cover letter.

## 2022-03-06 NOTE — Telephone Encounter (Signed)
Called pt and spoke with pt's daughter [name], informing them that pt's medication Tikosyn 250 mcg capsules, from [employer] patient assistance program,  were at [employer] office at the front desk for them to pick up. Daughter verbalized understanding.   3 bottles of 60 capsules.  Lot# [id number]   Exp: 02/2023

## 2022-03-12 NOTE — Telephone Encounter (Signed)
**Note De-Identified Ronnie Torres Obfuscation** Letter received from [employer] Marjorie Deprey fax stating that they have approved the pt for Tikosyn assistance until 01/26/2023. ID#: [id number]  The pt is aware of this approval.

## 2022-04-03 ENCOUNTER — Encounter: Payer: Self-pay | Admitting: Cardiology

## 2022-04-03 NOTE — Telephone Encounter (Signed)
Turned my chart encounter into a phone note. Daughter actually had questions about results for patient's wife, her mother.

## 2022-05-18 ENCOUNTER — Encounter: Payer: Self-pay | Admitting: Cardiology

## 2022-05-19 ENCOUNTER — Telehealth: Payer: Self-pay

## 2022-05-19 NOTE — Telephone Encounter (Signed)
**Note De-Identified Ronnie Torres Obfuscation** Per the pts daughter and DPR, Rush Barer message I called [employer] at [phone]) and ordered the pts next Tikosyn shipment with [employer]. Pt ID #: [id number]   Per Savey the pts Tikosyn shipment will arrive at the office within 7 to 10 business days from today.   Order ID # is: [id number]   I have sent the pt and his daughter, Ronnie Torres a ESLPNPY message advising that I have ordered his next Tikosyn shipment through [employer] and that we will notify them when it arrives at the office.

## 2022-05-22 NOTE — Telephone Encounter (Signed)
Called pt and spoke with Pt's daughter [name], informing them that pt's medication Tikosyn 250 mcg capsules from [employer] patient assistance, 3 bottles of 60 capsules, are at [employer] front desk for them to pick up. I advised if they have any other problems, questions or concerns, to give our office a call. Daughter verbalized understanding.   Lot#  [id number]   EXP: 02/2023  FYI

## 2022-06-01 ENCOUNTER — Encounter: Payer: Self-pay | Admitting: Cardiology

## 2022-06-15 ENCOUNTER — Ambulatory Visit: Payer: Medicare Other | Attending: Cardiology | Admitting: Cardiology

## 2022-06-15 ENCOUNTER — Encounter: Payer: Self-pay | Admitting: Cardiology

## 2022-06-15 VITALS — BP 110/76 | HR 59 | Ht 66.0 in | Wt 188.0 lb

## 2022-06-15 DIAGNOSIS — Z79899 Other long term (current) drug therapy: Secondary | ICD-10-CM | POA: Diagnosis present

## 2022-06-15 DIAGNOSIS — I2510 Atherosclerotic heart disease of native coronary artery without angina pectoris: Secondary | ICD-10-CM

## 2022-06-15 DIAGNOSIS — I4819 Other persistent atrial fibrillation: Secondary | ICD-10-CM

## 2022-06-15 DIAGNOSIS — D6869 Other thrombophilia: Secondary | ICD-10-CM

## 2022-06-15 NOTE — Patient Instructions (Signed)
Medication Instructions:  °Your physician recommends that you continue on your current medications as directed. Please refer to the Current Medication list given to you today. ° °*If you need a refill on your cardiac medications before your next appointment, please call your pharmacy* ° ° °Lab Work: °None ordered ° ° °Testing/Procedures: °None ordered ° ° °Follow-Up: °At [facility], you and your health needs are our priority.  As part of our continuing mission to provide you with exceptional heart care, we have created designated Provider Care Teams.  These Care Teams include your primary Cardiologist (physician) and Advanced Practice Providers (APPs -  Physician Assistants and Nurse Practitioners) who all work together to provide you with the care you need, when you need it. ° °Your next appointment:   °6 month(s) ° °The format for your next appointment:   °In Person ° °Provider:   °Will [name], MD ° ° ° °Thank you for choosing [facility]!! ° ° °Trigger Frasier, RN °[phone] °  °

## 2022-06-15 NOTE — Progress Notes (Signed)
Electrophysiology Office Note   Date:  06/15/2022   ID:  Ronnie Torres, Ronnie Torres [birth year], MRN [id number]  PCP:  Ronnie Like, MD  Cardiologist:   Primary Electrophysiologist:  Ronnie Interrante Jorja Loa, MD    No chief complaint on file.     History of Present Illness: Ronnie Torres is a 89 y.o. male who is being seen today for the evaluation of atrial fibrillation at the request of Ronnie Torres. Presenting today for electrophysiology evaluation.    He has a history significant for persistent atrial fibrillation, COPD, coronary artery disease.  He was initially on amiodarone but did not stay in normal rhythm.  He has been switched to dofetilide.  He had a left heart catheterization that showed an occluded LAD and no intervention was performed.  He is not anticoagulated due to history of frequent falls.  Today, denies symptoms of palpitations, chest pain, shortness of breath, orthopnea, PND, lower extremity edema, claudication, dizziness, presyncope, syncope, bleeding, or neurologic sequela. The patient is tolerating medications without difficulties.  Since being seen he does not think that he has been in atrial fibrillation.  He is continue to have intermittent falls when he bends over.  This does not occur when he stands back up, he simply loses his balance.  He also recently had bladder surgery and had quite a bit of bleeding post surgery.    Past Medical History:  Diagnosis Date   Acid reflux    Anemia    Arthritis    Atrial fibrillation (HCC)    BPH (benign prostatic hyperplasia)    CAD (coronary artery disease)    Colon polyps    COPD (chronic obstructive pulmonary disease) (HCC)    COVID-19    Diverticulitis    Diverticulosis    Emphysema lung (HCC)    GERD (gastroesophageal reflux disease)    Gout    Hemorrhoids    Hiatal hernia    Sleep apnea    CPAP   Status post dilation of esophageal narrowing    Past Surgical History:  Procedure Laterality Date   CARDIOVERSION N/A  12/01/2017   Procedure: CARDIOVERSION;  Surgeon: Jake Bathe, MD;  Location: [facility] ENDOSCOPY;  Service: Cardiovascular;  Laterality: N/A;   CORONARY ANGIOGRAPHY N/A 11/02/2017   Procedure: CORONARY ANGIOGRAPHY (CATH LAB);  Surgeon: Tonny Bollman, MD;  Location: [facility] INVASIVE CV LAB;  Service: Cardiovascular;  Laterality: N/A;   HEMORROIDECTOMY     KIDNEY STONE SURGERY     Basket     Current Outpatient Medications  Medication Sig Dispense Refill   acetaminophen (TYLENOL) 500 MG tablet Take 1,000 mg by mouth every 6 (six) hours as needed for moderate pain.     albuterol (PROVENTIL HFA;VENTOLIN HFA) 108 (90 Base) MCG/ACT inhaler Inhale 2 puffs into the lungs every 4 (four) hours as needed for wheezing or shortness of breath.      allopurinol (ZYLOPRIM) 100 MG tablet Take 100 mg by mouth 2 (two) times daily.     aspirin EC (ASPIRIN ADULT LOW STRENGTH) 81 MG tablet Take 81 mg by mouth daily.     budesonide-formoterol (SYMBICORT) 80-4.5 MCG/ACT inhaler Inhale 2 puffs into the lungs as directed.     colchicine 0.6 MG tablet Take 0.6 mg by mouth daily as needed.     dofetilide (TIKOSYN) 250 MCG capsule Take 1 capsule (250 mcg total) by mouth 2 (two) times daily. 180 capsule 3   donepezil (ARICEPT) 5 MG tablet Take 5 mg by mouth at bedtime.  ferrous sulfate 325 (65 FE) MG tablet Take 648 mg by mouth every Monday, Wednesday, and Friday.     gabapentin (NEURONTIN) 100 MG capsule Take 100 mg by mouth 2 (two) times daily.      guaiFENesin (MUCINEX) 600 MG 12 hr tablet Take 600 mg by mouth as needed.     isosorbide mononitrate (IMDUR) 30 MG 24 hr tablet TAKE ONE-HALF (1/2) TABLET DAILY 45 tablet 3   Menthol, Topical Analgesic, 8 % LIQD Apply 1 application topically 2 (two) times daily as needed (joint pain).     metolazone (ZAROXOLYN) 2.5 MG tablet TAKE 1 TABLET EVERY SATURDAY 12 tablet 3   metoprolol succinate (TOPROL XL) 25 MG 24 hr tablet Take 1 tablet (25 mg total) by mouth daily. 90 tablet 3    polyethylene glycol (MIRALAX / GLYCOLAX) 17 g packet Take 17 g by mouth daily as needed for moderate constipation.     potassium chloride SA (KLOR-CON M) 20 MEQ tablet Take 3 tablets (60 mEq total) by mouth daily. 270 tablet 3   PROTONIX 20 MG tablet Take 20 mg by mouth daily.     senna-docusate (SENOKOT-S) 8.6-50 MG tablet Take 1 tablet by mouth at bedtime as needed for mild constipation.     sertraline (ZOLOFT) 50 MG tablet Take 50 mg by mouth at bedtime.     tamsulosin (FLOMAX) 0.4 MG CAPS capsule Take 0.4 mg by mouth at bedtime.     torsemide (DEMADEX) 20 MG tablet Take 2 tablets (40 mg total) by mouth daily. 180 tablet 3   vitamin B-12 (CYANOCOBALAMIN) 1000 MCG tablet Take 1,000 mcg by mouth every Monday, Wednesday, and Friday.     omeprazole (PRILOSEC) 20 MG capsule Take 20 mg by mouth 2 (two) times daily.      No current facility-administered medications for this visit.    Allergies:   Diphenhydramine hcl, Shrimp extract, Wound dressing adhesive, Shellfish allergy, Tape, Sulfa antibiotics, and Trazodone and nefazodone   Social History:  The patient  reports that he has quit smoking. He has quit using smokeless tobacco.  His smokeless tobacco use included chew. He reports that he does not drink alcohol and does not use drugs.   Family History:  The patient's family history includes Atrial fibrillation in his brother; Breast cancer in his daughter; CVA in his father; Dementia in his sister and sister; Kidney cancer in his daughter; Other in his mother; Pancreatic cancer in his brother; Stomach cancer in his brother.   ROS:  Please see the history of present illness.   Otherwise, review of systems is positive for none.   All other systems are reviewed and negative.   PHYSICAL EXAM: VS:  BP 110/76   Pulse (!) 59   Ht 5\' 6"  (1.676 m)   Wt 188 lb (85.3 kg)   SpO2 95%   BMI 30.34 kg/m  , BMI Body mass index is 30.34 kg/m. GEN: Well nourished, well developed, in no acute distress   HEENT: normal  Neck: no JVD, carotid bruits, or masses Cardiac: RRR; no murmurs, rubs, or gallops,no edema  Respiratory:  clear to auscultation bilaterally, normal work of breathing GI: soft, nontender, nondistended, + BS MS: no deformity or atrophy  Skin: warm and dry Neuro:  Strength and sensation are intact Psych: euthymic mood, full affect  EKG:  EKG is ordered today. Personal review of the ekg ordered shows sinus rhythm, first-degree AV block, rate 59  Recent Labs: 12/08/2021: Magnesium 2.1    Lipid  Panel  No results found for: "CHOL", "TRIG", "HDL", "CHOLHDL", "VLDL", "LDLCALC", "LDLDIRECT"   Wt Readings from Last 3 Encounters:  06/15/22 188 lb (85.3 kg)  02/23/22 184 lb 6.4 oz (83.6 kg)  12/08/21 189 lb 3.2 oz (85.8 kg)      Other studies Reviewed: Additional studies/ records that were reviewed today include: Myoview 06/29/2017 Review of the above records today demonstrates:  No inducible ischemia No wall motion abnormalities Normal left ventricular function  TTE 10/01/17 - Left ventricle: The cavity size was normal. There was mild   concentric hypertrophy. Systolic function was mildly reduced. The   estimated ejection fraction was in the range of 45% to 50%.   Diffuse hypokinesis. - Aortic valve: Transvalvular velocity was within the normal range.   There was no stenosis. There was no regurgitation. - Mitral valve: Moderately calcified annulus. Calcification.   Transvalvular velocity was within the normal range. There was no   evidence for stenosis. There was trivial regurgitation. - Left atrium: The atrium was moderately dilated. - Right ventricle: The cavity size was normal. Wall thickness was   normal. Systolic function was normal. - Tricuspid valve: There was mild regurgitation. - Pulmonary arteries: Systolic pressure was within the normal   range. PA peak pressure: 18 mm Hg (S).  LHC 11/02/17 Ost 2nd Mrg to 2nd Mrg lesion is 30% stenosed. Prox LAD to  Mid LAD lesion is 100% stenosed.   1.  Total occlusion of the LAD just after the first diagonal branch 2.  Minor nonobstructive disease involving the left main, left circumflex, and RCA  Cardiac monitor 02/20/2020 personally reviewed Predominant rhythm was sinus rhythm.  Atrial fibrillation also noted, rapid response 89%.  PVCs and PACs noted.  PVC burden 4%.    ASSESSMENT AND PLAN:  1.  Persistent atrial fibrillation: Currently on dofetilide.  CHA2DS2-VASc of 3.  Not anticoagulated due to frequent falls.  He remains in sinus rhythm.  Continue with current management.  2.  Coronary artery disease: Chronically occluded LAD.  No current chest pain.  Continue metoprolol and Imdur.  3.  Secondary to coagula state: Not anticoagulated for atrial fibrillation due to history of falls  4.  Chronic diastolic heart failure: Has lower extremity edema.  Eats quite a bit of salt and has dietary noncompliance.  Continue with current management.  5.  High risk medication monitoring: Currently on dofetilide.  Recent labs within normal limits.   Current medicines are reviewed at length with the patient today.   The patient does not have concerns regarding his medicines.  The following changes were made today: None  Labs/ tests ordered today include:  Orders Placed This Encounter  Procedures   EKG 12-Lead      Disposition:   FU 6 months  Signed, Johnsie Moscoso Jorja Loa, MD  06/15/2022 10:30 AM     [facility] [address] [place] [address] [phone] (office) [phone] (fax)

## 2022-08-10 ENCOUNTER — Encounter: Payer: Self-pay | Admitting: Cardiology

## 2022-08-10 ENCOUNTER — Telehealth: Payer: Self-pay

## 2022-08-10 NOTE — Telephone Encounter (Signed)
Patient's daughter [name] dropped off [employer] to be filled out. Left in red folder.

## 2022-08-11 NOTE — Telephone Encounter (Signed)
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 She stated that they attempted to add Dr Gershon Crane provider part but was unable. She did advise me that she would be dropping off forms that she printed from [employer]'s web pg in hopes that that would be all that is needed.  I called [employer] and s/w Britta Mccreedy who advised me that pts can request a paper application by calling them and requesting that one be mailed to them. She offered to fax me an application that would include a providers page that I can complete, have Dr Elberta Fortis sign and date and to then fax it back to them with the pts name, DOB, [employer] ID, and a note asking them to merge the providers page with the pts part of his application that was completed online.  I have completed the application/cover letter and I have e-mailed it to Dr Gershon Crane nurse so she can obtain his signature, date it, and so she can then fax to [employer] at the fax number written on the cover letter included.

## 2022-08-11 NOTE — Telephone Encounter (Signed)
Pt's information was scanned to [facility], [employer]. FYI

## 2022-08-20 NOTE — Telephone Encounter (Signed)
Called pt and spoke with pt's daughter Ronnie Torres, informing them that pt's medication from [employer] patient assistance program, Tikosyn 250 mcg capsules, were at [facility], front desk for them to pick up. I advised the daughter that if they have any other problems, questions or concerns, to give our office a call. Daughter verbalized understanding.    Lot# [id number]  Exp:;  02/2023

## 2022-08-24 ENCOUNTER — Ambulatory Visit: Payer: Medicare Other | Admitting: Cardiology

## 2022-08-31 ENCOUNTER — Encounter: Payer: Self-pay | Admitting: Cardiology

## 2022-09-11 ENCOUNTER — Encounter: Payer: Self-pay | Admitting: Cardiology

## 2022-09-14 NOTE — Telephone Encounter (Signed)
Followed up w/ dtr. Reports dad is not doing well, hasn't eaten since Friday. She will update [place] later to determine next steps depending on his outcome, currently in hospice care. [name] appreciates my follow up/concern.

## 2022-09-23 ENCOUNTER — Telehealth: Payer: Self-pay | Admitting: Cardiology

## 2022-09-23 NOTE — Telephone Encounter (Signed)
Daughter [name]) called to report patient passed away on October 12, 2022.

## 2022-09-27 DEATH — deceased

## 2022-10-26 ENCOUNTER — Ambulatory Visit (HOSPITAL_BASED_OUTPATIENT_CLINIC_OR_DEPARTMENT_OTHER): Payer: Medicare Other | Admitting: Cardiovascular Disease

## 2022-12-14 ENCOUNTER — Ambulatory Visit: Payer: Medicare Other | Admitting: Cardiology

## 2023-01-11 ENCOUNTER — Ambulatory Visit: Payer: Medicare Other | Admitting: Cardiology
